# Patient Record
Sex: Female | Born: 1985 | Race: White | Hispanic: No | Marital: Married | Smoking: Current every day smoker
Health system: Southern US, Community
[De-identification: ages and names within clinical notes are randomized; demographics above are authoritative.]

## PROBLEM LIST (undated history)

## (undated) DIAGNOSIS — Z789 Other specified health status: Secondary | ICD-10-CM

## (undated) DIAGNOSIS — J4540 Moderate persistent asthma, uncomplicated: Secondary | ICD-10-CM

## (undated) DIAGNOSIS — F419 Anxiety disorder, unspecified: Secondary | ICD-10-CM

## (undated) DIAGNOSIS — D751 Secondary polycythemia: Secondary | ICD-10-CM

## (undated) DIAGNOSIS — J45909 Unspecified asthma, uncomplicated: Secondary | ICD-10-CM

## (undated) DIAGNOSIS — T7840XA Allergy, unspecified, initial encounter: Secondary | ICD-10-CM

## (undated) DIAGNOSIS — Z3009 Encounter for other general counseling and advice on contraception: Secondary | ICD-10-CM

## (undated) HISTORY — DX: Anxiety disorder, unspecified: F41.9

## (undated) HISTORY — DX: Unspecified asthma, uncomplicated: J45.909

## (undated) HISTORY — DX: Allergy, unspecified, initial encounter: T78.40XA

## (undated) HISTORY — PX: NO PAST SURGERIES: SHX2092

## (undated) HISTORY — DX: Secondary polycythemia: D75.1

## (undated) HISTORY — DX: Encounter for other general counseling and advice on contraception: Z30.09

## (undated) HISTORY — DX: Other specified health status: Z78.9

---
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
## 2013-05-22 ENCOUNTER — Encounter: Payer: Self-pay | Admitting: Women's Health

## 2013-07-10 ENCOUNTER — Ambulatory Visit (INDEPENDENT_AMBULATORY_CARE_PROVIDER_SITE_OTHER): Payer: BC Managed Care – PPO | Admitting: Family Medicine

## 2013-07-10 VITALS — BP 110/64 | HR 79 | Temp 97.9°F | Resp 18 | Ht 67.5 in | Wt 171.0 lb

## 2013-07-10 DIAGNOSIS — R52 Pain, unspecified: Secondary | ICD-10-CM

## 2013-07-10 DIAGNOSIS — L0231 Cutaneous abscess of buttock: Secondary | ICD-10-CM

## 2013-07-10 MED ORDER — DOXYCYCLINE HYCLATE 100 MG PO TABS: 100.0000 mg | ORAL_TABLET | Freq: Two times a day (BID) | ORAL | Status: DC

## 2013-07-10 NOTE — Progress Notes (Signed)
Procedure Note: Verbal consent obtained.  Local anesthesia with 2 cc 2% lidocaine  Small incision with 11 blade.  Small amount purulence expressed.  Culture collected Wound irrigated with remaining anesthetic.  No packing used.  Cleansed and dressed.  Discussed wound care.  Encouraged frequent warm compresses and dressing changes.

## 2013-07-10 NOTE — Patient Instructions (Addendum)
Abscess An abscess is an infected area that contains a collection of pus and debris.It can occur in almost any part of the body. An abscess is also known as a furuncle or boil. CAUSES  An abscess occurs when tissue gets infected. This can occur from blockage of oil or sweat glands, infection of hair follicles, or a minor injury to the skin. As the body tries to fight the infection, pus collects in the area and creates pressure under the skin. This pressure causes pain. People with weakened immune systems have difficulty fighting infections and get certain abscesses more often.  SYMPTOMS Usually an abscess develops on the skin and becomes a painful mass that is red, warm, and tender. If the abscess forms under the skin, you may feel a moveable soft area under the skin. Some abscesses break open (rupture) on their own, but most will continue to get worse without care. The infection can spread deeper into the body and eventually into the bloodstream, causing you to feel ill.  DIAGNOSIS  Your caregiver will take your medical history and perform a physical exam. A sample of fluid may also be taken from the abscess to determine what is causing your infection. TREATMENT  Your caregiver may prescribe antibiotic medicines to fight the infection. However, taking antibiotics alone usually does not cure an abscess. Your caregiver may need to make a small cut (incision) in the abscess to drain the pus. In some cases, gauze is packed into the abscess to reduce pain and to continue draining the area. HOME CARE INSTRUCTIONS   Only take over-the-counter or prescription medicines for pain, discomfort, or fever as directed by your caregiver.  If you were prescribed antibiotics, take them as directed. Finish them even if you start to feel better.  If gauze is used, follow your caregiver's directions for changing the gauze.  To avoid spreading the infection:  Keep your draining abscess covered with a  bandage.  Wash your hands well.  Do not share personal care items, towels, or whirlpools with others.  Avoid skin contact with others.  Keep your skin and clothes clean around the abscess.  Keep all follow-up appointments as directed by your caregiver. SEEK MEDICAL CARE IF:   You have increased pain, swelling, redness, fluid drainage, or bleeding.  You have muscle aches, chills, or a general ill feeling.  You have a fever. MAKE SURE YOU:   Understand these instructions.  Will watch your condition.  Will get help right away if you are not doing well or get worse. Document Released: 09/16/2005 Document Revised: 06/07/2012 Document Reviewed: 02/19/2012 Exit[employer] Patient Information 2014 [employer].  

## 2013-07-10 NOTE — Progress Notes (Signed)
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 She's had a history of staph infections in the past. She presents with about 5 days of progressive left gluteal pain and swelling consistent with staph infection. She also has a secondary satellite lesion 4 inches away. That was not as tender.  Her job as a Primary school teacher job.  Objective: 1 cm abscess left mid gluteal area. This is tender and appears to be ready to be lanced. He was awake 10 point area in the center of the erythematous swelling.  Assessment: Recurrent staph infections  Plan: Doxycycline 100 mg twice a day times 7 days, one refill progress signed, Sheila Oats.D.

## 2013-07-13 LAB — WOUND CULTURE: Gram Stain: NONE SEEN

## 2013-07-13 MED ORDER — CEPHALEXIN 500 MG PO CAPS: 500.0000 mg | ORAL_CAPSULE | Freq: Three times a day (TID) | ORAL | Status: AC

## 2013-07-13 NOTE — Addendum Note (Signed)
Addended by: Morrell Riddle on: 07/13/2013 08:28 PM   Modules accepted: Orders

## 2013-07-20 ENCOUNTER — Telehealth: Payer: Self-pay

## 2013-07-20 NOTE — Telephone Encounter (Signed)
Pt states that she is currently taking an antibiotic for a boil that she has, pt states that the antiobiotic is making her feel "odd" and "spacey". She would like to know if she should come in for this or should she just try a different medication. Best# [phone]

## 2013-07-20 NOTE — Telephone Encounter (Signed)
Please advise 

## 2013-07-21 NOTE — Telephone Encounter (Signed)
How does her wound look? It looks like she was changed from doxycycline to Keflex on 7/25 and she reported it was much better. Did she start the Keflex on 7/25? If so she has had 7 days of this and should be ok.  If it has completely resolved she can stop the antibiotic.

## 2013-07-22 NOTE — Telephone Encounter (Signed)
Spoke with patient and wound looks great. She did take 7-8 days worth of keflex so instructed her to d/c

## 2013-07-22 NOTE — Telephone Encounter (Signed)
Left message to return call 

## 2013-07-31 ENCOUNTER — Telehealth: Payer: Self-pay

## 2013-07-31 ENCOUNTER — Other Ambulatory Visit: Payer: Self-pay

## 2013-07-31 ENCOUNTER — Encounter (HOSPITAL_COMMUNITY): Payer: Self-pay | Admitting: Emergency Medicine

## 2013-07-31 ENCOUNTER — Emergency Department (HOSPITAL_COMMUNITY)
Admission: EM | Admit: 2013-07-31 | Discharge: 2013-08-01 | Disposition: A | Discharge status: Home / Self Care | Payer: BC Managed Care – PPO | Attending: Emergency Medicine | Admitting: Emergency Medicine

## 2013-07-31 ENCOUNTER — Ambulatory Visit: Payer: BC Managed Care – PPO

## 2013-07-31 ENCOUNTER — Emergency Department (HOSPITAL_COMMUNITY): Payer: BC Managed Care – PPO

## 2013-07-31 DIAGNOSIS — Z79899 Other long term (current) drug therapy: Secondary | ICD-10-CM | POA: Insufficient documentation

## 2013-07-31 DIAGNOSIS — R42 Dizziness and giddiness: Secondary | ICD-10-CM | POA: Insufficient documentation

## 2013-07-31 DIAGNOSIS — F411 Generalized anxiety disorder: Secondary | ICD-10-CM | POA: Insufficient documentation

## 2013-07-31 DIAGNOSIS — F419 Anxiety disorder, unspecified: Secondary | ICD-10-CM

## 2013-07-31 DIAGNOSIS — Z3202 Encounter for pregnancy test, result negative: Secondary | ICD-10-CM | POA: Insufficient documentation

## 2013-07-31 DIAGNOSIS — R112 Nausea with vomiting, unspecified: Secondary | ICD-10-CM | POA: Insufficient documentation

## 2013-07-31 DIAGNOSIS — I10 Essential (primary) hypertension: Secondary | ICD-10-CM | POA: Insufficient documentation

## 2013-07-31 DIAGNOSIS — F17200 Nicotine dependence, unspecified, uncomplicated: Secondary | ICD-10-CM | POA: Insufficient documentation

## 2013-07-31 LAB — CBC WITH DIFFERENTIAL/PLATELET
Basophils Absolute: 0 10*3/uL (ref 0.0–0.1)
Basophils Relative: 0 % (ref 0–1)
Eosinophils Absolute: 0.2 10*3/uL (ref 0.0–0.7)
Eosinophils Relative: 2 % (ref 0–5)
HCT: 48.3 % — ABNORMAL HIGH (ref 36.0–46.0)
Hemoglobin: 17.3 g/dL — ABNORMAL HIGH (ref 12.0–15.0)
Lymphocytes Relative: 28 % (ref 12–46)
Lymphs Abs: 3 10*3/uL (ref 0.7–4.0)
MCH: 33.5 pg (ref 26.0–34.0)
MCHC: 35.8 g/dL (ref 30.0–36.0)
MCV: 93.4 fL (ref 78.0–100.0)
Monocytes Absolute: 0.7 10*3/uL (ref 0.1–1.0)
Monocytes Relative: 7 % (ref 3–12)
Neutro Abs: 6.7 10*3/uL (ref 1.7–7.7)
Neutrophils Relative %: 63 % (ref 43–77)
Platelets: 255 10*3/uL (ref 150–400)
RBC: 5.17 MIL/uL — ABNORMAL HIGH (ref 3.87–5.11)
RDW: 13.7 % (ref 11.5–15.5)
WBC: 10.6 10*3/uL — ABNORMAL HIGH (ref 4.0–10.5)

## 2013-07-31 LAB — COMPREHENSIVE METABOLIC PANEL WITH GFR
ALT: 17 U/L (ref 0–35)
AST: 14 U/L (ref 0–37)
Albumin: 4 g/dL (ref 3.5–5.2)
Alkaline Phosphatase: 48 U/L (ref 39–117)
BUN: 12 mg/dL (ref 6–23)
CO2: 25 meq/L (ref 19–32)
Calcium: 9.6 mg/dL (ref 8.4–10.5)
Chloride: 101 meq/L (ref 96–112)
Creatinine, Ser: 0.68 mg/dL (ref 0.50–1.10)
GFR calc Af Amer: >90 mL/min
GFR calc non Af Amer: >90 mL/min
Glucose, Bld: 101 mg/dL — ABNORMAL HIGH (ref 70–99)
Potassium: 3.7 meq/L (ref 3.5–5.1)
Sodium: 138 meq/L (ref 135–145)
Total Bilirubin: 0.6 mg/dL (ref 0.3–1.2)
Total Protein: 7.3 g/dL (ref 6.0–8.3)

## 2013-07-31 LAB — POCT I-STAT TROPONIN I: Troponin i, poc: 0 ng/mL (ref 0.00–0.08)

## 2013-07-31 NOTE — ED Notes (Addendum)
Pt states she started taking antibiotics a couple weeks ago for a staph infection due to a boil on her buttocks and has since had moments of headaches and sharp pains in her head, back pain, nausea, and chest pain. These symptoms have now become constant.

## 2013-07-31 NOTE — ED Notes (Signed)
PT. REPORTS CHEST PAIN / HEAVINESS , DIZZINESS , NAUSEA AND VOMITTING BLOOD ONSET SEVERAL DAYS AGO , PT. ALSO REPORTS YEAST INFECTION AND ABSCESS AT LEFT BUTTOCKS .

## 2013-07-31 NOTE — ED Notes (Signed)
Patient transported to X-ray 

## 2013-07-31 NOTE — Telephone Encounter (Signed)
Patient states that she is having dizzy spells and panic attacks. Patient has never had this happened before and does not know if it is a result of her medication. [phone]

## 2013-07-31 NOTE — Telephone Encounter (Signed)
Patient should come in for this. Called her. Left message for her to call me back.

## 2013-07-31 NOTE — Telephone Encounter (Signed)
Spoke to her. She should come in or go to ER.

## 2013-08-01 LAB — URINALYSIS, ROUTINE W REFLEX MICROSCOPIC
Glucose, UA: NEGATIVE mg/dL
Protein, ur: NEGATIVE mg/dL
Specific Gravity, Urine: 1.024 (ref 1.005–1.030)
pH: 6 (ref 5.0–8.0)

## 2013-08-01 LAB — URINE MICROSCOPIC-ADD ON

## 2013-08-01 MED ORDER — LORAZEPAM 1 MG PO TABS: 1.0000 mg | ORAL_TABLET | Freq: Once | ORAL | Status: DC

## 2013-08-01 MED ORDER — DIPHENHYDRAMINE HCL 50 MG/ML IJ SOLN
12.5000 mg | Freq: Once | INTRAMUSCULAR | Status: AC
  Administered 2013-08-01: 12.5 mg via INTRAVENOUS
  Filled 2013-08-01: qty 1

## 2013-08-01 MED ORDER — SODIUM CHLORIDE 0.9 % IV BOLUS (SEPSIS)
1000.0000 mL | Freq: Once | INTRAVENOUS | Status: AC
  Administered 2013-08-01: 1000 mL via INTRAVENOUS

## 2013-08-01 MED ORDER — PROCHLORPERAZINE EDISYLATE 5 MG/ML IJ SOLN
10.0000 mg | Freq: Four times a day (QID) | INTRAMUSCULAR | Status: DC | PRN
  Filled 2013-08-01: qty 2

## 2013-08-01 MED ORDER — DIAZEPAM 5 MG/ML IJ SOLN
2.5000 mg | Freq: Once | INTRAMUSCULAR | Status: AC
  Administered 2013-08-01: 2.5 mg via INTRAVENOUS
  Filled 2013-08-01: qty 2

## 2013-08-01 MED ORDER — LORAZEPAM 1 MG PO TABS: 1.0000 mg | ORAL_TABLET | Freq: Three times a day (TID) | ORAL | Status: DC | PRN

## 2013-08-01 MED ORDER — KETOROLAC TROMETHAMINE 30 MG/ML IJ SOLN
15.0000 mg | Freq: Once | INTRAMUSCULAR | Status: AC
  Administered 2013-08-01: 15 mg via INTRAVENOUS
  Filled 2013-08-01: qty 1

## 2013-08-01 NOTE — ED Provider Notes (Signed)
CSN: [id number]     Arrival date & time 07/31/13  2133 History     First MD Initiated Contact with Patient 07/31/13 2336     Chief Complaint  Patient presents with  . Chest Pain   HPI Lindsey Jensen is a 27 y.o. female who presents with multiple complaints. She complains of intermittent, moderate, chest pain heaviness, associated with dizziness, nausea vomiting, no diarrhea, she says these are associated with a rapid heartbeat, no numbness or tingling.  She feels stressed at the moment with a new job she recently moved here from [place].  She has not taken anything for it, there are no other alleviating or exacerbating factors.   Past Medical History  Diagnosis Date  . Hypertension    History reviewed. No pertinent past surgical history. No family history on file. History  Substance Use Topics  . Smoking status: Current Every Day Smoker -- 1.00 packs/day  . Smokeless tobacco: Not on file  . Alcohol Use: Yes   OB History   Grav Para Term Preterm Abortions TAB SAB Ect Mult Living                 Review of Systems At least 10pt or greater review of systems completed and are negative except where specified in the HPI.  Allergies  Peach flavor and Peanut-containing drug products  Home Medications   Current Outpatient Rx  Name  Route  Sig  Dispense  Refill  . ibuprofen (ADVIL,MOTRIN) 200 MG tablet   Oral   Take 800 mg by mouth daily as needed for pain.         Marland Kitchen norethindrone-ethinyl estradiol-iron (ESTROSTEP FE,TILIA FE,TRI-LEGEST FE) 1-20/1-30/1-35 MG-MCG tablet   Oral   Take 1 tablet by mouth daily.         Marland Kitchen LORazepam (ATIVAN) 1 MG tablet   Oral   Take 1 tablet (1 mg total) by mouth 3 (three) times daily as needed for anxiety.   15 tablet   0    BP 87/43  Pulse 43  Temp(Src) 98.2 F (36.8 C) (Oral)  Resp 26  SpO2 98%  LMP 07/30/2013 Physical Exam  Nursing notes reviewed.  Electronic medical record reviewed. VITAL SIGNS:   Filed Vitals:   08/01/13  0130 08/01/13 0145 08/01/13 0200 08/01/13 0213  BP: 99/64 97/43 87/43    Pulse: 78 44 43   Temp:    98.2 F (36.8 C)  TempSrc:    Oral  Resp:      SpO2: 99% 99% 98%    CONSTITUTIONAL: Awake, oriented, appears non-toxic HENT: Atraumatic, normocephalic, oral mucosa pink and moist, airway patent. Nares patent without drainage. External ears normal. EYES: Conjunctiva clear, EOMI, PERRLA NECK: Trachea midline, non-tender, supple CARDIOVASCULAR: Normal heart rate, Normal rhythm, No murmurs, rubs, gallops PULMONARY/CHEST: Clear to auscultation, no rhonchi, wheezes, or rales. Symmetrical breath sounds. Non-tender. ABDOMINAL: Non-distended, soft, non-tender - no rebound or guarding.  BS normal. NEUROLOGIC: Non-focal, moving all four extremities, no gross sensory or motor deficits. EXTREMITIES: No clubbing, cyanosis, or edema SKIN: Warm, Dry, No erythema, No rash  ED Course   Procedures (including critical care time)  Labs Reviewed  URINALYSIS, ROUTINE W REFLEX MICROSCOPIC - Abnormal; Notable for the following:    APPearance CLOUDY (*)    Hgb urine dipstick SMALL (*)    Leukocytes, UA TRACE (*)    All other components within normal limits  CBC WITH DIFFERENTIAL - Abnormal; Notable for the following:    WBC 10.6 (*)  RBC 5.17 (*)    Hemoglobin 17.3 (*)    HCT 48.3 (*)    All other components within normal limits  COMPREHENSIVE METABOLIC PANEL - Abnormal; Notable for the following:    Glucose, Bld 101 (*)    All other components within normal limits  URINE MICROSCOPIC-ADD ON - Abnormal; Notable for the following:    Squamous Epithelial / LPF FEW (*)    Crystals CA OXALATE CRYSTALS (*)    All other components within normal limits  POCT PREGNANCY, URINE  POCT I-STAT TROPONIN I   Dg Chest 2 View  07/31/2013   *RADIOLOGY REPORT*  Clinical Data: Chest pain.  CHEST - 2 VIEW  Comparison: No priors.  Findings: Lung volumes are normal.  No consolidative airspace disease.  No pleural  effusions.  No pneumothorax.  No pulmonary nodule or mass noted.  Pulmonary vasculature and the cardiomediastinal silhouette are within normal limits.  IMPRESSION: 1. No radiographic evidence of acute cardiopulmonary disease.   Original Report Authenticated By: Trudie Reed, M.D.   1. Anxiety     MDM  Pt w/ anxiety, treated with ativan.  Doubt ACS, PERC neg, she is non-toxic and afebrile. The patient appears reasonably screened and/or stabilized for discharge and I doubt any other medical condition or other [facility] requiring further screening, evaluation, or treatment in the ED exists or is present at this time prior to discharge.   Jones Skene, MD 08/01/13 [phone]

## 2013-08-23 ENCOUNTER — Ambulatory Visit: Payer: BC Managed Care – PPO | Attending: Internal Medicine | Admitting: Internal Medicine

## 2013-08-23 ENCOUNTER — Encounter: Payer: Self-pay | Admitting: Internal Medicine

## 2013-08-23 VITALS — BP 127/83 | HR 86 | Temp 98.1°F | Resp 16 | Ht 68.9 in | Wt 173.0 lb

## 2013-08-23 DIAGNOSIS — F411 Generalized anxiety disorder: Secondary | ICD-10-CM | POA: Insufficient documentation

## 2013-08-23 MED ORDER — BACITRACIN 500 UNIT/GM EX OINT: 1.0000 "application " | TOPICAL_OINTMENT | Freq: Two times a day (BID) | CUTANEOUS | Status: DC

## 2013-08-23 MED ORDER — CHLORHEXIDINE GLUCONATE CLOTH 2 % EX PADS: 6.0000 | MEDICATED_PAD | Freq: Every day | CUTANEOUS | Status: DC

## 2013-08-23 MED ORDER — MUPIROCIN 2 % EX OINT: TOPICAL_OINTMENT | Freq: Three times a day (TID) | CUTANEOUS | Status: DC

## 2013-08-23 NOTE — Progress Notes (Unsigned)
Patient ID: Lindsey Jensen, female   DOB: November 01, 1986, 27 y.o.   MRN: [id number]  Patient Demographics  Lindsey Jensen, is a 27 y.o. female  CSN: [id number]  MRN: [id number]  DOB - January 17, 1986  Outpatient Primary MD for the patient is Jeanann Lewandowsky, MD   With History of -  Past Medical History  Diagnosis Date  . Hypertension       History reviewed. No pertinent past surgical history.  in for   Chief Complaint  Patient presents with  . Establish Care     HPI  Lindsey Jensen  is a 27 y.o. female, with no health issues except taking oral contraceptive pills, she went to the ER recently for anxiety episode which he attributes to taking the oral contraceptive pills, she subsequently was told by a family member to try over-the-counter vitamin B12 supplements for anxiety which she has tried with good effect, she currently has no anxiety. She continues to take the oral contraceptive pills unchanged. She also complains of recurrent staph infections with 2 recent abscess being drained over the last few months. Currently no active problem.    Review of Systems  currently negative review of systems  In addition to the HPI above,   No Fever-chills, No Headache, No changes with Vision or hearing, No problems swallowing food or Liquids, No Chest pain, Cough or Shortness of Breath, No Abdominal pain, No Nausea or Vommitting, Bowel movements are regular, No Blood in stool or Urine, No dysuria, No new skin rashes or bruises, No new joints pains-aches,  No new weakness, tingling, numbness in any extremity, No recent weight gain or loss, No polyuria, polydypsia or polyphagia, No significant Mental Stressors.  A full 10 point Review of Systems was done, except as stated above, all other Review of Systems were negative.   Social History History  Substance Use Topics  . Smoking status: Current Every Day Smoker -- 1.00 packs/day  . Smokeless tobacco: Not on file  . Alcohol Use: Yes       Family History Family History  Problem Relation Age of Onset  . Heart disease Father       Prior to Admission medications   Medication Sig Start Date End Date Taking? Authorizing Provider  ibuprofen (ADVIL,MOTRIN) 200 MG tablet Take 800 mg by mouth daily as needed for pain.   Yes Historical Provider, MD  norethindrone-ethinyl estradiol-iron (ESTROSTEP FE,TILIA FE,TRI-LEGEST FE) 1-20/1-30/1-35 MG-MCG tablet Take 1 tablet by mouth daily.   Yes Historical Provider, MD  bacitracin 500 UNIT/GM ointment Apply 1 application topically 2 (two) times daily. Both nares 08/23/13   Leroy Sea, MD  Chlorhexidine Gluconate Cloth 2 % PADS Apply 6 each topically daily at 6 (six) AM. 08/23/13   Leroy Sea, MD  LORazepam (ATIVAN) 1 MG tablet Take 1 tablet (1 mg total) by mouth 3 (three) times daily as needed for anxiety. 08/01/13   Jones Skene, MD    Allergies  Allergen Reactions  . Peach Flavor Anaphylaxis  . Peanut-Containing Drug Products Anaphylaxis    almonds    Physical Exam  Vitals  Blood pressure 127/83, pulse 86, temperature 98.1 F (36.7 C), temperature source Oral, resp. rate 16, height 5' 8.9" (1.75 m), weight 173 lb (78.472 kg), last menstrual period 08/22/2013, SpO2 100.00%.   1. General Young white female sitting on clinic examination table in no apparent distress,     2. Dramatic affect and insight, Not Suicidal or Homicidal, Awake Alert, Oriented X 3.  3. No  F.N deficits, ALL C.Nerves Intact, Strength 5/5 all 4 extremities, Sensation intact all 4 extremities, Plantars down going.  4. Ears and Eyes appear Normal, Conjunctivae clear, PERRLA. Moist Oral Mucosa.  5. Supple Neck, No JVD, No cervical lymphadenopathy appriciated, No Carotid Bruits.  6. Symmetrical Chest wall movement, Good air movement bilaterally, CTAB.  7. RRR, No Gallops, Rubs or Murmurs, No Parasternal Heave.  8. Positive Bowel Sounds, Abdomen Soft, Non tender, No organomegaly appriciated,No  rebound -guarding or rigidity.  9.  No Cyanosis, Normal Skin Turgor, No Skin Rash or Bruise.  10. Good muscle tone,  joints appear normal , no effusions, Normal ROM.  11. No Palpable Lymph Nodes in Neck or Axillae     Data Review  CBC No results found for this basename: WBC, HGB, HCT, PLT, MCV, MCH, MCHC, RDW, NEUTRABS, LYMPHSABS, MONOABS, EOSABS, BASOSABS, BANDABS, BANDSABD,  in the last 168 hours ------------------------------------------------------------------------------------------------------------------  Chemistries  No results found for this basename: NA, K, CL, CO2, GLUCOSE, BUN, CREATININE, GFRCGP, CALCIUM, MG, AST, ALT, ALKPHOS, BILITOT,  in the last 168 hours ------------------------------------------------------------------------------------------------------------------ estimated creatinine clearance is 113 ml/min (by C-G formula based on Cr of 0.68). ------------------------------------------------------------------------------------------------------------------ No results found for this basename: TSH, T4TOTAL, FREET3, T3FREE, THYROIDAB,  in the last 72 hours   Coagulation profile No results found for this basename: INR, PROTIME,  in the last 168 hours ------------------------------------------------------------------------------------------------------------------- No results found for this basename: DDIMER,  in the last 72 hours -------------------------------------------------------------------------------------------------------------------  Cardiac Enzymes No results found for this basename: CK, CKMB, TROPONINI, MYOGLOBIN,  in the last 168 hours ------------------------------------------------------------------------------------------------------------------ No components found with this basename: POCBNP,    ---------------------------------------------------------------------------------------------------------------  Urinalysis    Component Value  Date/Time   COLORURINE YELLOW 08/01/2013 0006   APPEARANCEUR CLOUDY* 08/01/2013 0006   LABSPEC 1.024 08/01/2013 0006   PHURINE 6.0 08/01/2013 0006   GLUCOSEU NEGATIVE 08/01/2013 0006   HGBUR SMALL* 08/01/2013 0006   BILIRUBINUR NEGATIVE 08/01/2013 0006   KETONESUR NEGATIVE 08/01/2013 0006   PROTEINUR NEGATIVE 08/01/2013 0006   UROBILINOGEN 0.2 08/01/2013 0006   NITRITE NEGATIVE 08/01/2013 0006   LEUKOCYTESUR TRACE* 08/01/2013 0006       Assessment and plan  1. General anxiety which is much better. She's not suicidal homicidal, her symptoms are much better since she is taking over-the-counter B12 supplement likely possible, she has been given one time outpatient referred to psychiatry, she's not anxious at this time, she has been given instructions to call 911 or to seek immediate medical attention if she gets another anxiety attack and future. Check TSH.    2. Recurrent staph infections 3 in the last few months, have placed her on bacitracin ointment for her naris along with chlorhexidine wipes for 5 days. No active infection. Will check baseline A1c.    3. Smoking counseled to quit smoking      Pap smear - referral made    Immunizations Flu shot  - patient refused        Leroy Sea M.D on 08/23/2013 at 5:36 PM

## 2013-08-23 NOTE — Progress Notes (Unsigned)
Pt is here to establish care Pt reports that she has been having anxiety and depression.

## 2013-08-24 LAB — TSH: TSH: 1.38 u[IU]/mL (ref 0.350–4.500)

## 2013-08-24 LAB — HEMOGLOBIN A1C: Hgb A1c MFr Bld: 5.7 % — ABNORMAL HIGH (ref <5.7)

## 2013-08-28 NOTE — Progress Notes (Signed)
Quick Note:  Patient has prediabetes please call her in in one to 2 weeks for a repeat visit ______

## 2013-08-29 ENCOUNTER — Telehealth: Payer: Self-pay | Admitting: Emergency Medicine

## 2013-08-29 NOTE — Telephone Encounter (Signed)
Message copied by Darlis Loan on Tue Aug 29, 2013 10:42 AM ------      Message from: [facility]      Created: Mon Aug 28, 2013  4:45 PM       Patient has prediabetes please call her in in one to 2 weeks for a repeat visit ------

## 2013-08-29 NOTE — Telephone Encounter (Signed)
Left message with pt to schedule 2 week appt for repeat blood sugar per Dr. Thedore Mins

## 2013-08-29 NOTE — Telephone Encounter (Signed)
Spoke with pt regarding lab results. Office visit scheduled on 09/12/13 @ 5pm for office visit per Dr. Thedore Mins

## 2013-09-12 ENCOUNTER — Ambulatory Visit: Payer: BC Managed Care – PPO | Attending: Internal Medicine

## 2013-09-12 DIAGNOSIS — R7303 Prediabetes: Secondary | ICD-10-CM

## 2013-09-12 DIAGNOSIS — R7309 Other abnormal glucose: Secondary | ICD-10-CM

## 2013-09-28 ENCOUNTER — Emergency Department (HOSPITAL_COMMUNITY)
Admission: EM | Admit: 2013-09-28 | Discharge: 2013-09-29 | Disposition: A | Discharge status: Home / Self Care | Payer: BC Managed Care – PPO | Attending: Emergency Medicine | Admitting: Emergency Medicine

## 2013-09-28 DIAGNOSIS — F17200 Nicotine dependence, unspecified, uncomplicated: Secondary | ICD-10-CM | POA: Insufficient documentation

## 2013-09-28 DIAGNOSIS — Z79899 Other long term (current) drug therapy: Secondary | ICD-10-CM | POA: Insufficient documentation

## 2013-09-28 DIAGNOSIS — R1013 Epigastric pain: Secondary | ICD-10-CM | POA: Insufficient documentation

## 2013-09-28 DIAGNOSIS — I10 Essential (primary) hypertension: Secondary | ICD-10-CM | POA: Insufficient documentation

## 2013-09-28 DIAGNOSIS — R079 Chest pain, unspecified: Secondary | ICD-10-CM | POA: Insufficient documentation

## 2013-09-28 LAB — POCT I-STAT TROPONIN I: Troponin i, poc: 0 ng/mL (ref 0.00–0.08)

## 2013-09-28 LAB — CBC
HCT: 40.2 % (ref 36.0–46.0)
MCHC: 36.1 g/dL — ABNORMAL HIGH (ref 30.0–36.0)
MCV: 94.1 fL (ref 78.0–100.0)
RDW: 13.1 % (ref 11.5–15.5)

## 2013-09-28 LAB — POCT I-STAT, CHEM 8
Calcium, Ion: 1.25 mmol/L — ABNORMAL HIGH (ref 1.12–1.23)
Chloride: 100 mEq/L (ref 96–112)
Glucose, Bld: 102 mg/dL — ABNORMAL HIGH (ref 70–99)
HCT: 43 % (ref 36.0–46.0)
Hemoglobin: 14.6 g/dL (ref 12.0–15.0)

## 2013-09-28 NOTE — ED Notes (Signed)
C/o epigastric pain x 1 week ago - no relief with tum's. Now, the pain has radiated up chest - lt. Hand twitching, something pushing on chest.

## 2013-09-28 NOTE — ED Notes (Signed)
Pt states that she has been having chest pain with bilateral arm spasms for one week. Pt states that she was having indigestion and took several different over the counter medications and nothing worked.

## 2013-09-29 LAB — D-DIMER, QUANTITATIVE: D-Dimer, Quant: <0.27 ug/mL-FEU (ref 0.00–0.48)

## 2013-09-29 MED ORDER — FENTANYL CITRATE 0.05 MG/ML IJ SOLN: 50.0000 ug | INTRAMUSCULAR | Status: DC | PRN

## 2013-09-29 MED ORDER — GI COCKTAIL ~~LOC~~
30.0000 mL | Freq: Once | ORAL | Status: AC
  Administered 2013-09-29: 30 mL via ORAL
  Filled 2013-09-29: qty 30

## 2013-09-29 MED ORDER — RANITIDINE HCL 150 MG PO CAPS: 150.0000 mg | ORAL_CAPSULE | Freq: Every day | ORAL | Status: DC

## 2013-09-29 MED ORDER — ONDANSETRON HCL 4 MG/2ML IJ SOLN: 4.0000 mg | Freq: Once | INTRAMUSCULAR | Status: DC

## 2013-09-29 MED ORDER — PANTOPRAZOLE SODIUM 40 MG PO TBEC
40.0000 mg | DELAYED_RELEASE_TABLET | Freq: Once | ORAL | Status: AC
  Administered 2013-09-29: 40 mg via ORAL
  Filled 2013-09-29: qty 1

## 2013-09-29 MED ORDER — FAMOTIDINE 20 MG PO TABS
20.0000 mg | ORAL_TABLET | Freq: Once | ORAL | Status: AC
  Administered 2013-09-29: 20 mg via ORAL
  Filled 2013-09-29: qty 1

## 2013-09-29 NOTE — ED Notes (Signed)
Pt refused fentanyl and zofran which were ordered by Dr. Dierdre Highman. Pt also refused IV start.

## 2013-09-29 NOTE — ED Provider Notes (Signed)
CSN: [id number]     Arrival date & time 09/28/13  2245 History   First MD Initiated Contact with Patient 09/29/13 0123     Chief Complaint  Patient presents with  . Chest Pain   (Consider location/radiation/quality/duration/timing/severity/associated sxs/prior Treatment) HPI History provided by the patient. Epigastric pain ongoing for about a week. Onset in the middle of night, describes symptoms as feels like I swallowed something that got stuck, pressure-like in quality. Patient was recently started on birth control pills and is a smoker. She denies any leg pain or leg swelling. No history of DVT or PE. Her pain radiates somewhat to her substernal region. She denies any shortness of breath. She complains of a metallic taste in her mouth but denies any reflux or heartburn otherwise. Symptoms moderate in severity, not relieved by TUMS.  No history of same otherwise. Past Medical History  Diagnosis Date  . Hypertension    No past surgical history on file. Family History  Problem Relation Age of Onset  . Heart disease Father    History  Substance Use Topics  . Smoking status: Current Every Day Smoker -- 1.00 packs/day  . Smokeless tobacco: Not on file  . Alcohol Use: Yes   OB History   Grav Para Term Preterm Abortions TAB SAB Ect Mult Living                 Review of Systems  Constitutional: Negative for fever and chills.  Eyes: Negative for pain.  Respiratory: Negative for shortness of breath.   Cardiovascular: Negative for leg swelling.  Gastrointestinal: Positive for abdominal pain. Negative for nausea and vomiting.  Genitourinary: Negative for dysuria.  Musculoskeletal: Negative for back pain, neck pain and neck stiffness.  Skin: Negative for rash.  Neurological: Negative for headaches.  All other systems reviewed and are negative.    Allergies  Peach flavor and Peanut-containing drug products  Home Medications   Current Outpatient Rx  Name  Route  Sig  Dispense   Refill  . ibuprofen (ADVIL,MOTRIN) 200 MG tablet   Oral   Take 800 mg by mouth daily as needed for pain.         Marland Kitchen norethindrone-ethinyl estradiol-iron (ESTROSTEP FE,TILIA FE,TRI-LEGEST FE) 1-20/1-30/1-35 MG-MCG tablet   Oral   Take 1 tablet by mouth daily.          BP 136/92  Pulse 94  Temp(Src) 98.4 F (36.9 C) (Oral)  Resp 14  Ht 5\' 8"  (1.727 m)  Wt 170 lb (77.111 kg)  BMI 25.85 kg/m2  SpO2 97%  LMP 09/14/2013 Physical Exam  Constitutional: She is oriented to person, place, and time. She appears well-developed and well-nourished.  HENT:  Head: Normocephalic and atraumatic.  Eyes: EOM are normal. Pupils are equal, round, and reactive to light.  Neck: Neck supple.  Cardiovascular: Normal rate, regular rhythm and intact distal pulses.   Pulmonary/Chest: Effort normal and breath sounds normal. No respiratory distress. She exhibits no tenderness.  Abdominal: Soft. She exhibits no mass. There is no rebound and no guarding.  Epigastric tenderness. No right upper quadrant tenderness and negative Murphy's sign. No CVA tenderness.  Musculoskeletal: Normal range of motion. She exhibits no edema.  No calf tenderness  Neurological: She is alert and oriented to person, place, and time.  Skin: Skin is warm and dry.  Psychiatric:  Moderate anxiety    ED Course  Procedures (including critical care time) Labs Review Labs Reviewed  CBC - Abnormal; Notable for the following:  WBC 11.1 (*)    MCHC 36.1 (*)    All other components within normal limits  POCT I-STAT, CHEM 8 - Abnormal; Notable for the following:    Glucose, Bld 102 (*)    Calcium, Ion 1.25 (*)    All other components within normal limits  D-DIMER, QUANTITATIVE  POCT I-STAT TROPONIN I    Date: 09/29/2013  Rate: 98  Rhythm: normal sinus rhythm  QRS Axis: normal  Intervals: normal  ST/T Wave abnormalities: nonspecific ST changes  Conduction Disutrbances:none  Narrative Interpretation:   Old EKG Reviewed: No  significant changes from previous   GI cocktail, Pepcid or Protonix provided Patient declines any narcotic pain medications as she plans to go to work this morning  Results as above shared with patient.  Her abdominal exam remains benign and there is no indication for emergent imaging at this time. Plan outpatient followup with GI referral provided. Patient agrees to take Zantac daily. Abdominal pain and chest pain precautions verbalized as understood. Patient does have a primary care physician and agrees to followup in the office as well  MDM  Diagnosis: Epigastric pain, chest pain  Previous records reviewed with history of anxiety EKG, labs reviewed as above Medications provided Vital signs and nursing notes reviewed and considered    Sunnie Nielsen, MD 09/29/13 0410

## 2013-10-13 ENCOUNTER — Encounter: Payer: BC Managed Care – PPO | Admitting: Family Medicine

## 2013-10-26 ENCOUNTER — Other Ambulatory Visit: Payer: Self-pay

## 2013-11-08 ENCOUNTER — Other Ambulatory Visit: Payer: Self-pay | Admitting: Gastroenterology

## 2013-11-08 DIAGNOSIS — R1013 Epigastric pain: Secondary | ICD-10-CM

## 2013-11-14 ENCOUNTER — Ambulatory Visit (INDEPENDENT_AMBULATORY_CARE_PROVIDER_SITE_OTHER): Payer: BC Managed Care – PPO | Admitting: Women's Health

## 2013-11-14 ENCOUNTER — Other Ambulatory Visit (HOSPITAL_COMMUNITY)
Admission: RE | Admit: 2013-11-14 | Discharge: 2013-11-14 | Disposition: A | Discharge status: Home / Self Care | Payer: BC Managed Care – PPO | Source: Ambulatory Visit | Attending: Obstetrics & Gynecology | Admitting: Obstetrics & Gynecology

## 2013-11-14 ENCOUNTER — Encounter: Payer: Self-pay | Admitting: Women's Health

## 2013-11-14 VITALS — BP 130/72 | Ht 68.0 in | Wt 170.8 lb

## 2013-11-14 DIAGNOSIS — Z01419 Encounter for gynecological examination (general) (routine) without abnormal findings: Secondary | ICD-10-CM

## 2013-11-14 DIAGNOSIS — Z309 Encounter for contraceptive management, unspecified: Secondary | ICD-10-CM

## 2013-11-14 MED ORDER — NORETHIN ACE-ETH ESTRAD-FE 1.5-30 MG-MCG PO TABS: 1.0000 | ORAL_TABLET | Freq: Every day | ORAL | Status: DC

## 2013-11-14 NOTE — Progress Notes (Signed)
Patient ID: Lindsey Jensen, female   DOB: 07-18-1986, 27 y.o.   MRN: [id number] Subjective:     Lindsey Jensen is a 27 y.o. G0 [place] female here for a routine well-woman exam.  Patient's last menstrual period was 11/14/2013.  Current complaints: anxiety, intermittent crampy, sometimes sharp Lt ches/breast pain that radiates into Lt shoulder, and HA since beginning [facility]s [facility]. Denies sob, tachycardia, palpitations, diaphoresis, n/v. States she had previously been on microgestin Fe and had done great on that, but pharmacy told her they didn't have it, and Junel was closest thing to it. She does not want to switch to different method of contraception. Went to ED 09/29/13 w/ same complaints, had normal cardio work-up, pt states she was given antidepressant for anxiety, but she did not get it filled b/c she doesn't feel like that's what's causing her sx. She then went to GI in [place], given Dexilant for GERD, which she has been on x 1wk w/o resolution in sx. Does drink large amounts of caffeine. Strong family h/o anxiety per pt report.  May start trying to conceive after wedding, date hasn't been set yet.   She recently got engaged, is in mutually monogamous relationship x 8yr. Denies STI screening today.   Smoking Status: a little <1ppd x 38yr, knows it is bad for her, tried to quit once, but gained 60lb, so she doesn't want to quit at this time. Discussed increased r/f DVT/PE w/ COC and smoking.   Does not desire labs, just recently had annual labs at work, all wnl except LDL or HDL that she states was 'a little off', and they told her it could be d/t being on COCs  Personal health questionnaire reviewed: yes.   Gynecologic History Patient's last menstrual period was 11/14/2013. Contraception: OCP (estrogen/progesterone) Last Pap: unsure. Results were: normal Last mammogram: never. Results were: n/a  Obstetric History OB History  No data available     The following  portions of the patient's history were reviewed and updated as appropriate: allergies, current medications, past family history, past medical history, past social history, past surgical history and problem list.  Review of Systems  Review of Systems  Constitutional: Negative for fever, chills, weight loss, malaise/fatigue and diaphoresis.  HENT: Negative for hearing loss, ear pain, nosebleeds, congestion, sore throat, neck       pain, tinnitus and ear discharge.   Eyes: Negative for blurred vision, double vision, photophobia, pain, discharge and       redness.  Respiratory: Negative for cough, hemoptysis, sputum production, shortness of breath,       wheezing and stridor.   Cardiovascular: Negative for palpitations, orthopnea, claudication, leg       swelling and PND. Pos for intermittent crampy, sometimes sharp pain behind Lt breast/chest, that occ radiates to Lt shoulder since ~June Gastrointestinal: negative for abdominal pain. Negative for heartburn, nausea, vomiting,       diarrhea, constipation, blood in stool and melena.  Genitourinary: Negative for dysuria, urgency, frequency, hematuria, flank pain,       incontinence, and dyspareunia.  Musculoskeletal: Negative for myalgias, back pain, joint pain and falls.  Skin: Negative for itching and rash.  Neurological: Negative for dizziness, tingling, tremors, sensory change, speech change,     focal weakness, seizures, loss of consciousness, weakness and headaches.  Endo/Heme/Allergies: Negative for environmental allergies and polydipsia. Does not       bruise/bleed easily.  Psychiatric/Behavioral: Negative for depression, suicidal ideas, hallucinations, memory  loss and substance abuse. The patient is not nervous/anxious and does not have       insomnia.        Objective:     Physical Exam  BP 130/72  Ht 5\' 8"  (1.727 m)  Wt 170 lb 12.8 oz (77.474 kg)  BMI 25.98 kg/m2  LMP 11/14/2013 Constitutional: She is oriented to  person, place, and time. She appears well-developed    and well-nourished.  HEENT:     Head: Normocephalic and atraumatic.           Right Ear: External ear normal.     Left Ear: External ear normal.     Nose: Nose normal.     Mouth/Throat: Oropharynx is clear and moist.     Eyes: Conjunctivae and EOM are normal. Pupils are equal, round, and reactive to light.    Right eye exhibits no discharge. Left eye exhibits no discharge. No scleral icterus.  Neck: Normal range of motion. Neck supple. No tracheal deviation present. No          thyromegaly present.  Cardiovascular: Normal rate, regular rhythm, normal heart sounds and intact distal          pulses.  Exam reveals no gallop and no friction rub.  No murmur heard. Respiratory: Effort normal and breath sounds normal. No respiratory distress. She has       no wheezes. She has no rales. She exhibits no tenderness.  Breasts: no dominate palpable mass, retraction or nipple discharge  GI: Soft. Bowel sounds are normal. She exhibits no distension and no mass. There is no    tenderness. There is no rebound and no guarding.     Hemoccult: n/a Genitourinary:     Vulva is normal without lesions    Vagina is pink moist without discharge    Cervix normal in appearance and thin prep pap is done    Uterus is normal size shape and contour    Adnexa is negative with normal sized ovaries   Musculoskeletal: Normal range of motion. She exhibits no edema and no tenderness.  Neurological: She is alert and oriented to person, place, and time. She has normal    reflexes. She displays normal reflexes. No cranial nerve deficit. She exhibits normal    muscle tone. Coordination normal.  Skin: Skin is warm and dry. No rash noted. No erythema. No pallor.  Psychiatric: She has a normal mood and affect. Her behavior is normal. Judgment and    thought content normal.       Assessment:     Healthy well-woman exam Anxiety, Lt sided chest/breast pain, and HA since  June Contraception management Smoker GERD on dexilant     Plan:  COC changed to Rx microgestin Fe 1 pack w/ 11RF To decrease caffeine Encouraged smoking cessation Continue dexilant F/U to see how switch in COCs is working, checkup on sx Mammogram @ 25yo or sooner if problems Colonoscopy @ 25yo or sooner if problems Discussed starting pnv, smoking cessation prior to trying to conceive  Marge Duncans 11/14/2013 5:03 PM

## 2013-11-14 NOTE — Patient Instructions (Signed)
Oral Contraception Use Oral contraceptive pills (OCPs) are medicines taken to prevent pregnancy. OCPs work by preventing the ovaries from releasing eggs. The hormones in OCPs also cause the cervical mucus to thicken, preventing the sperm from entering the uterus. The hormones also cause the uterine lining to become thin, not allowing a fertilized egg to attach to the inside of the uterus. OCPs are highly effective when taken exactly as prescribed. However, OCPs do not prevent sexually transmitted diseases (STDs). Safe sex practices, such as using condoms along with an OCP, can help prevent STDs. Before taking OCPs, you may have a physical exam and Pap test. Your health care provider may also order blood tests if necessary. Your health care provider will make sure you are a good candidate for oral contraception. Discuss with your health care provider the possible side effects of the OCP you may be prescribed. When starting an OCP, it can take 2 to 3 months for the body to adjust to the changes in hormone levels in your body.  HOW TO TAKE ORAL CONTRACEPTIVE PILLS Your health care provider may advise you on how to start taking the first cycle of OCPs. Otherwise, you can:  Start on day 1 of your menstrual period. You will not need any backup contraceptive protection with this start time.  Start on the first Sunday after your menstrual period or the day you get your prescription. In these cases, you will need to use backup contraceptive protection for the first week.  Start the pill at any time of your cycle. If you take the pill within 5 days of the start of your period, you are protected against pregnancy right away. In this case, you will not need a backup form of birth control. If you start at any other time of your menstrual cycle, you will need to use another form of birth control for 7 days. If your OCP is the type called a minipill, it will protect you from pregnancy after taking it for 2 days (48  hours). After you have started taking OCPs:  If you forget to take 1 pill, take it as soon as you remember. Take the next pill at the regular time.  If you miss 2 or more pills, call your health care provider because different pills have different instructions for missed doses. Use backup birth control until your next menstrual period starts.  If you use a 28-day pack that contains inactive pills and you miss 1 of the last 7 pills (pills with no hormones), it will not matter. Throw away the rest of the nonhormone pills and start a new pill pack.  No matter which day you start the OCP, you will always start a new pack on that same day of the week. Have an extra pack of OCPs and a backup contraceptive method available in case you miss some pills or lose your OCP pack.  HOME CARE INSTRUCTIONS  Do not smoke.  Always use a condom to protect against STDs. OCPs do not protect against STDs.  Use a calendar to mark your menstrual period days.  Read the information and directions that came with your OCP. Talk to your health care provider if you have questions.  SEEK MEDICAL CARE IF:  You develop nausea and vomiting.  You have abnormal vaginal discharge or bleeding.  You develop a rash.  You miss your menstrual period.  You are losing your hair.  You need treatment for mood swings or depression.  You get dizzy  when taking the OCP.  You develop acne from taking the OCP.  You become pregnant.  SEEK IMMEDIATE MEDICAL CARE IF:  You develop chest pain.  You develop shortness of breath.  You have an uncontrolled or severe headache.  You develop numbness or slurred speech.  You develop visual problems.  You develop pain, redness, and swelling in the legs.  Document Released: 11/26/2011 Document Revised: 08/09/2013 Document Reviewed: 05/28/2013 [facility] Patient Information 2014 [place], [place].  Decreases premenstrual symptoms.  Treats menstrual period cramps.  Regulates the  menstrual cycle.  Decreases a heavy menstrual flow.  Treats acne.  Treats abnormal uterine bleeding.  Treats chronic pelvic pain.  Treats polycystic ovarian syndrome.  Treats endometriosis.  Can be used as emergency contraception. DISADVANTAGES OCs can be less effective if:  You forget to take the pill at the same time every day.  You have a stomach or intestinal disease that lessens the absorption of the pill.  You take OCs with other medicines that make OCs less effective.  You take expired OCs.  You forget to restart the pill on day 7, when using the packs of 21 pills. Document Released: 02/27/2003 Document Revised: 02/29/2012 Document Reviewed: 05/28/2013 [facility] Patient Information 2014 [place], [place].

## 2013-11-28 ENCOUNTER — Ambulatory Visit (HOSPITAL_COMMUNITY): Payer: BC Managed Care – PPO

## 2014-01-15 ENCOUNTER — Ambulatory Visit: Payer: BC Managed Care – PPO | Admitting: Women's Health

## 2014-01-26 ENCOUNTER — Ambulatory Visit (INDEPENDENT_AMBULATORY_CARE_PROVIDER_SITE_OTHER): Payer: BC Managed Care – PPO | Admitting: Adult Health

## 2014-01-26 ENCOUNTER — Encounter: Payer: Self-pay | Admitting: Adult Health

## 2014-01-26 VITALS — BP 138/80 | Ht 68.5 in | Wt 169.0 lb

## 2014-01-26 DIAGNOSIS — Z0000 Encounter for general adult medical examination without abnormal findings: Secondary | ICD-10-CM | POA: Insufficient documentation

## 2014-01-26 DIAGNOSIS — Z3009 Encounter for other general counseling and advice on contraception: Secondary | ICD-10-CM

## 2014-01-26 DIAGNOSIS — Z3049 Encounter for surveillance of other contraceptives: Secondary | ICD-10-CM

## 2014-01-26 HISTORY — DX: Encounter for other general counseling and advice on contraception: Z30.09

## 2014-01-26 NOTE — Progress Notes (Signed)
Subjective:     Patient ID: Lindsey ClosGina Jensen, female   DOB: 11-23-1986, 28 y.o.   MRN: [id number]  HPI Lindsey CoasterGina is a 28 year old white female in to discuss birth control options, she stopped OCs due to chest pain and pain in left arm and mood changes.She feels much better since stopping pills and does not want to try different one at present.Was given Junel given after Lindsey Jensen ordered Universal Health[facility]gestin.Pt getting married soon.   Review of Systems See HPI Reviewed past medical,surgical, social and family history. Reviewed medications and allergies.     Objective:   Physical Exam BP 138/80  Ht 5' 8.5" (1.74 m)  Wt 169 lb (76.658 kg)  BMI 25.32 kg/m2  LMP 01/21/2014   talk only, discussed condom use, POPs,pt to try condoms for now.  Assessment:     Contraceptive counselling    Plan:     Try different condoms Think about POPs Follow up prn Review handout on barrier methods Call prn

## 2014-01-26 NOTE — Patient Instructions (Signed)
Contraceptive Barrier Methods A barrier method is a type of birth control (contraception) that is used to prevent pregnancy. These methods include:   Female condom.   Female condom.   Diaphragm.   Cervical cap.   Sponge.   Spermicide.  Your health care provider can help you decide what form of contraception is best for you. Always keep in mind the risks of sexually transmitted infections (STIs).  FEMALE CONDOM A female condom is a thin sheath (latex or rubber) that is worn over the penis during sexual intercourse. The condom prevents pregnancy by catching and stopping the sperm from reaching the uterus. Condoms may come with a spermicide on them, and they can only be worn once. Condoms should not be used with petroleum jelly, lotions, or oils. These things decrease their effectiveness. Condoms can be used with water-based lubricants. Condoms help protect against STIs. Latex and polyurethane condoms provide the best available protection against many STIs, including HIV.  FEMALE CONDOM The female condom is a soft, loose-fitting sheath that is put into the vagina before sexual intercourse. It prevents pregnancy by catching the sperm in the condom and blocking the passage of sperm to the uterus. It is intended for one-time use only. A female partner should not use a condom at the same time. The female and female condoms may stick together and break. A female condom can be inserted as long as 8 hours before intercourse. Condoms help protect against STIs.  DIAPHRAGM A diaphragm is a soft, latex, dome-shaped barrier that is placed in the vagina with spermicidal jelly before sexual intercourse. It covers the cervix, kills sperm, and blocks the passage of semen into the cervix. The diaphragm can be inserted up to 2 hours before sex. If it is inserted more than 2 hours before intercourse, then spermicide must be applied again. The diaphragm should be left in the vagina for 6 8 hours after intercourse. It must  be fitted by a health care provider. This method does not protect against STIs.  CERVICAL CAP A cervical cap is a round, soft, latex or plastic cup that is put in the vagina and fits over the cervix. It may be inserted as long as 6 hours before sexual activity. It must be left in place for at least 6 hours after intercourse and can be left in place for as long as 48 hours. It provides continuous protection as long as it is in place, regardless of the number of intercourse acts. The cervical cap cannot be used during your period. It must be fitted by a health care provider. Cervical caps do not protect against STIs. SPONGE A sponge is a soft, circular piece of polyurethane foam that has spermicide in it. The sponge has a loop for removal. It is inserted into the vagina after wetting it and is placed over the cervix before sexual intercourse. The foam is designed to trap and absorb sperm before it enters the cervix. The spermicide kills or immobilizes sperm. The sponge offers an immediate and continuous presence of spermicide throughout a 24-hour period regardless of the number of intercourse acts. The sponge should be left in place for at least 6 hours after sex. It should not be left in for more than 24 hours, and it cannot be reused. The sponge does not protect against STIs. SPERMICIDES Spermicides are chemicals that kill or block sperm from entering the cervix and uterus. They come in the form of creams, jellies, suppositories, foam, film, or tablets. The film, tablets,  and suppositories should be inserted 10 to 30 minutes before sexual intercourse so they can dissolve. They are inserted into the vagina with an applicator before having sexual intercourse. This must be repeated every time you have sexual intercourse. The use of spermicides does not protect against STIs. Document Released: 10/04/2007 Document Revised: 08/09/2013 Document Reviewed: 05/21/2013 [facility] Patient Information 2014 [place]. Try different condoms Think about progestin pills Call prn

## 2014-03-08 ENCOUNTER — Ambulatory Visit (HOSPITAL_COMMUNITY)
Admission: RE | Admit: 2014-03-08 | Discharge: 2014-03-08 | Disposition: A | Discharge status: Home / Self Care | Payer: BC Managed Care – PPO | Source: Ambulatory Visit | Attending: Family Medicine | Admitting: Family Medicine

## 2014-03-08 ENCOUNTER — Other Ambulatory Visit (HOSPITAL_COMMUNITY): Payer: Self-pay | Admitting: Family Medicine

## 2014-03-08 DIAGNOSIS — R937 Abnormal findings on diagnostic imaging of other parts of musculoskeletal system: Secondary | ICD-10-CM | POA: Insufficient documentation

## 2014-03-08 DIAGNOSIS — M25519 Pain in unspecified shoulder: Secondary | ICD-10-CM

## 2015-07-17 ENCOUNTER — Ambulatory Visit (INDEPENDENT_AMBULATORY_CARE_PROVIDER_SITE_OTHER): Payer: BLUE CROSS/BLUE SHIELD | Admitting: Women's Health

## 2015-07-17 ENCOUNTER — Encounter: Payer: Self-pay | Admitting: Women's Health

## 2015-07-17 VITALS — BP 114/58 | HR 72 | Ht 67.25 in | Wt 171.0 lb

## 2015-07-17 DIAGNOSIS — Z01419 Encounter for gynecological examination (general) (routine) without abnormal findings: Secondary | ICD-10-CM

## 2015-07-17 NOTE — Progress Notes (Signed)
Patient ID: Lindsey Jensen, female   DOB: 11-Jun-1986, 29 y.o.   MRN: [id number] Subjective:   Lindsey Jensen is a 28 y.o. Caucasian female here for a routine well-woman exam.  Patient's last menstrual period was 07/11/2015.    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  Past Medical History Past Medical History  Diagnosis Date  . Contraceptive education 01/26/2014    Past Surgical History Past Surgical History  Procedure Laterality Date  . No past surgeries      Gynecologic History No obstetric history on file.  Patient's last menstrual period was 07/11/2015. Contraception: none, although reports not trying to get pregnant- but also doesn't want to prevent pregnancy Last Pap: 2014. Results were: normal Last mammogram: never. Results were: n/a Last TCS: never  Obstetric History OB History  No data available    Current Medications No current outpatient prescriptions on file prior to visit.   No current facility-administered medications on file prior to visit.    Review of Systems Patient denies any headaches, blurred vision, shortness of breath, chest pain, abdominal pain, problems with bowel movements, urination, or intercourse.  Objective:  BP 114/58 mmHg  Pulse 72  Ht 5' 7.25" (1.708 m)  Wt 171 lb (77.565 kg)  BMI 26.59 kg/m2  LMP 07/11/2015 Physical Exam  General:  Well developed, well nourished, no acute distress. She is alert and oriented  x3. Skin:  Warm and dry Neck:  Midline trachea, no thyromegaly or nodules Cardiovascular: Regular rate and rhythm, no murmur heard Lungs:  Effort normal, all lung fields clear to auscultation bilaterally Breasts:  No dominant palpable mass, retraction, or nipple discharge Abdomen:  Soft, non tender, no hepatosplenomegaly or masses Pelvic:  External genitalia is normal in appearance.  The vagina is normal in appearance. The cervix is bulbous, no CMT.  Thin prep pap is not done  Uterus is felt to be normal size, shape, and contour.  No adnexal masses or tenderness noted. Extremities:  No swelling or varicosities noted Psych:  She has a normal mood and affect  Assessment:   Healthy well-woman exam  Plan:  CBC, CMP, TSH today Begin taking pnv daily in case of pregnancy F/U 88yr for pap & physical, or sooner if needed Mammogram  or sooner if problems Colonoscopy  or sooner if problems  Marge Duncans CNM, [facility] 07/17/2015 4:35 PM

## 2015-07-17 NOTE — Patient Instructions (Signed)
Start prenatal vitamin daily

## 2015-07-18 LAB — COMPREHENSIVE METABOLIC PANEL
ALBUMIN: 4.2 g/dL (ref 3.5–5.5)
ALK PHOS: 59 IU/L (ref 39–117)
ALT: 33 IU/L — AB (ref 0–32)
AST: 18 IU/L (ref 0–40)
Albumin/Globulin Ratio: 1.8 (ref 1.1–2.5)
BUN / CREAT RATIO: 17 (ref 8–20)
BUN: 14 mg/dL (ref 6–20)
Bilirubin Total: 0.3 mg/dL (ref 0.0–1.2)
CO2: 25 mmol/L (ref 18–29)
CREATININE: 0.82 mg/dL (ref 0.57–1.00)
Calcium: 9.3 mg/dL (ref 8.7–10.2)
Chloride: 99 mmol/L (ref 97–108)
GFR, EST AFRICAN AMERICAN: 114 mL/min/{1.73_m2} (ref >59)
GFR, EST NON AFRICAN AMERICAN: 99 mL/min/{1.73_m2} (ref >59)
Globulin, Total: 2.4 g/dL (ref 1.5–4.5)
Glucose: 91 mg/dL (ref 65–99)
Potassium: 4.8 mmol/L (ref 3.5–5.2)
Sodium: 138 mmol/L (ref 134–144)
TOTAL PROTEIN: 6.6 g/dL (ref 6.0–8.5)

## 2015-07-18 LAB — TSH: TSH: 1.48 u[IU]/mL (ref 0.450–4.500)

## 2015-07-18 LAB — CBC
HEMOGLOBIN: 14.2 g/dL (ref 11.1–15.9)
Hematocrit: 41.1 % (ref 34.0–46.6)
MCH: 31.7 pg (ref 26.6–33.0)
MCHC: 34.5 g/dL (ref 31.5–35.7)
MCV: 92 fL (ref 79–97)
PLATELETS: 244 10*3/uL (ref 150–379)
RBC: 4.48 x10E6/uL (ref 3.77–5.28)
RDW: 14.7 % (ref 12.3–15.4)
WBC: 5.5 10*3/uL (ref 3.4–10.8)

## 2015-07-23 ENCOUNTER — Other Ambulatory Visit: Payer: Self-pay | Admitting: Women's Health

## 2015-07-23 DIAGNOSIS — R945 Abnormal results of liver function studies: Principal | ICD-10-CM

## 2015-07-23 DIAGNOSIS — R7989 Other specified abnormal findings of blood chemistry: Secondary | ICD-10-CM

## 2016-07-20 ENCOUNTER — Other Ambulatory Visit: Payer: BLUE CROSS/BLUE SHIELD | Admitting: Women's Health

## 2016-08-31 ENCOUNTER — Encounter: Payer: Self-pay | Admitting: Women's Health

## 2016-08-31 ENCOUNTER — Ambulatory Visit (INDEPENDENT_AMBULATORY_CARE_PROVIDER_SITE_OTHER): Payer: BLUE CROSS/BLUE SHIELD | Admitting: Women's Health

## 2016-08-31 ENCOUNTER — Other Ambulatory Visit (HOSPITAL_COMMUNITY)
Admission: RE | Admit: 2016-08-31 | Discharge: 2016-08-31 | Disposition: A | Discharge status: Home / Self Care | Payer: BLUE CROSS/BLUE SHIELD | Source: Ambulatory Visit | Attending: Obstetrics & Gynecology | Admitting: Obstetrics & Gynecology

## 2016-08-31 VITALS — BP 138/78 | HR 84 | Ht 67.0 in | Wt 168.0 lb

## 2016-08-31 DIAGNOSIS — L0292 Furuncle, unspecified: Secondary | ICD-10-CM

## 2016-08-31 DIAGNOSIS — Z113 Encounter for screening for infections with a predominantly sexual mode of transmission: Secondary | ICD-10-CM | POA: Diagnosis present

## 2016-08-31 DIAGNOSIS — Z01419 Encounter for gynecological examination (general) (routine) without abnormal findings: Secondary | ICD-10-CM | POA: Insufficient documentation

## 2016-08-31 DIAGNOSIS — L0293 Carbuncle, unspecified: Secondary | ICD-10-CM | POA: Insufficient documentation

## 2016-08-31 MED ORDER — SULFAMETHOXAZOLE-TRIMETHOPRIM 800-160 MG PO TABS: 1.0000 | ORAL_TABLET | Freq: Two times a day (BID) | ORAL | 0 refills | Status: AC

## 2016-08-31 NOTE — Patient Instructions (Signed)

## 2016-08-31 NOTE — Progress Notes (Signed)
Subjective:   Lindsey Jensen is a 30 y.o. G0 Caucasian female here for a routine well-woman exam.  Patient's last menstrual period was 08/22/2016.    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  Past Medical History Past Medical History:  Diagnosis Date  . Contraceptive education 01/26/2014    Past Surgical History Past Surgical History:  Procedure Laterality Date  . NO PAST SURGERIES      Gynecologic History No obstetric history on file.  Patient's last menstrual period was 08/22/2016. Contraception: usually uses rhythm method avoiding fertile times Last Pap: 2014. Results were: normal Last mammogram: never. Results were: n/a Last TCS: never  Obstetric History OB History  No data available    Current Medications Current Outpatient Prescriptions on File Prior to Visit  Medication Sig Dispense Refill  . clindamycin (CLEOCIN T) 1 % lotion Apply topically 2 (two) times daily.    . Probiotic Product (PROBIOTIC DAILY PO) Take by mouth.     No current facility-administered medications on file prior to visit.     Review of Systems Patient denies any headaches, blurred vision, shortness of breath, chest pain, abdominal pain, problems with bowel movements, urination, or intercourse.  Objective:  BP 138/78 (BP Location: Right Arm, Patient Position: Sitting, Cuff Size: Normal)   Pulse  84   Ht 5\' 7"  (1.702 m)   Wt 168 lb (76.2 kg)   LMP 08/22/2016   BMI 26.31 kg/m  Physical Exam  General:  Well developed, well nourished, no acute distress. She is alert and oriented x3. Skin:  Warm and dry Neck:  Midline trachea, no thyromegaly or nodules Cardiovascular: Regular rate and rhythm, no murmur heard Lungs:  Effort normal, all lung fields clear to auscultation bilaterally Breasts:  No dominant palpable mass, retraction, or nipple discharge Abdomen:  Soft, non tender, no hepatosplenomegaly or masses Pelvic:  External genitalia is normal in appearance.  The vagina is normal in appearance. The cervix is bulbous, no CMT.  Thin prep pap is done w/ reflex HR HPV cotesting. Uterus is felt to be normal size, shape, and contour.  No adnexal masses or tenderness noted. Lt inner thigh:~2cm erythematous boil w/ white head in center w/ app 4cm of induration surrounding, tender to touch, non-fluctuant- declines I&D attempt Extremities:  No swelling or varicosities noted Psych:  She has a normal mood and affect  Assessment:   Healthy well-woman exam  Lt inner thigh boil Smoker  Plan:  Rx septra ds bid x 10d F/U 2d for f/u on boil, or sooner if needed Warm compresses, call if worsening prior to appt Advised smoking cessation Mammogram @28yo  or sooner if problems Colonoscopy @30yo  or sooner if problems  Marge DuncansBooker, Einer Meals Randall CNM, [facility]NP-BC 08/31/2016 4:44 PM

## 2016-09-02 ENCOUNTER — Ambulatory Visit (INDEPENDENT_AMBULATORY_CARE_PROVIDER_SITE_OTHER): Payer: BLUE CROSS/BLUE SHIELD | Admitting: Women's Health

## 2016-09-02 ENCOUNTER — Encounter: Payer: Self-pay | Admitting: Women's Health

## 2016-09-02 DIAGNOSIS — L0292 Furuncle, unspecified: Secondary | ICD-10-CM | POA: Diagnosis not present

## 2016-09-02 LAB — CYTOLOGY - PAP

## 2016-09-02 NOTE — Progress Notes (Signed)
   [facility] Visit  Patient name: Lindsey Jensen MRN [id number]  Date of birth: 1986/04/29  CC & HPI:  Lindsey Jensen is a 30 y.o. Caucasian female presenting today for f/u on boil on Lt inner thigh. Was rx'd septra ds bid x 10d on 08/31/16. States boil popped on it's own yesterday and is feeling much better.  Patient's last menstrual period was 08/22/2016.  Pertinent History Reviewed:  Medical & Surgical Hx:   Past medical, surgical, family, and social history reviewed in electronic medical record Medications: Reviewed & Updated - see associated section Allergies: Reviewed in electronic medical record  Objective Findings:  Vitals: LMP 08/22/2016  There is no height or weight on file to calculate BMI.  Physical Examination: General appearance - alert, well appearing, and in no distress Skin - boil on Lt inner thigh smaller, not as erythematous, still indurated ~ 4cm Co-exam w/ JVF- center has healed over, opened back up w/ 18g needle to aid in drainage  No results found for this or any previous visit (from the past 24 hour(s)).   Assessment & Plan:  A:   Boil Lt inner thight  P:  Continue septra ds bid x 10d  Warm compresses  Per JVF, massage area to aid in drainage  Return if worsening or not improving  Otherwise f/u 9020yr for physical   Marge DuncansBooker, Chloe Bluett Randall CNM, [facility]NP-BC 09/02/2016 9:43 AM

## 2016-09-07 ENCOUNTER — Telehealth: Payer: Self-pay | Admitting: Women's Health

## 2016-09-07 DIAGNOSIS — Z113 Encounter for screening for infections with a predominantly sexual mode of transmission: Secondary | ICD-10-CM

## 2016-09-07 DIAGNOSIS — A749 Chlamydial infection, unspecified: Secondary | ICD-10-CM | POA: Insufficient documentation

## 2016-09-07 DIAGNOSIS — R748 Abnormal levels of other serum enzymes: Secondary | ICD-10-CM

## 2016-09-07 MED ORDER — AZITHROMYCIN 500 MG PO TABS: 1000.0000 mg | ORAL_TABLET | Freq: Once | ORAL | 0 refills | Status: AC

## 2016-09-07 NOTE — Telephone Encounter (Signed)
Called and notified pt of neg pap w/ +CT, rx azithromycin for pt and husband Lindsey FinlandMichael Jensen DOB [birth year], nkda to pt's pharmacy. No sex x at least 7d from time both completed meds, switched to front to schedule poc in 3-4wks. ALT was also slightly elevated, will recheck when she comes back for poc.  Cheral MarkerKimberly R. Devone Bonilla, CNM, [facility] 09/07/2016 1:56 PM

## 2016-09-07 NOTE — Telephone Encounter (Signed)
Pt called back and has decided she does want all STI screening. Will come in am for labs. HIV, RPR, HepB and will repeat CMP (was actually last year that it was slightly elevated). Discussed limitations/possible false+ of serum HSV, pt does not wish to do HSV. No mention of trich on pap.  Cheral MarkerKimberly R. Lareta Bruneau, CNM, [facility] 09/07/2016 3:46 PM

## 2016-09-09 LAB — HEPATITIS B SURFACE ANTIGEN: Hepatitis B Surface Ag: NEGATIVE

## 2016-09-09 LAB — COMPREHENSIVE METABOLIC PANEL
A/G RATIO: 1.8 (ref 1.2–2.2)
ALT: 39 IU/L — ABNORMAL HIGH (ref 0–32)
AST: 32 IU/L (ref 0–40)
Albumin: 4.6 g/dL (ref 3.5–5.5)
Alkaline Phosphatase: 67 IU/L (ref 39–117)
BUN/Creatinine Ratio: 14 (ref 9–23)
BUN: 11 mg/dL (ref 6–20)
Bilirubin Total: 0.9 mg/dL (ref 0.0–1.2)
CO2: 25 mmol/L (ref 18–29)
Calcium: 9.6 mg/dL (ref 8.7–10.2)
Chloride: 95 mmol/L — ABNORMAL LOW (ref 96–106)
Creatinine, Ser: 0.78 mg/dL (ref 0.57–1.00)
GFR calc Af Amer: 120 mL/min/{1.73_m2} (ref >59)
GFR, EST NON AFRICAN AMERICAN: 105 mL/min/{1.73_m2} (ref >59)
GLOBULIN, TOTAL: 2.6 g/dL (ref 1.5–4.5)
GLUCOSE: 98 mg/dL (ref 65–99)
Potassium: 4.4 mmol/L (ref 3.5–5.2)
SODIUM: 136 mmol/L (ref 134–144)
TOTAL PROTEIN: 7.2 g/dL (ref 6.0–8.5)

## 2016-09-09 LAB — RPR: RPR Ser Ql: NONREACTIVE

## 2016-09-09 LAB — HIV ANTIBODY (ROUTINE TESTING W REFLEX): HIV SCREEN 4TH GENERATION: NONREACTIVE

## 2016-09-10 ENCOUNTER — Telehealth: Payer: Self-pay | Admitting: *Deleted

## 2016-09-10 NOTE — Telephone Encounter (Signed)
Pt informed RPR, HIV, CMP results pending per [facility].

## 2016-09-14 ENCOUNTER — Telehealth: Payer: Self-pay | Admitting: Women's Health

## 2016-09-14 ENCOUNTER — Other Ambulatory Visit: Payer: Self-pay | Admitting: Women's Health

## 2016-09-14 DIAGNOSIS — R7989 Other specified abnormal findings of blood chemistry: Secondary | ICD-10-CM

## 2016-09-14 DIAGNOSIS — R945 Abnormal results of liver function studies: Principal | ICD-10-CM

## 2016-09-14 NOTE — Telephone Encounter (Signed)
Pt informed per Joellyn HaffKim Booker, CNM RPR, HIV, HepB all negative. Pt ALT slightly elevated, decrease Tylenol and Alcohol, recheck in 3 mos. Pt states has a follow up appt in October will make appt for labs at that time.

## 2016-09-14 NOTE — Telephone Encounter (Signed)
Duplicate message. 

## 2016-09-28 ENCOUNTER — Ambulatory Visit (INDEPENDENT_AMBULATORY_CARE_PROVIDER_SITE_OTHER): Payer: BLUE CROSS/BLUE SHIELD | Admitting: Women's Health

## 2016-09-28 DIAGNOSIS — Z113 Encounter for screening for infections with a predominantly sexual mode of transmission: Secondary | ICD-10-CM

## 2016-09-28 NOTE — Progress Notes (Signed)
PT had postive chlamydia culture. Urine collected for proof of culture.Urine sent to lab.P Brandonn Capelli CMA.

## 2016-09-30 LAB — GC/CHLAMYDIA PROBE AMP
Chlamydia trachomatis, NAA: NEGATIVE
NEISSERIA GONORRHOEAE BY PCR: NEGATIVE

## 2017-02-11 ENCOUNTER — Ambulatory Visit (INDEPENDENT_AMBULATORY_CARE_PROVIDER_SITE_OTHER): Payer: BLUE CROSS/BLUE SHIELD | Admitting: Women's Health

## 2017-02-11 ENCOUNTER — Encounter: Payer: Self-pay | Admitting: Women's Health

## 2017-02-11 VITALS — BP 120/72 | HR 84 | Ht 68.0 in | Wt 171.0 lb

## 2017-02-11 DIAGNOSIS — R102 Pelvic and perineal pain: Secondary | ICD-10-CM | POA: Diagnosis not present

## 2017-02-11 DIAGNOSIS — N390 Urinary tract infection, site not specified: Secondary | ICD-10-CM | POA: Diagnosis not present

## 2017-02-11 DIAGNOSIS — R7989 Other specified abnormal findings of blood chemistry: Secondary | ICD-10-CM | POA: Diagnosis not present

## 2017-02-11 DIAGNOSIS — M545 Low back pain, unspecified: Secondary | ICD-10-CM

## 2017-02-11 DIAGNOSIS — N9089 Other specified noninflammatory disorders of vulva and perineum: Secondary | ICD-10-CM

## 2017-02-11 DIAGNOSIS — R945 Abnormal results of liver function studies: Secondary | ICD-10-CM

## 2017-02-11 LAB — POCT WET PREP (WET MOUNT)
CLUE CELLS WET PREP WHIFF POC: NEGATIVE
TRICHOMONAS WET PREP HPF POC: ABSENT

## 2017-02-11 LAB — POCT URINALYSIS DIPSTICK
GLUCOSE UA: NEGATIVE
Ketones, UA: NEGATIVE
Nitrite, UA: NEGATIVE
Protein, UA: NEGATIVE
RBC UA: NEGATIVE

## 2017-02-11 MED ORDER — NITROFURANTOIN MONOHYD MACRO 100 MG PO CAPS: 100.0000 mg | ORAL_CAPSULE | Freq: Two times a day (BID) | ORAL | 0 refills | Status: DC

## 2017-02-11 NOTE — Progress Notes (Signed)
   [facility] Visit  Patient name: Lindsey Jensen MRN [id number]  Date of birth: 1986-10-20  CC & HPI:  Lindsey Jensen is a 31 y.o.  Caucasian female presenting today for report of low abdominal/pelvic and low back pain x 2wks, feels like she was getting a uti like she had years ago, so started drinking cranberry juice and taking azo- went away until she had sex the other day and came back. Some dysuria, doesn't feel she completely empties bladder. Some vulvar irritation, no abnormal d/c, or odor.  Patient's last menstrual period was 01/18/2017. The current method of family planning is rhythm method. Last pap sept 2017, neg  Pertinent History Reviewed:  Medical & Surgical Hx:   Past medical, surgical, family, and social history reviewed in electronic medical record Medications: Reviewed & Updated - see associated section Allergies: Reviewed in electronic medical record  Objective Findings:  Vitals: BP 120/72 (BP Location: Right Arm, Patient Position: Sitting, Cuff Size: Normal)   Pulse 84   Ht 5\' 8"  (1.727 m)   Wt 171 lb (77.6 kg)   LMP 01/18/2017   BMI 26.00 kg/m  Body mass index is 26 kg/m.  Physical Examination: General appearance - alert, well appearing, and in no distress Pelvic - vulva slightly erythematous, mod amt pink nonodorous d/c  Results for orders placed or performed in visit on 02/11/17 (from the past 24 hour(s))  POCT urinalysis dipstick   Collection Time: 02/11/17  3:11 PM  Result Value Ref Range   Color, UA yellow    Clarity, UA cloudy    Glucose, UA neg    Bilirubin, UA     Ketones, UA neg    Spec Grav, UA     Blood, UA neg    pH, UA     Protein, UA neg    Urobilinogen, UA     Nitrite, UA neg    Leukocytes, UA small (1+) (A) Negative     Assessment & Plan:  A:   Presumed UTI based on sx, 1+leuks urine  Vulvar irritation  ALT slightly elevated 08/2016  P:  rx macrobid bid x 7d for uti, send urine cx  Send urine for gc/ct  Offered  mycolog for vulvar irritation, declined  Recheck cmp (ALT slightly elevated 08/2016)  Return for sept in physical.  Marge DuncansBooker, Kimberly Randall CNM, WHNP-BC 02/11/2017 3:24 PM

## 2017-02-13 LAB — URINE CULTURE

## 2017-02-13 LAB — GC/CHLAMYDIA PROBE AMP
CHLAMYDIA, DNA PROBE: NEGATIVE
Neisseria gonorrhoeae by PCR: NEGATIVE

## 2017-05-13 ENCOUNTER — Ambulatory Visit (INDEPENDENT_AMBULATORY_CARE_PROVIDER_SITE_OTHER): Payer: BLUE CROSS/BLUE SHIELD | Admitting: Adult Health

## 2017-05-13 ENCOUNTER — Encounter: Payer: Self-pay | Admitting: Adult Health

## 2017-05-13 VITALS — BP 110/70 | HR 72 | Ht 68.0 in | Wt 172.0 lb

## 2017-05-13 DIAGNOSIS — Z30011 Encounter for initial prescription of contraceptive pills: Secondary | ICD-10-CM

## 2017-05-13 DIAGNOSIS — N390 Urinary tract infection, site not specified: Secondary | ICD-10-CM | POA: Diagnosis not present

## 2017-05-13 DIAGNOSIS — R350 Frequency of micturition: Secondary | ICD-10-CM | POA: Diagnosis not present

## 2017-05-13 DIAGNOSIS — L723 Sebaceous cyst: Secondary | ICD-10-CM | POA: Diagnosis not present

## 2017-05-13 MED ORDER — NORETHIN-ETH ESTRAD-FE BIPHAS 1 MG-10 MCG / 10 MCG PO TABS: 1.0000 | ORAL_TABLET | Freq: Every day | ORAL | 4 refills | Status: DC

## 2017-05-13 MED ORDER — NITROFURANTOIN MONOHYD MACRO 100 MG PO CAPS: 100.0000 mg | ORAL_CAPSULE | Freq: Two times a day (BID) | ORAL | 0 refills | Status: DC

## 2017-05-13 NOTE — Progress Notes (Signed)
Subjective:     Patient ID: Lindsey Jensen, female   DOB: 11/29/1986, 31 y.o.   MRN: [id number]  HPI Lindsey Jensen is a 31 year old white female, separated in for urinary frequency,some low back and stomach pain,  and lump under left arm and wants OCs.Has new partner.She took AZO.   Review of Systems Urinary frequency with some low back and stomach pain Lump under left arm Wants to get on OCs Reviewed past medical,surgical, social and family history. Reviewed medications and allergies.     Objective:   Physical Exam BP 110/70 (BP Location: Left Arm, Patient Position: Sitting, Cuff Size: Small)   Pulse 72   Ht 5\' 8"  (1.727 m)   Wt 172 lb (78 kg)   LMP 04/26/2017   BMI 26.15 kg/m urine orange form AZO.Skin warm and dry. Lungs: clear to ausculation bilaterally. Cardiovascular: regular rate and rhythm.Sebaceous cyst left unde arm when squeezed has white cheesy material expressed, bladder mildly tender, NO CVAT. Discussed trying low dose OC and she wants to.    PHQ 9 score 3.  Assessment:     1. Urinary frequency   2. Urinary tract infection without hematuria, site unspecified   3. Sebaceous cyst of left axilla   4. Encounter for initial prescription of contraceptive pills        Plan:     Meds ordered this encounter  Medications  . Norethindrone-Ethinyl Estradiol-Fe Biphas (LO LOESTRIN FE) 1 MG-10 MCG / 10 MCG tablet    Sig: Take 1 tablet by mouth daily. Take 1 daily by mouth    Dispense:  3 Package    Refill:  4    BIN [id number], PCN [id number], GRP [id number],ID [id number]    Order Specific Question:   Supervising Provider    Answer:   Duane LopeEURE, LUTHER H [[id number]]  . nitrofurantoin, macrocrystal-monohydrate, (MACROBID) 100 MG capsule    Sig: Take 1 capsule (100 mg total) by mouth 2 (two) times daily.    Dispense:  14 capsule    Refill:  0    Order Specific Question:   Supervising Provider    Answer:   Lazaro ArmsEURE, LUTHER H [[id number]]     Start OCs with next period UA C&S sent GC/CHL sent on  urine Follow up prn

## 2017-05-14 LAB — MICROSCOPIC EXAMINATION
Casts: NONE SEEN /lpf
Epithelial Cells (non renal): >10 /hpf — AB (ref 0–10)

## 2017-05-14 LAB — URINALYSIS, ROUTINE W REFLEX MICROSCOPIC
Bilirubin, UA: NEGATIVE
GLUCOSE, UA: NEGATIVE
Ketones, UA: NEGATIVE
NITRITE UA: POSITIVE — AB
Protein, UA: NEGATIVE
RBC, UA: NEGATIVE
SPEC GRAV UA: 1.012 (ref 1.005–1.030)
Urobilinogen, Ur: 1 mg/dL (ref 0.2–1.0)
pH, UA: 6 (ref 5.0–7.5)

## 2017-05-15 LAB — URINE CULTURE

## 2017-05-16 LAB — GC/CHLAMYDIA PROBE AMP
CHLAMYDIA, DNA PROBE: NEGATIVE
Neisseria gonorrhoeae by PCR: NEGATIVE

## 2017-05-28 ENCOUNTER — Ambulatory Visit (INDEPENDENT_AMBULATORY_CARE_PROVIDER_SITE_OTHER): Payer: BLUE CROSS/BLUE SHIELD | Admitting: Obstetrics & Gynecology

## 2017-05-28 ENCOUNTER — Telehealth: Payer: Self-pay | Admitting: Women's Health

## 2017-05-28 DIAGNOSIS — R1031 Right lower quadrant pain: Secondary | ICD-10-CM

## 2017-05-28 DIAGNOSIS — M791 Myalgia: Secondary | ICD-10-CM | POA: Diagnosis not present

## 2017-05-28 DIAGNOSIS — Z87440 Personal history of urinary (tract) infections: Secondary | ICD-10-CM | POA: Diagnosis not present

## 2017-05-28 DIAGNOSIS — M7918 Myalgia, other site: Secondary | ICD-10-CM

## 2017-05-28 LAB — POCT URINALYSIS DIPSTICK
Glucose, UA: NEGATIVE
Ketones, UA: NEGATIVE
NITRITE UA: NEGATIVE
PROTEIN UA: NEGATIVE

## 2017-05-28 MED ORDER — URIBEL 118 MG PO CAPS: 1.0000 | ORAL_CAPSULE | ORAL | 1 refills | Status: DC

## 2017-05-28 NOTE — Telephone Encounter (Signed)
Patient called stating she was treated for a UTI but states the medication provided that day did not do anything and has gotten worse. Please advise.

## 2017-05-28 NOTE — Progress Notes (Signed)
Chief Complaint  Patient presents with  . Urinary Tract Infection    There were no vitals taken for this visit.  31 y.o. G0P0000 No LMP recorded. The current method of family planning is OCP (estrogen/progesterone).  Outpatient Encounter Prescriptions as of 05/28/2017  Medication Sig  . Probiotic Product (PROBIOTIC DAILY PO) Take by mouth.  . clindamycin (CLEOCIN T) 1 % lotion Apply topically 2 (two) times daily.  . Meth-Hyo-M Bl-Na Phos-Ph Sal (URIBEL) 118 MG CAPS Take 1 capsule (118 mg total) by mouth every 4 (four) hours.  . Norethindrone-Ethinyl Estradiol-Fe Biphas (LO LOESTRIN FE) 1 MG-10 MCG / 10 MCG tablet Take 1 tablet by mouth daily. Take 1 daily by mouth (Patient not taking: Reported on 05/28/2017)  . [DISCONTINUED] nitrofurantoin, macrocrystal-monohydrate, (MACROBID) 100 MG capsule Take 1 capsule (100 mg total) by mouth 2 (two) times daily.   No facility-administered encounter medications on file as of 05/28/2017.     Subjective Pt presents reporting concern over a possible UTI She was seen 05/13/2017 and treated for a bladder infection with macrobid However her culture was negative In fact I evaluated several encounters over the last 4 years where she has been treated for a bladder infection and none of her cultures have been positive, including 02/11/2017 Upon further questioning the patient is having lateral pelvic pain radiating to back She is not really having frequency or urgency or dysuria  her pain is greater on the right She just thought it would be a UTI I questioned her regarding a food or menstrual trigger for the discomfort she has and is mistaken for a UTI but nothing seems to be related, she will be more aware of the relation It is certainly concerning for an interstitial cystitis picture  Objective General WDWN female NAD Abdomen soft tender RLQ +Cornet's sign in lower right rectus abdominus muscle, point tender Vulva:  normal appearing vulva with no  masses, tenderness or lesions Vagina:  normal mucosa, no discharge Cervix:  no cervical motion tenderness and no lesions Uterus:  normal size, contour, position, consistency, mobility, non-tender Adnexa: ovaries:present,  normal adnexa in size, nontender and no masses Back no CVAT, some point tenderness in both PSIS, R>L   Pertinent ROS Per HPI No burning with urination, frequency or urgency No nausea, vomiting or diarrhea Nor fever chills or other constitutional symptoms   Labs or studies Urinalysis is negative  Trigger Point Injection   Pre-operative diagnosis: myofascial pain, right rectus abdominus  Post-operative diagnosis: same  After risks and benefits were explained including bleeding, infection, worsening of the pain, damage to the area being injected, weakness, allergic reaction to medications, vascular injection, and nerve damage, signed consent was obtained.  All questions were answered.    The area of the trigger point was identified and the skin prepped three times with alcohol and the alcohol allowed to dry.  Next, a 25 gauge 0.5 inch needle was placed in the area of the trigger point.  Once reproduction of the pain was elicited and negative aspiration confirmed, the trigger point was injected and the needle removed.    The patient did tolerate the procedure well and there were not complications.    Medication used: 10 cc 0.5% marcaine Trigger points injected: 1    Trigger point(s) location(s):  right   Impression Diagnoses this Encounter::   ICD-10-CM   1. Myofacial muscle pain M79.1   2. History of UTI Z87.440 POCT urinalysis dipstick  3. RLQ abdominal  pain R10.31     Established relevant diagnosis(es):   Plan/Recommendations: Meds ordered this encounter  Medications  . Meth-Hyo-M Bl-Na Phos-Ph Sal (URIBEL) 118 MG CAPS    Sig: Take 1 capsule (118 mg total) by mouth every 4 (four) hours.    Dispense:  120 capsule    Refill:  1    Labs or Scans  Ordered: Orders Placed This Encounter  Procedures  . POCT urinalysis dipstick    Management:: The area of point tenderness consistent with neurogenic pain/myofascial pain was isolated and injected it was close to the midline just above the pubis but was not involving insertion of the rectus abdominis on the pubis It is unclear to me if her urinary tract symptoms are related to this although it was clear she's been treated for urinary tract infections bladder infections when her cultures have been negative I was able to find 5 cultures over the past 4 years all of which have been negative but she really doesn't give a history consistent with interstitial cystitis although I'm still suspicious that she may be having some bladder wall inflammation like interstitial cystitis but she just has the symptoms very episodically I have instructed her the next time she gets symptoms to take a bladder number nominal provide her with that by prescription and see if that helps her symptoms She'll give us a call and let us know when that happens and also her response to the uribel or other bladder number whichever she uses  Follow up Return in about 2 weeks (around 06/11/2017) for Follow up, with Dr Despina HiddenEure.          All questions were answered.  Past Medical History:  Diagnosis Date  . Contraceptive education 01/26/2014    Past Surgical History:  Procedure Laterality Date  . NO PAST SURGERIES      OB History    Gravida Para Term Preterm AB Living   0 0 0 0 0 0   SAB TAB Ectopic Multiple Live Births   0 0 0 0 0      Allergies  Allergen Reactions  . Peach Flavor Anaphylaxis    Social History   Social History  . Marital status: Single    Spouse name: N/A  . Number of children: N/A  . Years of education: N/A   Social History Main Topics  . Smoking status: Current Every Day Smoker    Packs/day: 0.75    Types: Cigarettes    Last attempt to quit: 05/06/2015  . Smokeless tobacco: Never  Used  . Alcohol use 8.4 oz/week    14 Glasses of wine per week     Comment: wine  . Drug use: No  . Sexual activity: Yes    Birth control/ protection: None   Other Topics Concern  . Not on file   Social History Narrative  . No narrative on file    Family History  Problem Relation Age of Onset  . Heart disease Father        heart attack  . Diabetes Mother   . Depression Mother   . Anxiety disorder Mother   . Heart disease Mother   . Heart disease Maternal Grandmother        CHF

## 2017-05-28 NOTE — Telephone Encounter (Signed)
Pt called stating that she came into the office this week and was treated by jennifer because [name] was off for a UTI, Pt states that the medication provided that day did not do anything and has gotten worse. I patient know kim is not in the office today pt would like to speak with a nurse

## 2017-06-11 ENCOUNTER — Ambulatory Visit: Payer: BLUE CROSS/BLUE SHIELD | Admitting: Obstetrics & Gynecology

## 2017-09-08 DIAGNOSIS — J209 Acute bronchitis, unspecified: Secondary | ICD-10-CM | POA: Diagnosis not present

## 2017-09-08 DIAGNOSIS — J329 Chronic sinusitis, unspecified: Secondary | ICD-10-CM | POA: Diagnosis not present

## 2017-09-08 DIAGNOSIS — J069 Acute upper respiratory infection, unspecified: Secondary | ICD-10-CM | POA: Diagnosis not present

## 2017-10-07 ENCOUNTER — Ambulatory Visit (INDEPENDENT_AMBULATORY_CARE_PROVIDER_SITE_OTHER): Payer: BLUE CROSS/BLUE SHIELD | Admitting: Women's Health

## 2017-10-07 ENCOUNTER — Encounter: Payer: Self-pay | Admitting: Women's Health

## 2017-10-07 VITALS — BP 110/60 | HR 71 | Ht 68.0 in | Wt 174.0 lb

## 2017-10-07 DIAGNOSIS — L0293 Carbuncle, unspecified: Secondary | ICD-10-CM

## 2017-10-07 DIAGNOSIS — F17200 Nicotine dependence, unspecified, uncomplicated: Secondary | ICD-10-CM

## 2017-10-07 DIAGNOSIS — Z720 Tobacco use: Secondary | ICD-10-CM | POA: Insufficient documentation

## 2017-10-07 DIAGNOSIS — L0233 Carbuncle of buttock: Secondary | ICD-10-CM

## 2017-10-07 DIAGNOSIS — Z3491 Encounter for supervision of normal pregnancy, unspecified, first trimester: Secondary | ICD-10-CM

## 2017-10-07 DIAGNOSIS — L0292 Furuncle, unspecified: Secondary | ICD-10-CM

## 2017-10-07 DIAGNOSIS — Z3A01 Less than 8 weeks gestation of pregnancy: Secondary | ICD-10-CM

## 2017-10-07 DIAGNOSIS — Z3201 Encounter for pregnancy test, result positive: Secondary | ICD-10-CM

## 2017-10-07 DIAGNOSIS — Z01419 Encounter for gynecological examination (general) (routine) without abnormal findings: Secondary | ICD-10-CM

## 2017-10-07 DIAGNOSIS — Z01411 Encounter for gynecological examination (general) (routine) with abnormal findings: Secondary | ICD-10-CM

## 2017-10-07 DIAGNOSIS — Z3490 Encounter for supervision of normal pregnancy, unspecified, unspecified trimester: Secondary | ICD-10-CM | POA: Insufficient documentation

## 2017-10-07 LAB — POCT URINE PREGNANCY: Preg Test, Ur: POSITIVE — AB

## 2017-10-07 MED ORDER — PRENATE PIXIE 10-0.6-0.4-200 MG PO CAPS: 1.0000 | ORAL_CAPSULE | Freq: Every day | ORAL | 11 refills | Status: DC

## 2017-10-07 MED ORDER — CEPHALEXIN 500 MG PO CAPS: 500.0000 mg | ORAL_CAPSULE | Freq: Four times a day (QID) | ORAL | 0 refills | Status: DC

## 2017-10-07 NOTE — Patient Instructions (Signed)

## 2017-10-07 NOTE — Progress Notes (Signed)
[facility]  Patient name: Lindsey Jensen MRN [id number]  Date of birth: 1986/01/31 CC & HPI:  Lindsey Jensen is a 31 y.o. G35P0000 Caucasian female being seen today for a routine well-woman exam.  Current complaints: +HPT on Monday! Is taking prenatal gummies, doesn't like them, needs small pnv. Some n/v last week,none now. Some breast tenderness. Stopped smoking/drinking wine. Was smoking 1ppd, but for past month prior to +PT had cut down to 1/2ppd. Asking about patches if she needs them to stay quit.  Boil on Rt buttocks x few days, has been applying heating pad, neosporin. Gets them frequently and they usually go away with warm compresses/neosporin.   PCP: Robbie Lis      does not desire labs Patient's last menstrual period was 09/02/2017 (exact date). The current method of family planning is none Last pap 08/31/16. Results were: normal Last mammogram: never. Results were: n/a Last colonoscopy: never. Results were: n/a  Review of Systems:   Denies any headaches, blurred vision, fatigue, shortness of breath, chest pain, abdominal pain, abnormal vaginal discharge/itching/odor/irritation, problems with periods, bowel movements, urination, or intercourse unless otherwise stated above.  Pertinent History Reviewed:  Reviewed past medical,surgical, social and family history.  Reviewed problem list, medications and allergies. Objective Findings:   Vitals:   10/07/17 1053  BP: 110/60  Pulse: 71  Weight: 174 lb (78.9 kg)  Height: 5\' 8"  (1.727 m)  Body mass index is 26.46 kg/m.  Physical Examination: General appearance - well appearing, and in no distress Mental status - alert, oriented to person, place, and time Psych:  She has a normal mood and affect Skin - warm and dry, normal color, no suspicious lesions noted Chest - effort normal, all lung fields clear to auscultation bilaterally Heart - normal rate and regular rhythm Neck:  midline trachea, no thyromegaly or  nodules Breasts - breasts appear normal, no suspicious masses, no skin or nipple changes or  axillary nodes Abdomen - soft, nontender, nondistended, no masses or organomegaly Pelvic - VULVA: normal appearing vulva with no masses, tenderness or lesions  VAGINA: normal appearing vagina with normal color and discharge, no lesions  CERVIX: normal appearing cervix without discharge or lesions, no CMT  Thin prep pap is not done  UTERUS: uterus is felt to be normal size, shape, consistency and nontender   ADNEXA: No adnexal masses or tenderness noted. Buttocks: large erythematous/indurated area Rt lower buttocks, no obvious boil, but does feel slightly firmer in center. Extremities:  No swelling or varicosities noted  Results for orders placed or performed in visit on 10/07/17 (from the past 24 hour(s))  POCT urine pregnancy   Collection Time: 10/07/17 10:57 AM  Result Value Ref Range   Preg Test, Ur Positive (A) Negative    Assessment & Plan:  1) Healthy Well-Woman Exam 2) Pregnant> ~5wks by certain LMP, rx prenate pixie w/ 11RF, will get in for dating u/s and new ob, gave printed info  3) Boil Rt buttocks> rx keflex QID x 7d, continue warm compresses. Pt has hard time getting off work so can not make f/u appt to recheck. She is to let us know if not improving or worsening 4) Previous smoker> just quit w/ +PT, inquiring about patch in case she needs it, would do 7mg  patch if needed  Mammogram @31yo  or sooner if problems Colonoscopy @31yo  or sooner if problems  Orders Placed This Encounter  Procedures  . US OB Comp Less 14 Wks  . POCT urine  pregnancy    Return in about 2 weeks (around 10/21/2017) for dating u/s, intake w/ tish & new ob on same day if possible.  Marge DuncansBooker, Catheleen Langhorne Randall CNM, [facility]NP[facility]BC 10/07/2017 11:36 AM

## 2017-10-13 ENCOUNTER — Telehealth: Payer: Self-pay | Admitting: Women's Health

## 2017-10-13 MED ORDER — FLUCONAZOLE 150 MG PO TABS: ORAL_TABLET | ORAL | 0 refills | Status: DC

## 2017-10-13 NOTE — Telephone Encounter (Signed)
Will rx diflucan  

## 2017-10-13 NOTE — Telephone Encounter (Signed)
Pt called stating that she was recently prescribed an antibiotic for an abscess. She states that now, after taking the antibiotic, she has s/s of a yeast infection. She states that she is having some vaginal irritation and white d/c. She requests that something be prescribed for this. Advised pt that I would send her request to a provider and she should check with her pharmacy in the next 24 hours. Pt verbalized understanding.

## 2017-10-21 ENCOUNTER — Encounter: Payer: Self-pay | Admitting: Advanced Practice Midwife

## 2017-10-21 ENCOUNTER — Ambulatory Visit (INDEPENDENT_AMBULATORY_CARE_PROVIDER_SITE_OTHER): Payer: BLUE CROSS/BLUE SHIELD

## 2017-10-21 ENCOUNTER — Ambulatory Visit (INDEPENDENT_AMBULATORY_CARE_PROVIDER_SITE_OTHER): Payer: BLUE CROSS/BLUE SHIELD | Admitting: Advanced Practice Midwife

## 2017-10-21 ENCOUNTER — Ambulatory Visit: Payer: BLUE CROSS/BLUE SHIELD | Admitting: *Deleted

## 2017-10-21 VITALS — BP 120/70 | HR 78 | Wt 176.0 lb

## 2017-10-21 DIAGNOSIS — Z3A01 Less than 8 weeks gestation of pregnancy: Secondary | ICD-10-CM

## 2017-10-21 DIAGNOSIS — Z3401 Encounter for supervision of normal first pregnancy, first trimester: Secondary | ICD-10-CM | POA: Diagnosis not present

## 2017-10-21 DIAGNOSIS — Z331 Pregnant state, incidental: Secondary | ICD-10-CM

## 2017-10-21 DIAGNOSIS — Z1389 Encounter for screening for other disorder: Secondary | ICD-10-CM

## 2017-10-21 DIAGNOSIS — Z23 Encounter for immunization: Secondary | ICD-10-CM

## 2017-10-21 DIAGNOSIS — Z3400 Encounter for supervision of normal first pregnancy, unspecified trimester: Secondary | ICD-10-CM | POA: Insufficient documentation

## 2017-10-21 DIAGNOSIS — Z3491 Encounter for supervision of normal pregnancy, unspecified, first trimester: Secondary | ICD-10-CM | POA: Diagnosis not present

## 2017-10-21 DIAGNOSIS — Z3682 Encounter for antenatal screening for nuchal translucency: Secondary | ICD-10-CM

## 2017-10-21 LAB — POCT URINALYSIS DIPSTICK
Blood, UA: NEGATIVE
GLUCOSE UA: NEGATIVE
Ketones, UA: NEGATIVE
Nitrite, UA: NEGATIVE
Protein, UA: NEGATIVE

## 2017-10-21 MED ORDER — CLINDAMYCIN PHOSPHATE 1 % EX GEL: Freq: Two times a day (BID) | CUTANEOUS | 11 refills | Status: DC

## 2017-10-21 NOTE — Progress Notes (Signed)
US 7 wks,single IUP w/ys,positive fht 133 bpm,normal ovaries bilat,crl 9.9 mm,EDD 06/09/2018 BY LMP

## 2017-10-21 NOTE — Progress Notes (Signed)
  Subjective:    Lindsey Jensen is a [name] 7948w0d being seen today for her first obstetrical visit.  Her obstetrical history is significant for first pregnancy.  Pregnancy history fully reviewed.  Patient reports gluteal boil is better. Gets boils all the time, clindamycin topical worked some. Has one behind ear, not ready to drain.   Vitals:   10/21/17 1509  BP: 120/70  Pulse: 78  Weight: 79.8 kg (176 lb)    HISTORY: OB History  Gravida Para Term Preterm AB Living  1 0 0 0 0 0  SAB TAB Ectopic Multiple Live Births  0 0 0 0 0    # Outcome Date GA Lbr Len/2nd Weight Sex Delivery Anes PTL Lv  1 Current              Past Medical History:  Diagnosis Date  . Contraceptive education 01/26/2014   Past Surgical History:  Procedure Laterality Date  . NO PAST SURGERIES     Family History  Problem Relation Age of Onset  . Heart disease Father        heart attack  . Diabetes Mother   . Depression Mother   . Anxiety disorder Mother   . Heart disease Mother   . Heart disease Maternal Grandmother        CHF  . Alzheimer's disease Maternal Grandfather      Exam       Pelvic Exam:    Perineum: Normal Perineum   Vulva: normal   Vagina:  normal mucosa, normal discharge, (no evidence of yeast) no palpable nodules  Boil h ealed   Uterus Normal, Gravid, FH: 7     Cervix: normal   Adnexa: Not palpable   Urinary:  urethral meatus normal    System:     Skin: normal coloration and turgor, no rashes    Neurologic: oriented, normal, normal mood   Extremities: normal strength, tone, and muscle mass   HEENT PERRLA   Mouth/Teeth mucous membranes moist, normal dentition   Neck supple and no masses   Cardiovascular: regular rate and rhythm   Respiratory:  appears well, vitals normal, no respiratory distress, acyanotic   Abdomen: soft, non-tender;         The [facility] with multiple MDs and other Advanced Practice Providers was  explained to patient; also emphasized that residents, students are part of our team.  Assessment:    Pregnancy: [name] Patient Active Problem List   Diagnosis Date Noted  . Supervision of normal first pregnancy 10/21/2017  . Pregnant 10/07/2017  . Smoker 10/07/2017  . Chlamydia 09/07/2016  . Recurrent boils 08/31/2016  . Generalized anxiety disorder 08/23/2013     US 7 wks,single IUP w/ys,positive fht 133 bpm,normal ovaries bilat,crl 9.9 mm,EDD 06/09/2018 BY LMP   Plan:     Initial labs drawn. Continue prenatal vitamins rx clindamycin topical  Problem list reviewed and updated  Reviewed n/v relief measures and warning s/s to report  Reviewed recommended weight gain based on pre-gravid BMI  Encouraged well-balanced diet Genetic Screening discussed Integrated Screen: requested.  Ultrasound discussed; fetal survey: requested.  Return in about 5 weeks (around 11/22/2017) for LROB, US:NT+1st IT.  CRESENZO-DISHMAN,Audrionna Lampton 10/21/2017

## 2017-10-21 NOTE — Patient Instructions (Signed)
 First Trimester of Pregnancy The first trimester of pregnancy is from week 1 until the end of week 12 (months 1 through 3). A week after a sperm fertilizes an egg, the egg will implant on the wall of the uterus. This embryo will begin to develop into a baby. Genes from you and your partner are forming the baby. The female genes determine whether the baby is a boy or a girl. At 6-8 weeks, the eyes and face are formed, and the heartbeat can be seen on ultrasound. At the end of 12 weeks, all the baby's organs are formed.  Now that you are pregnant, you will want to do everything you can to have a healthy baby. Two of the most important things are to get good prenatal care and to follow your health care provider's instructions. Prenatal care is all the medical care you receive before the baby's birth. This care will help prevent, find, and treat any problems during the pregnancy and childbirth. BODY CHANGES Your body goes through many changes during pregnancy. The changes vary from woman to woman.   You may gain or lose a couple of pounds at first.  You may feel sick to your stomach (nauseous) and throw up (vomit). If the vomiting is uncontrollable, call your health care provider.  You may tire easily.  You may develop headaches that can be relieved by medicines approved by your health care provider.  You may urinate more often. Painful urination may mean you have a bladder infection.  You may develop heartburn as a result of your pregnancy.  You may develop constipation because certain hormones are causing the muscles that push waste through your intestines to slow down.  You may develop hemorrhoids or swollen, bulging veins (varicose veins).  Your breasts may begin to grow larger and become tender. Your nipples may stick out more, and the tissue that surrounds them (areola) may become darker.  Your gums may bleed and may be sensitive to brushing and flossing.  Dark spots or blotches  (chloasma, mask of pregnancy) may develop on your face. This will likely fade after the baby is born.  Your menstrual periods will stop.  You may have a loss of appetite.  You may develop cravings for certain kinds of food.  You may have changes in your emotions from day to day, such as being excited to be pregnant or being concerned that something may go wrong with the pregnancy and baby.  You may have more vivid and strange dreams.  You may have changes in your hair. These can include thickening of your hair, rapid growth, and changes in texture. Some women also have hair loss during or after pregnancy, or hair that feels dry or thin. Your hair will most likely return to normal after your baby is born. WHAT TO EXPECT AT YOUR PRENATAL VISITS During a routine prenatal visit:  You will be weighed to make sure you and the baby are growing normally.  Your blood pressure will be taken.  Your abdomen will be measured to track your baby's growth.  The fetal heartbeat will be listened to starting around week 10 or 12 of your pregnancy.  Test results from any previous visits will be discussed. Your health care provider may ask you:  How you are feeling.  If you are feeling the baby move.  If you have had any abnormal symptoms, such as leaking fluid, bleeding, severe headaches, or abdominal cramping.  If you have any questions. Other   tests that may be performed during your first trimester include:  Blood tests to find your blood type and to check for the presence of any previous infections. They will also be used to check for low iron levels (anemia) and Rh antibodies. Later in the pregnancy, blood tests for diabetes will be done along with other tests if problems develop.  Urine tests to check for infections, diabetes, or protein in the urine.  An ultrasound to confirm the proper growth and development of the baby.  An amniocentesis to check for possible genetic problems.  Fetal  screens for spina bifida and Down syndrome.  You may need other tests to make sure you and the baby are doing well. HOME CARE INSTRUCTIONS  Medicines  Follow your health care provider's instructions regarding medicine use. Specific medicines may be either safe or unsafe to take during pregnancy.  Take your prenatal vitamins as directed.  If you develop constipation, try taking a stool softener if your health care provider approves. Diet  Eat regular, well-balanced meals. Choose a variety of foods, such as meat or vegetable-based protein, fish, milk and low-fat dairy products, vegetables, fruits, and whole grain breads and cereals. Your health care provider will help you determine the amount of weight gain that is right for you.  Avoid raw meat and uncooked cheese. These carry germs that can cause birth defects in the baby.  Eating four or five small meals rather than three large meals a day may help relieve nausea and vomiting. If you start to feel nauseous, eating a few soda crackers can be helpful. Drinking liquids between meals instead of during meals also seems to help nausea and vomiting.  If you develop constipation, eat more high-fiber foods, such as fresh vegetables or fruit and whole grains. Drink enough fluids to keep your urine clear or pale yellow. Activity and Exercise  Exercise only as directed by your health care provider. Exercising will help you:  Control your weight.  Stay in shape.  Be prepared for labor and delivery.  Experiencing pain or cramping in the lower abdomen or low back is a good sign that you should stop exercising. Check with your health care provider before continuing normal exercises.  Try to avoid standing for long periods of time. Move your legs often if you must stand in one place for a long time.  Avoid heavy lifting.  Wear low-heeled shoes, and practice good posture.  You may continue to have sex unless your health care provider directs you  otherwise. Relief of Pain or Discomfort  Wear a good support bra for breast tenderness.   Take warm sitz baths to soothe any pain or discomfort caused by hemorrhoids. Use hemorrhoid cream if your health care provider approves.   Rest with your legs elevated if you have leg cramps or low back pain.  If you develop varicose veins in your legs, wear support hose. Elevate your feet for 15 minutes, 3-4 times a day. Limit salt in your diet. Prenatal Care  Schedule your prenatal visits by the twelfth week of pregnancy. They are usually scheduled monthly at first, then more often in the last 2 months before delivery.  Write down your questions. Take them to your prenatal visits.  Keep all your prenatal visits as directed by your health care provider. Safety  Wear your seat belt at all times when driving.  Make a list of emergency phone numbers, including numbers for family, friends, the hospital, and police and fire departments. General   Tips  Ask your health care provider for a referral to a local prenatal education class. Begin classes no later than at the beginning of month 6 of your pregnancy.  Ask for help if you have counseling or nutritional needs during pregnancy. Your health care provider can offer advice or refer you to specialists for help with various needs.  Do not use hot tubs, steam rooms, or saunas.  Do not douche or use tampons or scented sanitary pads.  Do not cross your legs for long periods of time.  Avoid cat litter boxes and soil used by cats. These carry germs that can cause birth defects in the baby and possibly loss of the fetus by miscarriage or stillbirth.  Avoid all smoking, herbs, alcohol, and medicines not prescribed by your health care provider. Chemicals in these affect the formation and growth of the baby.  Schedule a dentist appointment. At home, brush your teeth with a soft toothbrush and be gentle when you floss. SEEK MEDICAL CARE IF:   You have  dizziness.  You have mild pelvic cramps, pelvic pressure, or nagging pain in the abdominal area.  You have persistent nausea, vomiting, or diarrhea.  You have a bad smelling vaginal discharge.  You have pain with urination.  You notice increased swelling in your face, hands, legs, or ankles. SEEK IMMEDIATE MEDICAL CARE IF:   You have a fever.  You are leaking fluid from your vagina.  You have spotting or bleeding from your vagina.  You have severe abdominal cramping or pain.  You have rapid weight gain or loss.  You vomit blood or material that looks like coffee grounds.  You are exposed to German measles and have never had them.  You are exposed to fifth disease or chickenpox.  You develop a severe headache.  You have shortness of breath.  You have any kind of trauma, such as from a fall or a car accident. Document Released: 12/01/2001 Document Revised: 04/23/2014 Document Reviewed: 10/17/2013 [employer] Patient Information 2015 [employer]. This information is not intended to replace advice given to you by your health care provider. Make sure you discuss any questions you have with your health care provider.   Nausea & Vomiting  Have saltine crackers or pretzels by your bed and eat a few bites before you raise your head out of bed in the morning  Eat small frequent meals throughout the day instead of large meals  Drink plenty of fluids throughout the day to stay hydrated, just don't drink a lot of fluids with your meals.  This can make your stomach fill up faster making you feel sick  Do not brush your teeth right after you eat  Products with real ginger are good for nausea, like ginger ale and ginger hard candy Make sure it says made with real ginger!  Sucking on sour candy like lemon heads is also good for nausea  If your prenatal vitamins make you nauseated, take them at night so you will sleep through the nausea  Sea Bands  If you feel like you need  medicine for the nausea & vomiting please let us know  If you are unable to keep any fluids or food down please let us know   Constipation  Drink plenty of fluid, preferably water, throughout the day  Eat foods high in fiber such as fruits, vegetables, and grains  Exercise, such as walking, is a good way to keep your bowels regular  Drink warm fluids, especially warm   prune juice, or decaf coffee  Eat a 1/2 cup of real oatmeal (not instant), 1/2 cup applesauce, and 1/2-1 cup warm prune juice every day  If needed, you may take Colace (docusate sodium) stool softener once or twice a day to help keep the stool soft. If you are pregnant, wait until you are out of your first trimester (12-14 weeks of pregnancy)  If you still are having problems with constipation, you may take Miralax once daily as needed to help keep your bowels regular.  If you are pregnant, wait until you are out of your first trimester (12-14 weeks of pregnancy)  Safe Medications in Pregnancy   Acne: Benzoyl Peroxide Salicylic Acid  Backache/Headache: Tylenol: 2 regular strength every 4 hours OR              2 Extra strength every 6 hours  Colds/Coughs/Allergies: Benadryl (alcohol free) 25 mg every 6 hours as needed Breath right strips Claritin Cepacol throat lozenges Chloraseptic throat spray Cold-Eeze- up to three times per day Cough drops, alcohol free Flonase (by prescription only) Guaifenesin Mucinex Robitussin DM (plain only, alcohol free) Saline nasal spray/drops Sudafed (pseudoephedrine) & Actifed ** use only after [redacted] weeks gestation and if you do not have high blood pressure Tylenol Vicks Vaporub Zinc lozenges Zyrtec   Constipation: Colace Ducolax suppositories Fleet enema Glycerin suppositories Metamucil Milk of magnesia Miralax Senokot Smooth move tea  Diarrhea: Kaopectate Imodium A-D  *NO pepto Bismol  Hemorrhoids: Anusol Anusol HC Preparation  H Tucks  Indigestion: Tums Maalox Mylanta Zantac  Pepcid  Insomnia: Benadryl (alcohol free) 25mg every 6 hours as needed Tylenol PM Unisom, no Gelcaps  Leg Cramps: Tums MagGel  Nausea/Vomiting:  Bonine Dramamine Emetrol Ginger extract Sea bands Meclizine  Nausea medication to take during pregnancy:  Unisom (doxylamine succinate 25 mg tablets) Take one tablet daily at bedtime. If symptoms are not adequately controlled, the dose can be increased to a maximum recommended dose of two tablets daily (1/2 tablet in the morning, 1/2 tablet mid-afternoon and one at bedtime). Vitamin B6 100mg tablets. Take one tablet twice a day (up to 200 mg per day).  Skin Rashes: Aveeno products Benadryl cream or 25mg every 6 hours as needed Calamine Lotion 1% cortisone cream  Yeast infection: Gyne-lotrimin 7 Monistat 7   **If taking multiple medications, please check labels to avoid duplicating the same active ingredients **take medication as directed on the label ** Do not exceed 4000 mg of tylenol in 24 hours **Do not take medications that contain aspirin or ibuprofen      

## 2017-10-22 LAB — URINALYSIS, ROUTINE W REFLEX MICROSCOPIC
Bilirubin, UA: NEGATIVE
GLUCOSE, UA: NEGATIVE
Ketones, UA: NEGATIVE
NITRITE UA: NEGATIVE
Protein, UA: NEGATIVE
RBC, UA: NEGATIVE
SPEC GRAV UA: 1.011 (ref 1.005–1.030)
Urobilinogen, Ur: 0.2 mg/dL (ref 0.2–1.0)
pH, UA: 7 (ref 5.0–7.5)

## 2017-10-22 LAB — RUBELLA SCREEN: Rubella Antibodies, IGG: 3.24 index (ref >0.99)

## 2017-10-22 LAB — MICROSCOPIC EXAMINATION: Casts: NONE SEEN /lpf

## 2017-10-22 LAB — PMP SCREEN PROFILE (10S), URINE
AMPHETAMINE SCREEN URINE: NEGATIVE ng/mL
BARBITURATE SCREEN URINE: NEGATIVE ng/mL
BENZODIAZEPINE SCREEN, URINE: NEGATIVE ng/mL
CANNABINOIDS UR QL SCN: NEGATIVE ng/mL
CREATININE(CRT), U: 34.6 mg/dL (ref 20.0–300.0)
Cocaine (Metab) Scrn, Ur: NEGATIVE ng/mL
Methadone Screen, Urine: NEGATIVE ng/mL
OXYCODONE+OXYMORPHONE UR QL SCN: NEGATIVE ng/mL
Opiate Scrn, Ur: NEGATIVE ng/mL
PH UR, DRUG SCRN: 6.7 (ref 4.5–8.9)
PHENCYCLIDINE QUANTITATIVE URINE: NEGATIVE ng/mL
Propoxyphene Scrn, Ur: NEGATIVE ng/mL

## 2017-10-22 LAB — CBC
HEMATOCRIT: 48.6 % — AB (ref 34.0–46.6)
HEMOGLOBIN: 17 g/dL — AB (ref 11.1–15.9)
MCH: 32.5 pg (ref 26.6–33.0)
MCHC: 35 g/dL (ref 31.5–35.7)
MCV: 93 fL (ref 79–97)
Platelets: 231 10*3/uL (ref 150–379)
RBC: 5.23 x10E6/uL (ref 3.77–5.28)
RDW: 14.2 % (ref 12.3–15.4)
WBC: 11.2 10*3/uL — ABNORMAL HIGH (ref 3.4–10.8)

## 2017-10-22 LAB — ANTIBODY SCREEN: ANTIBODY SCREEN: NEGATIVE

## 2017-10-22 LAB — RPR: RPR: NONREACTIVE

## 2017-10-22 LAB — ABO/RH: RH TYPE: POSITIVE

## 2017-10-22 LAB — HIV ANTIBODY (ROUTINE TESTING W REFLEX): HIV SCREEN 4TH GENERATION: NONREACTIVE

## 2017-10-22 LAB — GC/CHLAMYDIA PROBE AMP
CHLAMYDIA, DNA PROBE: NEGATIVE
NEISSERIA GONORRHOEAE BY PCR: NEGATIVE

## 2017-10-22 LAB — VARICELLA ZOSTER ANTIBODY, IGG: VARICELLA: 1022 {index} (ref >165)

## 2017-10-22 LAB — HEPATITIS B SURFACE ANTIGEN: HEP B S AG: NEGATIVE

## 2017-10-23 LAB — URINE CULTURE

## 2017-11-25 ENCOUNTER — Ambulatory Visit (INDEPENDENT_AMBULATORY_CARE_PROVIDER_SITE_OTHER): Payer: BLUE CROSS/BLUE SHIELD | Admitting: Women's Health

## 2017-11-25 ENCOUNTER — Encounter: Payer: Self-pay | Admitting: Women's Health

## 2017-11-25 ENCOUNTER — Ambulatory Visit (INDEPENDENT_AMBULATORY_CARE_PROVIDER_SITE_OTHER): Payer: BLUE CROSS/BLUE SHIELD

## 2017-11-25 ENCOUNTER — Other Ambulatory Visit: Payer: Self-pay

## 2017-11-25 VITALS — BP 118/72 | HR 75 | Wt 176.0 lb

## 2017-11-25 DIAGNOSIS — Z3682 Encounter for antenatal screening for nuchal translucency: Secondary | ICD-10-CM

## 2017-11-25 DIAGNOSIS — Z3401 Encounter for supervision of normal first pregnancy, first trimester: Secondary | ICD-10-CM

## 2017-11-25 DIAGNOSIS — L0293 Carbuncle, unspecified: Secondary | ICD-10-CM

## 2017-11-25 DIAGNOSIS — N898 Other specified noninflammatory disorders of vagina: Secondary | ICD-10-CM

## 2017-11-25 DIAGNOSIS — O26891 Other specified pregnancy related conditions, first trimester: Secondary | ICD-10-CM | POA: Diagnosis not present

## 2017-11-25 DIAGNOSIS — Z331 Pregnant state, incidental: Secondary | ICD-10-CM

## 2017-11-25 DIAGNOSIS — Z1389 Encounter for screening for other disorder: Secondary | ICD-10-CM

## 2017-11-25 LAB — POCT WET PREP (WET MOUNT)
Clue Cells Wet Prep Whiff POC: NEGATIVE
Trichomonas Wet Prep HPF POC: ABSENT

## 2017-11-25 LAB — POCT URINALYSIS DIPSTICK
Blood, UA: NEGATIVE
Glucose, UA: NEGATIVE
KETONES UA: NEGATIVE
Leukocytes, UA: NEGATIVE
Nitrite, UA: NEGATIVE
PROTEIN UA: NEGATIVE

## 2017-11-25 NOTE — Progress Notes (Signed)
LOW-RISK PREGNANCY VISIT Patient name: Lindsey Jensen MRN [id number]  Date of birth: May 20, 1986 Chief Complaint:   Routine Prenatal Visit (NT today; c/o brown d/c)  History of Present Illness:   Lindsey Jensen is a 31 y.o. [name] female at 6962w0d with an Estimated Date of Delivery: 06/09/18 being seen today for ongoing management of a low-risk pregnancy.  Today she reports brown discharge for few weeks, started off darker now lighter almost yellow- wears pantyliner daily, has never been red.  . Vag. Bleeding: None.   . denies leaking of fluid. Review of Systems:   Pertinent items are noted in HPI Denies abnormal vaginal discharge w/ itching/odor/irritation, headaches, visual changes, shortness of breath, chest pain, abdominal pain, severe nausea/vomiting, or problems with urination or bowel movements unless otherwise stated above. Pertinent History Reviewed:  Reviewed past medical,surgical, social, obstetrical and family history.  Reviewed problem list, medications and allergies. Physical Assessment:   Vitals:   11/25/17 1623  BP: 118/72  Pulse: 75  Weight: 176 lb (79.8 kg)  Body mass index is 26.76 kg/m.        Physical Examination:   General appearance: Well appearing, and in no distress  Mental status: Alert, oriented to person, place, and time  Skin: Warm & dry  Cardiovascular: Normal heart rate noted  Respiratory: Normal respiratory effort, no distress  Abdomen: Soft, gravid, nontender  Pelvic: cx visually closed, small amt white/light yellow nonodorous d/c         Extremities:    Fetal Status: Fetal Heart Rate (bpm): 164 u/s         NT u/s: US 12 wks,measurements c/w dates,NB present,NT 1.5 mm,crl 60.61 mm,normal ovaries bilat,fhr 164 pm,anterior pl gr 0   Results for orders placed or performed in visit on 11/25/17 (from the past 24 hour(s))  POCT urinalysis dipstick   Collection Time: 11/25/17  4:25 PM  Result Value Ref Range   Color, UA     Clarity, UA     Glucose, UA neg    Bilirubin, UA     Ketones, UA neg    Spec Grav, UA  1.010 - 1.025   Blood, UA neg    pH, UA  5.0 - 8.0   Protein, UA neg    Urobilinogen, UA  0.2 or 1.0 E.U./dL   Nitrite, UA neg    Leukocytes, UA Negative Negative  POCT Wet Prep [facility] [place])   Collection Time: 11/25/17  4:44 PM  Result Value Ref Range   Source Wet Prep POC vaginal    WBC, Wet Prep HPF POC few    Bacteria Wet Prep HPF POC None (A) Few   BACTERIA WET PREP MORPHOLOGY POC     Clue Cells Wet Prep HPF POC None None   Clue Cells Wet Prep Whiff POC Negative Whiff    Yeast Wet Prep HPF POC None    KOH Wet Prep POC     Trichomonas Wet Prep HPF POC Absent Absent    Assessment & Plan:  1) Low-risk pregnancy [name] at 9662w0d with an Estimated Date of Delivery: 06/09/18   2) Discharge, wet prep neg, no evidence of blood   Labs/procedures today: 1st IT/NT  Plan:  Continue routine obstetrical care   Reviewed: Preterm labor symptoms and general obstetric precautions including but not limited to vaginal bleeding, contractions, leaking of fluid and fetal movement were reviewed in detail with the patient.  All questions were answered  Follow-up: Return in about 4 weeks (around 12/23/2017) for LROB,  2nd IT.  Orders Placed This Encounter  Procedures  . Integrated 1  . POCT urinalysis dipstick  . POCT [facility] [place]   Marge DuncansBooker, Merrick Maggio Randall CNM, [facility]NP-BC 11/25/2017 4:44 PM

## 2017-11-25 NOTE — Progress Notes (Signed)
US 12 wks,measurements c/w dates,NB present,NT 1.5 mm,crl 60.61 mm,normal ovaries bilat,fhr 164 pm,anterior pl gr 0

## 2017-11-25 NOTE — Patient Instructions (Signed)
Ardyth GalGina Chambers, I greatly value your feedback.  If you receive a survey following your visit with us today, we appreciate you taking the time to fill it out.  Thanks, Joellyn HaffKim Booker, CNM, WHNP-BC   Second Trimester of Pregnancy The second trimester is from week 14 through week 27 (months 4 through 6). The second trimester is often a time when you feel your best. Your body has adjusted to being pregnant, and you begin to feel better physically. Usually, morning sickness has lessened or quit completely, you may have more energy, and you may have an increase in appetite. The second trimester is also a time when the fetus is growing rapidly. At the end of the sixth month, the fetus is about 9 inches long and weighs about 1 pounds. You will likely begin to feel the baby move (quickening) between 16 and 20 weeks of pregnancy. Body changes during your second trimester Your body continues to go through many changes during your second trimester. The changes vary from woman to woman.  Your weight will continue to increase. You will notice your lower abdomen bulging out.  You may begin to get stretch marks on your hips, abdomen, and breasts.  You may develop headaches that can be relieved by medicines. The medicines should be approved by your health care provider.  You may urinate more often because the fetus is pressing on your bladder.  You may develop or continue to have heartburn as a result of your pregnancy.  You may develop constipation because certain hormones are causing the muscles that push waste through your intestines to slow down.  You may develop hemorrhoids or swollen, bulging veins (varicose veins).  You may have back pain. This is caused by: ? Weight gain. ? Pregnancy hormones that are relaxing the joints in your pelvis. ? A shift in weight and the muscles that support your balance.  Your breasts will continue to grow and they will continue to become tender.  Your gums may bleed and  may be sensitive to brushing and flossing.  Dark spots or blotches (chloasma, mask of pregnancy) may develop on your face. This will likely fade after the baby is born.  A dark line from your belly button to the pubic area (linea nigra) may appear. This will likely fade after the baby is born.  You may have changes in your hair. These can include thickening of your hair, rapid growth, and changes in texture. Some women also have hair loss during or after pregnancy, or hair that feels dry or thin. Your hair will most likely return to normal after your baby is born.  What to expect at prenatal visits During a routine prenatal visit:  You will be weighed to make sure you and the fetus are growing normally.  Your blood pressure will be taken.  Your abdomen will be measured to track your baby's growth.  The fetal heartbeat will be listened to.  Any test results from the previous visit will be discussed.  Your health care provider may ask you:  How you are feeling.  If you are feeling the baby move.  If you have had any abnormal symptoms, such as leaking fluid, bleeding, severe headaches, or abdominal cramping.  If you are using any tobacco products, including cigarettes, chewing tobacco, and electronic cigarettes.  If you have any questions.  Other tests that may be performed during your second trimester include:  Blood tests that check for: ? Low iron levels (anemia). ? High blood  sugar that affects pregnant women (gestational diabetes) between 55 and 28 weeks. ? Rh antibodies. This is to check for a protein on red blood cells (Rh factor).  Urine tests to check for infections, diabetes, or protein in the urine.  An ultrasound to confirm the proper growth and development of the baby.  An amniocentesis to check for possible genetic problems.  Fetal screens for spina bifida and Down syndrome.  HIV (human immunodeficiency virus) testing. Routine prenatal testing includes  screening for HIV, unless you choose not to have this test.  Follow these instructions at home: Medicines  Follow your health care provider's instructions regarding medicine use. Specific medicines may be either safe or unsafe to take during pregnancy.  Take a prenatal vitamin that contains at least 600 micrograms (mcg) of folic acid.  If you develop constipation, try taking a stool softener if your health care provider approves. Eating and drinking  Eat a balanced diet that includes fresh fruits and vegetables, whole grains, good sources of protein such as meat, eggs, or tofu, and low-fat dairy. Your health care provider will help you determine the amount of weight gain that is right for you.  Avoid raw meat and uncooked cheese. These carry germs that can cause birth defects in the baby.  If you have low calcium intake from food, talk to your health care provider about whether you should take a daily calcium supplement.  Limit foods that are high in fat and processed sugars, such as fried and sweet foods.  To prevent constipation: ? Drink enough fluid to keep your urine clear or pale yellow. ? Eat foods that are high in fiber, such as fresh fruits and vegetables, whole grains, and beans. Activity  Exercise only as directed by your health care provider. Most women can continue their usual exercise routine during pregnancy. Try to exercise for 30 minutes at least 5 days a week. Stop exercising if you experience uterine contractions.  Avoid heavy lifting, wear low heel shoes, and practice good posture.  A sexual relationship may be continued unless your health care provider directs you otherwise. Relieving pain and discomfort  Wear a good support bra to prevent discomfort from breast tenderness.  Take warm sitz baths to soothe any pain or discomfort caused by hemorrhoids. Use hemorrhoid cream if your health care provider approves.  Rest with your legs elevated if you have leg cramps  or low back pain.  If you develop varicose veins, wear support hose. Elevate your feet for 15 minutes, 3-4 times a day. Limit salt in your diet. Prenatal Care  Write down your questions. Take them to your prenatal visits.  Keep all your prenatal visits as told by your health care provider. This is important. Safety  Wear your seat belt at all times when driving.  Make a list of emergency phone numbers, including numbers for family, friends, the hospital, and police and fire departments. General instructions  Ask your health care provider for a referral to a local prenatal education class. Begin classes no later than the beginning of month 6 of your pregnancy.  Ask for help if you have counseling or nutritional needs during pregnancy. Your health care provider can offer advice or refer you to specialists for help with various needs.  Do not use hot tubs, steam rooms, or saunas.  Do not douche or use tampons or scented sanitary pads.  Do not cross your legs for long periods of time.  Avoid cat litter boxes and soil used  by cats. These carry germs that can cause birth defects in the baby and possibly loss of the fetus by miscarriage or stillbirth.  Avoid all smoking, herbs, alcohol, and unprescribed drugs. Chemicals in these products can affect the formation and growth of the baby.  Do not use any products that contain nicotine or tobacco, such as cigarettes and e-cigarettes. If you need help quitting, ask your health care provider.  Visit your dentist if you have not gone yet during your pregnancy. Use a soft toothbrush to brush your teeth and be gentle when you floss. Contact a health care provider if:  You have dizziness.  You have mild pelvic cramps, pelvic pressure, or nagging pain in the abdominal area.  You have persistent nausea, vomiting, or diarrhea.  You have a bad smelling vaginal discharge.  You have pain when you urinate. Get help right away if:  You have a  fever.  You are leaking fluid from your vagina.  You have spotting or bleeding from your vagina.  You have severe abdominal cramping or pain.  You have rapid weight gain or weight loss.  You have shortness of breath with chest pain.  You notice sudden or extreme swelling of your face, hands, ankles, feet, or legs.  You have not felt your baby move in over an hour.  You have severe headaches that do not go away when you take medicine.  You have vision changes. Summary  The second trimester is from week 14 through week 27 (months 4 through 6). It is also a time when the fetus is growing rapidly.  Your body goes through many changes during pregnancy. The changes vary from woman to woman.  Avoid all smoking, herbs, alcohol, and unprescribed drugs. These chemicals affect the formation and growth your baby.  Do not use any tobacco products, such as cigarettes, chewing tobacco, and e-cigarettes. If you need help quitting, ask your health care provider.  Contact your health care provider if you have any questions. Keep all prenatal visits as told by your health care provider. This is important. This information is not intended to replace advice given to you by your health care provider. Make sure you discuss any questions you have with your health care provider. Document Released: 12/01/2001 Document Revised: 05/14/2016 Document Reviewed: 02/07/2013 Elsevier Interactive Patient Education  2017 [name].

## 2017-11-30 LAB — INTEGRATED 1
Crown Rump Length: 60.6 mm
Gest. Age on Collection Date: 12.4 weeks
MATERNAL AGE AT EDD: 29.5 a
Nuchal Translucency (NT): 1.5 mm
Number of Fetuses: 1
PAPP-A VALUE: 817.6 ng/mL
Weight: 176 [lb_av]

## 2017-12-06 ENCOUNTER — Telehealth: Payer: Self-pay | Admitting: Women's Health

## 2017-12-06 NOTE — Telephone Encounter (Signed)
Pt called concerned about the wording of the results from her NT ultrasound. She states that it says pregnancy complicated by smoker. She states that she is a former smoker. Advised pt that it was nothing to be concerned with and that we have documented that she is a former smoker.

## 2017-12-21 NOTE — L&D Delivery Note (Signed)
Delivery Note At 2:34 AM a viable female was delivered via  NSVD/vertex(Presentation:OA ;  ).  APGAR:9/9 , ; weight  pending.   Placenta status: intact, .  Cord: 3 vessel  with the following complications: none .  Cord pH: n/a  Anesthesia:  epidural Episiotomy:  none Lacerations:  2nd degree perineal Suture Repair: 3.0 monocryl Est. Blood Loss (mL):  250  Mom to postpartum.  Baby to Couplet care / Skin to Skin.  Lindsey Jensen Lindsey Jensen 06/16/2018, 3:00 AM

## 2017-12-23 ENCOUNTER — Ambulatory Visit (INDEPENDENT_AMBULATORY_CARE_PROVIDER_SITE_OTHER): Payer: BLUE CROSS/BLUE SHIELD | Admitting: Women's Health

## 2017-12-23 ENCOUNTER — Encounter: Payer: Self-pay | Admitting: Women's Health

## 2017-12-23 ENCOUNTER — Other Ambulatory Visit: Payer: Self-pay

## 2017-12-23 VITALS — BP 116/62 | HR 78 | Wt 182.0 lb

## 2017-12-23 DIAGNOSIS — Z331 Pregnant state, incidental: Secondary | ICD-10-CM

## 2017-12-23 DIAGNOSIS — Z1389 Encounter for screening for other disorder: Secondary | ICD-10-CM

## 2017-12-23 DIAGNOSIS — Z363 Encounter for antenatal screening for malformations: Secondary | ICD-10-CM

## 2017-12-23 DIAGNOSIS — Z3402 Encounter for supervision of normal first pregnancy, second trimester: Secondary | ICD-10-CM

## 2017-12-23 DIAGNOSIS — Z3A16 16 weeks gestation of pregnancy: Secondary | ICD-10-CM

## 2017-12-23 DIAGNOSIS — Z1379 Encounter for other screening for genetic and chromosomal anomalies: Secondary | ICD-10-CM

## 2017-12-23 LAB — POCT URINALYSIS DIPSTICK
Blood, UA: NEGATIVE
Glucose, UA: NEGATIVE
Ketones, UA: NEGATIVE
LEUKOCYTES UA: NEGATIVE
NITRITE UA: NEGATIVE
PROTEIN UA: NEGATIVE

## 2017-12-23 NOTE — Patient Instructions (Signed)
Lindsey Jensen, I greatly value your feedback.  If you receive a survey following your visit with us today, we appreciate you taking the time to fill it out.  Thanks, [name], CNM, WHNP-BC   Second Trimester of Pregnancy The second trimester is from week 14 through week 27 (months 4 through 6). The second trimester is often a time when you feel your best. Your body has adjusted to being pregnant, and you begin to feel better physically. Usually, morning sickness has lessened or quit completely, you may have more energy, and you may have an increase in appetite. The second trimester is also a time when the fetus is growing rapidly. At the end of the sixth month, the fetus is about 9 inches long and weighs about 1 pounds. You will likely begin to feel the baby move (quickening) between 16 and 20 weeks of pregnancy. Body changes during your second trimester Your body continues to go through many changes during your second trimester. The changes vary from woman to woman.  Your weight will continue to increase. You will notice your lower abdomen bulging out.  You may begin to get stretch marks on your hips, abdomen, and breasts.  You may develop headaches that can be relieved by medicines. The medicines should be approved by your health care provider.  You may urinate more often because the fetus is pressing on your bladder.  You may develop or continue to have heartburn as a result of your pregnancy.  You may develop constipation because certain hormones are causing the muscles that push waste through your intestines to slow down.  You may develop hemorrhoids or swollen, bulging veins (varicose veins).  You may have back pain. This is caused by: ? Weight gain. ? Pregnancy hormones that are relaxing the joints in your pelvis. ? A shift in weight and the muscles that support your balance.  Your breasts will continue to grow and they will continue to become tender.  Your gums may bleed and  may be sensitive to brushing and flossing.  Dark spots or blotches (chloasma, mask of pregnancy) may develop on your face. This will likely fade after the baby is born.  A dark line from your belly button to the pubic area (linea nigra) may appear. This will likely fade after the baby is born.  You may have changes in your hair. These can include thickening of your hair, rapid growth, and changes in texture. Some women also have hair loss during or after pregnancy, or hair that feels dry or thin. Your hair will most likely return to normal after your baby is born.  What to expect at prenatal visits During a routine prenatal visit:  You will be weighed to make sure you and the fetus are growing normally.  Your blood pressure will be taken.  Your abdomen will be measured to track your baby's growth.  The fetal heartbeat will be listened to.  Any test results from the previous visit will be discussed.  Your health care provider may ask you:  How you are feeling.  If you are feeling the baby move.  If you have had any abnormal symptoms, such as leaking fluid, bleeding, severe headaches, or abdominal cramping.  If you are using any tobacco products, including cigarettes, chewing tobacco, and electronic cigarettes.  If you have any questions.  Other tests that may be performed during your second trimester include:  Blood tests that check for: ? Low iron levels (anemia). ? High blood   sugar that affects pregnant women (gestational diabetes) between 55 and 28 weeks. ? Rh antibodies. This is to check for a protein on red blood cells (Rh factor).  Urine tests to check for infections, diabetes, or protein in the urine.  An ultrasound to confirm the proper growth and development of the baby.  An amniocentesis to check for possible genetic problems.  Fetal screens for spina bifida and Down syndrome.  HIV (human immunodeficiency virus) testing. Routine prenatal testing includes  screening for HIV, unless you choose not to have this test.  Follow these instructions at home: Medicines  Follow your health care provider's instructions regarding medicine use. Specific medicines may be either safe or unsafe to take during pregnancy.  Take a prenatal vitamin that contains at least 600 micrograms (mcg) of folic acid.  If you develop constipation, try taking a stool softener if your health care provider approves. Eating and drinking  Eat a balanced diet that includes fresh fruits and vegetables, whole grains, good sources of protein such as meat, eggs, or tofu, and low-fat dairy. Your health care provider will help you determine the amount of weight gain that is right for you.  Avoid raw meat and uncooked cheese. These carry germs that can cause birth defects in the baby.  If you have low calcium intake from food, talk to your health care provider about whether you should take a daily calcium supplement.  Limit foods that are high in fat and processed sugars, such as fried and sweet foods.  To prevent constipation: ? Drink enough fluid to keep your urine clear or pale yellow. ? Eat foods that are high in fiber, such as fresh fruits and vegetables, whole grains, and beans. Activity  Exercise only as directed by your health care provider. Most women can continue their usual exercise routine during pregnancy. Try to exercise for 30 minutes at least 5 days a week. Stop exercising if you experience uterine contractions.  Avoid heavy lifting, wear low heel shoes, and practice good posture.  A sexual relationship may be continued unless your health care provider directs you otherwise. Relieving pain and discomfort  Wear a good support bra to prevent discomfort from breast tenderness.  Take warm sitz baths to soothe any pain or discomfort caused by hemorrhoids. Use hemorrhoid cream if your health care provider approves.  Rest with your legs elevated if you have leg cramps  or low back pain.  If you develop varicose veins, wear support hose. Elevate your feet for 15 minutes, 3-4 times a day. Limit salt in your diet. Prenatal Care  Write down your questions. Take them to your prenatal visits.  Keep all your prenatal visits as told by your health care provider. This is important. Safety  Wear your seat belt at all times when driving.  Make a list of emergency phone numbers, including numbers for family, friends, the hospital, and police and fire departments. General instructions  Ask your health care provider for a referral to a local prenatal education class. Begin classes no later than the beginning of month 6 of your pregnancy.  Ask for help if you have counseling or nutritional needs during pregnancy. Your health care provider can offer advice or refer you to specialists for help with various needs.  Do not use hot tubs, steam rooms, or saunas.  Do not douche or use tampons or scented sanitary pads.  Do not cross your legs for long periods of time.  Avoid cat litter boxes and soil used  by cats. These carry germs that can cause birth defects in the baby and possibly loss of the fetus by miscarriage or stillbirth.  Avoid all smoking, herbs, alcohol, and unprescribed drugs. Chemicals in these products can affect the formation and growth of the baby.  Do not use any products that contain nicotine or tobacco, such as cigarettes and e-cigarettes. If you need help quitting, ask your health care provider.  Visit your dentist if you have not gone yet during your pregnancy. Use a soft toothbrush to brush your teeth and be gentle when you floss. Contact a health care provider if:  You have dizziness.  You have mild pelvic cramps, pelvic pressure, or nagging pain in the abdominal area.  You have persistent nausea, vomiting, or diarrhea.  You have a bad smelling vaginal discharge.  You have pain when you urinate. Get help right away if:  You have a  fever.  You are leaking fluid from your vagina.  You have spotting or bleeding from your vagina.  You have severe abdominal cramping or pain.  You have rapid weight gain or weight loss.  You have shortness of breath with chest pain.  You notice sudden or extreme swelling of your face, hands, ankles, feet, or legs.  You have not felt your baby move in over an hour.  You have severe headaches that do not go away when you take medicine.  You have vision changes. Summary  The second trimester is from week 14 through week 27 (months 4 through 6). It is also a time when the fetus is growing rapidly.  Your body goes through many changes during pregnancy. The changes vary from woman to woman.  Avoid all smoking, herbs, alcohol, and unprescribed drugs. These chemicals affect the formation and growth your baby.  Do not use any tobacco products, such as cigarettes, chewing tobacco, and e-cigarettes. If you need help quitting, ask your health care provider.  Contact your health care provider if you have any questions. Keep all prenatal visits as told by your health care provider. This is important. This information is not intended to replace advice given to you by your health care provider. Make sure you discuss any questions you have with your health care provider. Document Released: 12/01/2001 Document Revised: 05/14/2016 Document Reviewed: 02/07/2013 Elsevier Interactive Patient Education  2017 [name].

## 2017-12-23 NOTE — Progress Notes (Signed)
   LOW-RISK PREGNANCY VISIT Patient name: Lindsey Jensen MRN [id number]  Date of birth: September 08, 1986 Chief Complaint:   Routine Prenatal Visit (2nd IT)  History of Present Illness:   Lindsey Jensen is a 32 y.o. 391P0000 female at 6858w0d with an Estimated Date of Delivery: 06/09/18 being seen today for ongoing management of a low-risk pregnancy.  Today she reports found an extra piece of skin at entrance to vagina ~2wks ago, no pain/no changes. Contractions: Irritability. Vag. Bleeding: None.  Movement: Absent. denies leaking of fluid. Review of Systems:   Pertinent items are noted in HPI Denies abnormal vaginal discharge w/ itching/odor/irritation, headaches, visual changes, shortness of breath, chest pain, abdominal pain, severe nausea/vomiting, or problems with urination or bowel movements unless otherwise stated above. Pertinent History Reviewed:  Reviewed past medical,surgical, social, obstetrical and family history.  Reviewed problem list, medications and allergies. Physical Assessment:   Vitals:   12/23/17 1548  BP: 116/62  Pulse: 78  Weight: 182 lb (82.6 kg)  Body mass index is 27.67 kg/m.        Physical Examination:   General appearance: Well appearing, and in no distress  Mental status: Alert, oriented to person, place, and time  Skin: Warm & dry  Cardiovascular: Normal heart rate noted  Respiratory: Normal respiratory effort, no distress  Abdomen: Soft, gravid, nontender  Pelvic: 'extra piece of skin' is pedunculated hymenal remnant         Extremities: Edema: Trace  Fetal Status: Fetal Heart Rate (bpm): 155   Movement: Absent    Results for orders placed or performed in visit on 12/23/17 (from the past 24 hour(s))  POCT urinalysis dipstick   Collection Time: 12/23/17  3:50 PM  Result Value Ref Range   Color, UA     Clarity, UA     Glucose, UA neg    Bilirubin, UA     Ketones, UA neg    Spec Grav, UA  1.010 - 1.025   Blood, UA neg    pH, UA  5.0 - 8.0   Protein, UA neg     Urobilinogen, UA  0.2 or 1.0 E.U./dL   Nitrite, UA neg    Leukocytes, UA Negative Negative   Appearance     Odor      Assessment & Plan:  1) Low-risk pregnancy [name] at 4458w0d with an Estimated Date of Delivery: 06/09/18    Labs/procedures today: 2nd IT  Plan:  Continue routine obstetrical care   Reviewed: Preterm labor symptoms and general obstetric precautions including but not limited to vaginal bleeding, contractions, leaking of fluid and fetal movement were reviewed in detail with the patient.  All questions were answered  Follow-up: Return in about 2 weeks (around 01/06/2018) for LROB, WU:[id number]:Anatomy.  Orders Placed This Encounter  Procedures  . US OB Comp + 14 Wk  . INTEGRATED 2  . POCT urinalysis dipstick   Marge DuncansBooker, Avamarie Crossley Randall CNM, [facility]NP-BC 12/23/2017 4:14 PM

## 2017-12-27 LAB — INTEGRATED 2
ADSF: 0.66
AFP MARKER: 22.3 ng/mL
AFP MoM: 0.78
CROWN RUMP LENGTH: 60.6 mm
DIA MOM: 1.51
DIA VALUE: 230.2 pg/mL
ESTRIOL UNCONJUGATED: 0.55 ng/mL
GESTATIONAL AGE: 16.4 wk
Gest. Age on Collection Date: 12.4 weeks
HCG MOM: 1.32
Maternal Age at EDD: 29.5 yr
NUCHAL TRANSLUCENCY (NT): 1.5 mm
NUCHAL TRANSLUCENCY MOM: 0.97
Number of Fetuses: 1
PAPP-A MoM: 1.02
PAPP-A Value: 817.6 ng/mL
TEST RESULTS: NEGATIVE
Weight: 176 [lb_av]
Weight: 176 [lb_av]
hCG Value: 37.2 IU/mL

## 2018-01-11 ENCOUNTER — Ambulatory Visit (INDEPENDENT_AMBULATORY_CARE_PROVIDER_SITE_OTHER): Payer: BLUE CROSS/BLUE SHIELD

## 2018-01-11 ENCOUNTER — Encounter: Payer: Self-pay | Admitting: Women's Health

## 2018-01-11 ENCOUNTER — Other Ambulatory Visit: Payer: Self-pay

## 2018-01-11 ENCOUNTER — Ambulatory Visit (INDEPENDENT_AMBULATORY_CARE_PROVIDER_SITE_OTHER): Payer: BLUE CROSS/BLUE SHIELD | Admitting: Women's Health

## 2018-01-11 VITALS — BP 110/66 | HR 80 | Wt 181.0 lb

## 2018-01-11 DIAGNOSIS — O99332 Smoking (tobacco) complicating pregnancy, second trimester: Secondary | ICD-10-CM

## 2018-01-11 DIAGNOSIS — Z3A18 18 weeks gestation of pregnancy: Secondary | ICD-10-CM | POA: Diagnosis not present

## 2018-01-11 DIAGNOSIS — Z1389 Encounter for screening for other disorder: Secondary | ICD-10-CM

## 2018-01-11 DIAGNOSIS — Z331 Pregnant state, incidental: Secondary | ICD-10-CM

## 2018-01-11 DIAGNOSIS — Z3402 Encounter for supervision of normal first pregnancy, second trimester: Secondary | ICD-10-CM

## 2018-01-11 DIAGNOSIS — Z363 Encounter for antenatal screening for malformations: Secondary | ICD-10-CM

## 2018-01-11 LAB — POCT URINALYSIS DIPSTICK
Blood, UA: NEGATIVE
GLUCOSE UA: NEGATIVE
Ketones, UA: NEGATIVE
LEUKOCYTES UA: NEGATIVE
NITRITE UA: NEGATIVE
PROTEIN UA: NEGATIVE

## 2018-01-11 NOTE — Progress Notes (Signed)
US 18+5 wks,cephalic,anterior pl gr 0,normal ovaries bilat,fhr 144 bpm,cx 3.1 cm,svp of fluid 6 cm,efw 282 g,anatomy complete,no obvious abnormalities

## 2018-01-11 NOTE — Progress Notes (Signed)
   LOW-RISK PREGNANCY VISIT Patient name: Lindsey Jensen MRN [id number]  Date of birth: Dec 15, 1986 Chief Complaint:   Routine Prenatal Visit (u/s today)  History of Present Illness:   Lindsey Jensen is a 32 y.o. 321P0000 female at 313w5d with an Estimated Date of Delivery: 06/09/18 being seen today for ongoing management of a low-risk pregnancy.  Today she reports no complaints. Contractions: Not present. Vag. Bleeding: None.  Movement: Present. denies leaking of fluid. Review of Systems:   Pertinent items are noted in HPI Denies abnormal vaginal discharge w/ itching/odor/irritation, headaches, visual changes, shortness of breath, chest pain, abdominal pain, severe nausea/vomiting, or problems with urination or bowel movements unless otherwise stated above. Pertinent History Reviewed:  Reviewed past medical,surgical, social, obstetrical and family history.  Reviewed problem list, medications and allergies. Physical Assessment:   Vitals:   01/11/18 1629  BP: 110/66  Pulse: 80  Weight: 181 lb (82.1 kg)  Body mass index is 27.52 kg/m.        Physical Examination:   General appearance: Well appearing, and in no distress  Mental status: Alert, oriented to person, place, and time  Skin: Warm & dry  Cardiovascular: Normal heart rate noted  Respiratory: Normal respiratory effort, no distress  Abdomen: Soft, gravid, nontender  Pelvic: Cervical exam deferred         Extremities: Edema: None  Fetal Status: Fetal Heart Rate (bpm): 144 u/s   Movement: Present    US 18+5 wks,cephalic,anterior pl gr 0,normal ovaries bilat,fhr 144 bpm,cx 3.1 cm,svp of fluid 6 cm,efw 282 g,anatomy complete,no obvious abnormalities  Results for orders placed or performed in visit on 01/11/18 (from the past 24 hour(s))  POCT urinalysis dipstick   Collection Time: 01/11/18  4:26 PM  Result Value Ref Range   Color, UA     Clarity, UA     Glucose, UA neg    Bilirubin, UA     Ketones, UA neg    Spec Grav, UA  1.010 -  1.025   Blood, UA neg    pH, UA  5.0 - 8.0   Protein, UA neg    Urobilinogen, UA  0.2 or 1.0 E.U./dL   Nitrite, UA neg    Leukocytes, UA Negative Negative   Appearance     Odor      Assessment & Plan:  1) Low-risk pregnancy [name] at 223w5d with an Estimated Date of Delivery: 06/09/18    Meds: No orders of the defined types were placed in this encounter.  Labs/procedures today: anatomy u/s  Plan:  Continue routine obstetrical care   Reviewed: Preterm labor symptoms and general obstetric precautions including but not limited to vaginal bleeding, contractions, leaking of fluid and fetal movement were reviewed in detail with the patient.  All questions were answered  Follow-up: Return in about 4 weeks (around 02/08/2018) for LROB.  Orders Placed This Encounter  Procedures  . POCT urinalysis dipstick   Marge DuncansBooker, Catelyn Friel Randall CNM, [facility] 01/11/2018 4:58 PM

## 2018-01-11 NOTE — Patient Instructions (Signed)
Lindsey Jensen, I greatly value your feedback.  If you receive a survey following your visit with us today, we appreciate you taking the time to fill it out.  Thanks, [name], CNM, WHNP-BC   Second Trimester of Pregnancy The second trimester is from week 14 through week 27 (months 4 through 6). The second trimester is often a time when you feel your best. Your body has adjusted to being pregnant, and you begin to feel better physically. Usually, morning sickness has lessened or quit completely, you may have more energy, and you may have an increase in appetite. The second trimester is also a time when the fetus is growing rapidly. At the end of the sixth month, the fetus is about 9 inches long and weighs about 1 pounds. You will likely begin to feel the baby move (quickening) between 16 and 20 weeks of pregnancy. Body changes during your second trimester Your body continues to go through many changes during your second trimester. The changes vary from woman to woman.  Your weight will continue to increase. You will notice your lower abdomen bulging out.  You may begin to get stretch marks on your hips, abdomen, and breasts.  You may develop headaches that can be relieved by medicines. The medicines should be approved by your health care provider.  You may urinate more often because the fetus is pressing on your bladder.  You may develop or continue to have heartburn as a result of your pregnancy.  You may develop constipation because certain hormones are causing the muscles that push waste through your intestines to slow down.  You may develop hemorrhoids or swollen, bulging veins (varicose veins).  You may have back pain. This is caused by: ? Weight gain. ? Pregnancy hormones that are relaxing the joints in your pelvis. ? A shift in weight and the muscles that support your balance.  Your breasts will continue to grow and they will continue to become tender.  Your gums may bleed and  may be sensitive to brushing and flossing.  Dark spots or blotches (chloasma, mask of pregnancy) may develop on your face. This will likely fade after the baby is born.  A dark line from your belly button to the pubic area (linea nigra) may appear. This will likely fade after the baby is born.  You may have changes in your hair. These can include thickening of your hair, rapid growth, and changes in texture. Some women also have hair loss during or after pregnancy, or hair that feels dry or thin. Your hair will most likely return to normal after your baby is born.  What to expect at prenatal visits During a routine prenatal visit:  You will be weighed to make sure you and the fetus are growing normally.  Your blood pressure will be taken.  Your abdomen will be measured to track your baby's growth.  The fetal heartbeat will be listened to.  Any test results from the previous visit will be discussed.  Your health care provider may ask you:  How you are feeling.  If you are feeling the baby move.  If you have had any abnormal symptoms, such as leaking fluid, bleeding, severe headaches, or abdominal cramping.  If you are using any tobacco products, including cigarettes, chewing tobacco, and electronic cigarettes.  If you have any questions.  Other tests that may be performed during your second trimester include:  Blood tests that check for: ? Low iron levels (anemia). ? High blood  sugar that affects pregnant women (gestational diabetes) between 55 and 28 weeks. ? Rh antibodies. This is to check for a protein on red blood cells (Rh factor).  Urine tests to check for infections, diabetes, or protein in the urine.  An ultrasound to confirm the proper growth and development of the baby.  An amniocentesis to check for possible genetic problems.  Fetal screens for spina bifida and Down syndrome.  HIV (human immunodeficiency virus) testing. Routine prenatal testing includes  screening for HIV, unless you choose not to have this test.  Follow these instructions at home: Medicines  Follow your health care provider's instructions regarding medicine use. Specific medicines may be either safe or unsafe to take during pregnancy.  Take a prenatal vitamin that contains at least 600 micrograms (mcg) of folic acid.  If you develop constipation, try taking a stool softener if your health care provider approves. Eating and drinking  Eat a balanced diet that includes fresh fruits and vegetables, whole grains, good sources of protein such as meat, eggs, or tofu, and low-fat dairy. Your health care provider will help you determine the amount of weight gain that is right for you.  Avoid raw meat and uncooked cheese. These carry germs that can cause birth defects in the baby.  If you have low calcium intake from food, talk to your health care provider about whether you should take a daily calcium supplement.  Limit foods that are high in fat and processed sugars, such as fried and sweet foods.  To prevent constipation: ? Drink enough fluid to keep your urine clear or pale yellow. ? Eat foods that are high in fiber, such as fresh fruits and vegetables, whole grains, and beans. Activity  Exercise only as directed by your health care provider. Most women can continue their usual exercise routine during pregnancy. Try to exercise for 30 minutes at least 5 days a week. Stop exercising if you experience uterine contractions.  Avoid heavy lifting, wear low heel shoes, and practice good posture.  A sexual relationship may be continued unless your health care provider directs you otherwise. Relieving pain and discomfort  Wear a good support bra to prevent discomfort from breast tenderness.  Take warm sitz baths to soothe any pain or discomfort caused by hemorrhoids. Use hemorrhoid cream if your health care provider approves.  Rest with your legs elevated if you have leg cramps  or low back pain.  If you develop varicose veins, wear support hose. Elevate your feet for 15 minutes, 3-4 times a day. Limit salt in your diet. Prenatal Care  Write down your questions. Take them to your prenatal visits.  Keep all your prenatal visits as told by your health care provider. This is important. Safety  Wear your seat belt at all times when driving.  Make a list of emergency phone numbers, including numbers for family, friends, the hospital, and police and fire departments. General instructions  Ask your health care provider for a referral to a local prenatal education class. Begin classes no later than the beginning of month 6 of your pregnancy.  Ask for help if you have counseling or nutritional needs during pregnancy. Your health care provider can offer advice or refer you to specialists for help with various needs.  Do not use hot tubs, steam rooms, or saunas.  Do not douche or use tampons or scented sanitary pads.  Do not cross your legs for long periods of time.  Avoid cat litter boxes and soil used  by cats. These carry germs that can cause birth defects in the baby and possibly loss of the fetus by miscarriage or stillbirth.  Avoid all smoking, herbs, alcohol, and unprescribed drugs. Chemicals in these products can affect the formation and growth of the baby.  Do not use any products that contain nicotine or tobacco, such as cigarettes and e-cigarettes. If you need help quitting, ask your health care provider.  Visit your dentist if you have not gone yet during your pregnancy. Use a soft toothbrush to brush your teeth and be gentle when you floss. Contact a health care provider if:  You have dizziness.  You have mild pelvic cramps, pelvic pressure, or nagging pain in the abdominal area.  You have persistent nausea, vomiting, or diarrhea.  You have a bad smelling vaginal discharge.  You have pain when you urinate. Get help right away if:  You have a  fever.  You are leaking fluid from your vagina.  You have spotting or bleeding from your vagina.  You have severe abdominal cramping or pain.  You have rapid weight gain or weight loss.  You have shortness of breath with chest pain.  You notice sudden or extreme swelling of your face, hands, ankles, feet, or legs.  You have not felt your baby move in over an hour.  You have severe headaches that do not go away when you take medicine.  You have vision changes. Summary  The second trimester is from week 14 through week 27 (months 4 through 6). It is also a time when the fetus is growing rapidly.  Your body goes through many changes during pregnancy. The changes vary from woman to woman.  Avoid all smoking, herbs, alcohol, and unprescribed drugs. These chemicals affect the formation and growth your baby.  Do not use any tobacco products, such as cigarettes, chewing tobacco, and e-cigarettes. If you need help quitting, ask your health care provider.  Contact your health care provider if you have any questions. Keep all prenatal visits as told by your health care provider. This is important. This information is not intended to replace advice given to you by your health care provider. Make sure you discuss any questions you have with your health care provider. Document Released: 12/01/2001 Document Revised: 05/14/2016 Document Reviewed: 02/07/2013 Elsevier Interactive Patient Education  2017 [name].

## 2018-01-26 ENCOUNTER — Telehealth: Payer: Self-pay | Admitting: *Deleted

## 2018-01-26 MED ORDER — OSELTAMIVIR PHOSPHATE 75 MG PO CAPS: 75.0000 mg | ORAL_CAPSULE | Freq: Two times a day (BID) | ORAL | 0 refills | Status: DC

## 2018-01-26 NOTE — Addendum Note (Signed)
Addended by: Cheral MarkerBOOKER, Azlaan Isidore R on: 01/26/2018 11:48 AM   Modules accepted: Orders

## 2018-01-26 NOTE — Telephone Encounter (Signed)
Per RN:   Patient states she has a bad cough, yellow/green mucous, temp 101 during the night which started yesterday. Boyfriend had same symptoms which started Friday and was given zpack. Encouraged to push fluids, take Tylenol to keep temp down and try Mucinex to loosen mucous. Patient need to be seen or does she just need antibiotic? Please advise  Rx Tamiflu BID x 5d, rest, hydration, let us know if worsening/not improving.  Cheral MarkerKimberly R. Camyla Camposano, CNM, [facility]NP[facility] 01/26/2018 11:48 AM

## 2018-01-26 NOTE — Telephone Encounter (Signed)
Patient states she has a bad cough, yellow/green mucous, temp 101 during the night which started yesterday. Boyfriend had same symptoms which started Friday and was given zpack. Encouraged to push fluids, take Tylenol to keep temp down and try Mucinex to loosen mucous. Patient need to be seen or does she just need antibiotic? Please advise.

## 2018-01-28 ENCOUNTER — Telehealth: Payer: Self-pay | Admitting: *Deleted

## 2018-01-28 MED ORDER — AZITHROMYCIN 250 MG PO TABS: ORAL_TABLET | ORAL | 0 refills | Status: DC

## 2018-01-28 NOTE — Telephone Encounter (Signed)
Patient states she is very congested and Mucinex has not helped. She states she is now getting better after getting worse and is just coughing to the point that her back is hurting. Going on day 6 of symptoms. She has a history of bronchitis and doesn't want it to get to that point. Discussed with Dr Emelda FearFerguson and will send in z-pak,

## 2018-02-17 ENCOUNTER — Encounter: Payer: Self-pay | Admitting: Women's Health

## 2018-02-17 ENCOUNTER — Other Ambulatory Visit: Payer: Self-pay

## 2018-02-17 ENCOUNTER — Ambulatory Visit (INDEPENDENT_AMBULATORY_CARE_PROVIDER_SITE_OTHER): Payer: BLUE CROSS/BLUE SHIELD | Admitting: Women's Health

## 2018-02-17 VITALS — BP 104/58 | HR 66 | Wt 188.0 lb

## 2018-02-17 DIAGNOSIS — Z1389 Encounter for screening for other disorder: Secondary | ICD-10-CM

## 2018-02-17 DIAGNOSIS — Z331 Pregnant state, incidental: Secondary | ICD-10-CM

## 2018-02-17 DIAGNOSIS — Z3402 Encounter for supervision of normal first pregnancy, second trimester: Secondary | ICD-10-CM

## 2018-02-17 DIAGNOSIS — Z3A24 24 weeks gestation of pregnancy: Secondary | ICD-10-CM

## 2018-02-17 LAB — POCT URINALYSIS DIPSTICK
Glucose, UA: NEGATIVE
Ketones, UA: NEGATIVE
NITRITE UA: NEGATIVE
PROTEIN UA: NEGATIVE
RBC UA: NEGATIVE

## 2018-02-17 NOTE — Patient Instructions (Signed)
Lindsey Jensen, I greatly value your feedback.  If you receive a survey following your visit with us today, we appreciate you taking the time to fill it out.  Thanks, Joellyn HaffKim Deidre Carino, CNM, WHNP-BC   You will have your sugar test next visit.  Please do not eat or drink anything after midnight the night before you come, not even water.  You will be here for at least two hours.     Call the office [phone]) or [facility] to [facility] if:  You begin to have strong, frequent contractions  Your water breaks.  Sometimes it is a big gush of fluid, sometimes it is just a trickle that keeps getting your panties wet or running down your legs  You have vaginal bleeding.  It is normal to have a small amount of spotting if your cervix was checked.   You don't feel your baby moving like normal.  If you don't, get you something to eat and drink and lay down and focus on feeling your baby move.   If your baby is still not moving like normal, you should call the office or go to [facility].  Second Trimester of Pregnancy The second trimester is from week 13 through week 28, months 4 through 6. The second trimester is often a time when you feel your best. Your body has also adjusted to being pregnant, and you begin to feel better physically. Usually, morning sickness has lessened or quit completely, you may have more energy, and you may have an increase in appetite. The second trimester is also a time when the fetus is growing rapidly. At the end of the sixth month, the fetus is about 9 inches long and weighs about 1 pounds. You will likely begin to feel the baby move (quickening) between 18 and 20 weeks of the pregnancy. BODY CHANGES Your body goes through many changes during pregnancy. The changes vary from woman to woman.   Your weight will continue to increase. You will notice your lower abdomen bulging out.  You may begin to get stretch marks on your hips, abdomen, and breasts.  You may develop headaches  that can be relieved by medicines approved by your health care provider.  You may urinate more often because the fetus is pressing on your bladder.  You may develop or continue to have heartburn as a result of your pregnancy.  You may develop constipation because certain hormones are causing the muscles that push waste through your intestines to slow down.  You may develop hemorrhoids or swollen, bulging veins (varicose veins).  You may have back pain because of the weight gain and pregnancy hormones relaxing your joints between the bones in your pelvis and as a result of a shift in weight and the muscles that support your balance.  Your breasts will continue to grow and be tender.  Your gums may bleed and may be sensitive to brushing and flossing.  Dark spots or blotches (chloasma, mask of pregnancy) may develop on your face. This will likely fade after the baby is born.  A dark line from your belly button to the pubic area (linea nigra) may appear. This will likely fade after the baby is born.  You may have changes in your hair. These can include thickening of your hair, rapid growth, and changes in texture. Some women also have hair loss during or after pregnancy, or hair that feels dry or thin. Your hair will most likely return to normal after your baby  is born. WHAT TO EXPECT AT YOUR PRENATAL VISITS During a routine prenatal visit:  You will be weighed to make sure you and the fetus are growing normally.  Your blood pressure will be taken.  Your abdomen will be measured to track your baby's growth.  The fetal heartbeat will be listened to.  Any test results from the previous visit will be discussed. Your health care provider may ask you:  How you are feeling.  If you are feeling the baby move.  If you have had any abnormal symptoms, such as leaking fluid, bleeding, severe headaches, or abdominal cramping.  If you have any questions. Other tests that may be performed  during your second trimester include:  Blood tests that check for:  Low iron levels (anemia).  Gestational diabetes (between 24 and 28 weeks).  Rh antibodies.  Urine tests to check for infections, diabetes, or protein in the urine.  An ultrasound to confirm the proper growth and development of the baby.  An amniocentesis to check for possible genetic problems.  Fetal screens for spina bifida and Down syndrome. HOME CARE INSTRUCTIONS   Avoid all smoking, herbs, alcohol, and unprescribed drugs. These chemicals affect the formation and growth of the baby.  Follow your health care provider's instructions regarding medicine use. There are medicines that are either safe or unsafe to take during pregnancy.  Exercise only as directed by your health care provider. Experiencing uterine cramps is a good sign to stop exercising.  Continue to eat regular, healthy meals.  Wear a good support bra for breast tenderness.  Do not use hot tubs, steam rooms, or saunas.  Wear your seat belt at all times when driving.  Avoid raw meat, uncooked cheese, cat litter boxes, and soil used by cats. These carry germs that can cause birth defects in the baby.  Take your prenatal vitamins.  Try taking a stool softener (if your health care provider approves) if you develop constipation. Eat more high-fiber foods, such as fresh vegetables or fruit and whole grains. Drink plenty of fluids to keep your urine clear or pale yellow.  Take warm sitz baths to soothe any pain or discomfort caused by hemorrhoids. Use hemorrhoid cream if your health care provider approves.  If you develop varicose veins, wear support hose. Elevate your feet for 15 minutes, 3-4 times a day. Limit salt in your diet.  Avoid heavy lifting, wear low heel shoes, and practice good posture.  Rest with your legs elevated if you have leg cramps or low back pain.  Visit your dentist if you have not gone yet during your pregnancy. Use a soft  toothbrush to brush your teeth and be gentle when you floss.  A sexual relationship may be continued unless your health care provider directs you otherwise.  Continue to go to all your prenatal visits as directed by your health care provider. SEEK MEDICAL CARE IF:   You have dizziness.  You have mild pelvic cramps, pelvic pressure, or nagging pain in the abdominal area.  You have persistent nausea, vomiting, or diarrhea.  You have a bad smelling vaginal discharge.  You have pain with urination. SEEK IMMEDIATE MEDICAL CARE IF:   You have a fever.  You are leaking fluid from your vagina.  You have spotting or bleeding from your vagina.  You have severe abdominal cramping or pain.  You have rapid weight gain or loss.  You have shortness of breath with chest pain.  You notice sudden or extreme swelling  of your face, hands, ankles, feet, or legs.  You have not felt your baby move in over an hour.  You have severe headaches that do not go away with medicine.  You have vision changes. Document Released: 12/01/2001 Document Revised: 12/12/2013 Document Reviewed: 02/07/2013 [facility] Patient Information 2015 [place], [place]. This information is not intended to replace advice given to you by your health care provider. Make sure you discuss any questions you have with your health care provider.

## 2018-02-17 NOTE — Progress Notes (Signed)
   LOW-RISK PREGNANCY VISIT Patient name: Lindsey ClosGina Jensen MRN [id number]  Date of birth: January 24, 1986 Chief Complaint:   Routine Prenatal Visit  History of Present Illness:   Lindsey ClosGina Jensen is a 32 y.o. 121P0000 female at 1857w0d with an Estimated Date of Delivery: 06/09/18 being seen today for ongoing management of a low-risk pregnancy.  Today she reports no complaints. Contractions: Not present. Vag. Bleeding: None.  Movement: Present. denies leaking of fluid. Review of Systems:   Pertinent items are noted in HPI Denies abnormal vaginal discharge w/ itching/odor/irritation, headaches, visual changes, shortness of breath, chest pain, abdominal pain, severe nausea/vomiting, or problems with urination or bowel movements unless otherwise stated above. Pertinent History Reviewed:  Reviewed past medical,surgical, social, obstetrical and family history.  Reviewed problem list, medications and allergies. Physical Assessment:   Vitals:   02/17/18 1548  BP: (!) 104/58  Pulse: 66  Weight: 188 lb (85.3 kg)  Body mass index is 28.59 kg/m.        Physical Examination:   General appearance: Well appearing, and in no distress  Mental status: Alert, oriented to person, place, and time  Skin: Warm & dry  Cardiovascular: Normal heart rate noted  Respiratory: Normal respiratory effort, no distress  Abdomen: Soft, gravid, nontender  Pelvic: Cervical exam deferred         Extremities: Edema: Trace  Fetal Status: Fetal Heart Rate (bpm): 130 Fundal Height: 24 cm Movement: Present    Results for orders placed or performed in visit on 02/17/18 (from the past 24 hour(s))  POCT urinalysis dipstick   Collection Time: 02/17/18  3:50 PM  Result Value Ref Range   Color, UA     Clarity, UA     Glucose, UA neg    Bilirubin, UA     Ketones, UA neg    Spec Grav, UA  1.010 - 1.025   Blood, UA neg    pH, UA  5.0 - 8.0   Protein, UA neg    Urobilinogen, UA  0.2 or 1.0 E.U./dL   Nitrite, UA neg    Leukocytes, UA  Moderate (2+) (A) Negative   Appearance     Odor      Assessment & Plan:  1) Low-risk pregnancy [name] at 5257w0d with an Estimated Date of Delivery: 06/09/18    Meds: No orders of the defined types were placed in this encounter.  Labs/procedures today: none  Plan:  Continue routine obstetrical care   Reviewed: Preterm labor symptoms and general obstetric precautions including but not limited to vaginal bleeding, contractions, leaking of fluid and fetal movement were reviewed in detail with the patient.  All questions were answered  Follow-up: Return in about 4 weeks (around 03/17/2018) for LROB, PN2.  Orders Placed This Encounter  Procedures  . POCT urinalysis dipstick   Cheral MarkerKimberly R Georgiann Neider CNM, [facility]NP-BC 02/17/2018 5:03 PM

## 2018-03-17 ENCOUNTER — Other Ambulatory Visit: Payer: BLUE CROSS/BLUE SHIELD

## 2018-03-25 ENCOUNTER — Other Ambulatory Visit: Payer: Self-pay

## 2018-03-25 ENCOUNTER — Encounter: Payer: Self-pay | Admitting: Obstetrics & Gynecology

## 2018-03-25 ENCOUNTER — Other Ambulatory Visit: Payer: BLUE CROSS/BLUE SHIELD

## 2018-03-25 ENCOUNTER — Ambulatory Visit (INDEPENDENT_AMBULATORY_CARE_PROVIDER_SITE_OTHER): Payer: BLUE CROSS/BLUE SHIELD | Admitting: Obstetrics & Gynecology

## 2018-03-25 VITALS — BP 112/66 | HR 64 | Wt 196.0 lb

## 2018-03-25 DIAGNOSIS — Z3A29 29 weeks gestation of pregnancy: Secondary | ICD-10-CM

## 2018-03-25 DIAGNOSIS — Z3403 Encounter for supervision of normal first pregnancy, third trimester: Secondary | ICD-10-CM

## 2018-03-25 DIAGNOSIS — Z131 Encounter for screening for diabetes mellitus: Secondary | ICD-10-CM

## 2018-03-25 DIAGNOSIS — Z331 Pregnant state, incidental: Secondary | ICD-10-CM

## 2018-03-25 DIAGNOSIS — Z1389 Encounter for screening for other disorder: Secondary | ICD-10-CM

## 2018-03-25 LAB — POCT URINALYSIS DIPSTICK
Glucose, UA: NEGATIVE
KETONES UA: NEGATIVE
Leukocytes, UA: NEGATIVE
NITRITE UA: NEGATIVE
PROTEIN UA: NEGATIVE
RBC UA: NEGATIVE

## 2018-03-25 NOTE — Progress Notes (Signed)
[name] 6879w1d Estimated Date of Delivery: 06/09/18  Blood pressure 112/66, pulse 64, weight 196 lb (88.9 kg), last menstrual period 09/02/2017.   BP weight and urine results all reviewed and noted.  Please refer to the obstetrical flow sheet for the fundal height and fetal heart rate documentation:  Patient reports good fetal movement, denies any bleeding and no rupture of membranes symptoms or regular contractions. Patient is without complaints. All questions were answered.  Orders Placed This Encounter  Procedures  . POCT urinalysis dipstick    Plan:  Continued routine obstetrical care, PN2 today  Return in about 3 weeks (around 04/15/2018) for LROB.

## 2018-03-26 LAB — CBC
Hematocrit: 35.9 % (ref 34.0–46.6)
Hemoglobin: 12.3 g/dL (ref 11.1–15.9)
MCH: 31.4 pg (ref 26.6–33.0)
MCHC: 34.3 g/dL (ref 31.5–35.7)
MCV: 92 fL (ref 79–97)
PLATELETS: 185 10*3/uL (ref 150–379)
RBC: 3.92 x10E6/uL (ref 3.77–5.28)
RDW: 12.1 % — AB (ref 12.3–15.4)
WBC: 9.5 10*3/uL (ref 3.4–10.8)

## 2018-03-26 LAB — GLUCOSE TOLERANCE, 2 HOURS W/ 1HR
Glucose, 1 hour: 159 mg/dL (ref 65–179)
Glucose, 2 hour: 106 mg/dL (ref 65–152)
Glucose, Fasting: 81 mg/dL (ref 65–91)

## 2018-03-26 LAB — ANTIBODY SCREEN: Antibody Screen: NEGATIVE

## 2018-03-26 LAB — HIV ANTIBODY (ROUTINE TESTING W REFLEX): HIV Screen 4th Generation wRfx: NONREACTIVE

## 2018-03-26 LAB — RPR: RPR Ser Ql: NONREACTIVE

## 2018-04-14 ENCOUNTER — Encounter: Payer: Self-pay | Admitting: Women's Health

## 2018-04-14 ENCOUNTER — Ambulatory Visit (INDEPENDENT_AMBULATORY_CARE_PROVIDER_SITE_OTHER): Payer: BLUE CROSS/BLUE SHIELD | Admitting: Women's Health

## 2018-04-14 VITALS — BP 90/60 | HR 98 | Wt 199.0 lb

## 2018-04-14 DIAGNOSIS — Z3403 Encounter for supervision of normal first pregnancy, third trimester: Secondary | ICD-10-CM

## 2018-04-14 DIAGNOSIS — Z1389 Encounter for screening for other disorder: Secondary | ICD-10-CM

## 2018-04-14 DIAGNOSIS — Z331 Pregnant state, incidental: Secondary | ICD-10-CM

## 2018-04-14 DIAGNOSIS — Z3A32 32 weeks gestation of pregnancy: Secondary | ICD-10-CM

## 2018-04-14 LAB — POCT URINALYSIS DIPSTICK
GLUCOSE UA: NEGATIVE
Ketones, UA: NEGATIVE
Nitrite, UA: NEGATIVE
Protein, UA: NEGATIVE
RBC UA: NEGATIVE

## 2018-04-14 NOTE — Patient Instructions (Signed)
Lindsey Jensen, I greatly value your feedback.  If you receive a survey following your visit with us today, we appreciate you taking the time to fill it out.  Thanks, Joellyn HaffKim Jaylene Schrom, CNM, WHNP-BC   Call the office [phone]) or go to [facility] if:  You begin to have strong, frequent contractions  Your water breaks.  Sometimes it is a big gush of fluid, sometimes it is just a trickle that keeps getting your panties wet or running down your legs  You have vaginal bleeding.  It is normal to have a small amount of spotting if your cervix was checked.   You don't feel your baby moving like normal.  If you don't, get you something to eat and drink and lay down and focus on feeling your baby move.  You should feel at least 10 movements in 2 hours.  If you don't, you should call the office or go to [facility].    Tdap Vaccine  It is recommended that you get the Tdap vaccine during the third trimester of EACH pregnancy to help protect your baby from getting pertussis (whooping cough)  27-36 weeks is the BEST time to do this so that you can pass the protection on to your baby. During pregnancy is better than after pregnancy, but if you are unable to get it during pregnancy it will be offered at the hospital.   You can get this vaccine at the health department or your family doctor  Everyone who will be around your baby should also be up-to-date on their vaccines. Adults (who are not pregnant) only need 1 dose of Tdap during adulthood.   Third Trimester of Pregnancy The third trimester is from week 29 through week 42, months 7 through 9. The third trimester is a time when the fetus is growing rapidly. At the end of the ninth month, the fetus is about 20 inches in length and weighs 6-10 pounds.  BODY CHANGES Your body goes through many changes during pregnancy. The changes vary from woman to woman.   Your weight will continue to increase. You can expect to gain 25-35 pounds (11-16 kg) by the  end of the pregnancy.  You may begin to get stretch marks on your hips, abdomen, and breasts.  You may urinate more often because the fetus is moving lower into your pelvis and pressing on your bladder.  You may develop or continue to have heartburn as a result of your pregnancy.  You may develop constipation because certain hormones are causing the muscles that push waste through your intestines to slow down.  You may develop hemorrhoids or swollen, bulging veins (varicose veins).  You may have pelvic pain because of the weight gain and pregnancy hormones relaxing your joints between the bones in your pelvis. Backaches may result from overexertion of the muscles supporting your posture.  You may have changes in your hair. These can include thickening of your hair, rapid growth, and changes in texture. Some women also have hair loss during or after pregnancy, or hair that feels dry or thin. Your hair will most likely return to normal after your baby is born.  Your breasts will continue to grow and be tender. A yellow discharge may leak from your breasts called colostrum.  Your belly button may stick out.  You may feel short of breath because of your expanding uterus.  You may notice the fetus "dropping," or moving lower in your abdomen.  You may have a bloody mucus  discharge. This usually occurs a few days to a week before labor begins.  Your cervix becomes thin and soft (effaced) near your due date. WHAT TO EXPECT AT YOUR PRENATAL EXAMS  You will have prenatal exams every 2 weeks until week 36. Then, you will have weekly prenatal exams. During a routine prenatal visit:  You will be weighed to make sure you and the fetus are growing normally.  Your blood pressure is taken.  Your abdomen will be measured to track your baby's growth.  The fetal heartbeat will be listened to.  Any test results from the previous visit will be discussed.  You may have a cervical check near your due  date to see if you have effaced. At around 36 weeks, your caregiver will check your cervix. At the same time, your caregiver will also perform a test on the secretions of the vaginal tissue. This test is to determine if a type of bacteria, Group B streptococcus, is present. Your caregiver will explain this further. Your caregiver may ask you:  What your birth plan is.  How you are feeling.  If you are feeling the baby move.  If you have had any abnormal symptoms, such as leaking fluid, bleeding, severe headaches, or abdominal cramping.  If you have any questions. Other tests or screenings that may be performed during your third trimester include:  Blood tests that check for low iron levels (anemia).  Fetal testing to check the health, activity level, and growth of the fetus. Testing is done if you have certain medical conditions or if there are problems during the pregnancy. FALSE LABOR You may feel small, irregular contractions that eventually go away. These are called [name], or false labor. Contractions may last for hours, days, or even weeks before true labor sets in. If contractions come at regular intervals, intensify, or become painful, it is best to be seen by your caregiver.  SIGNS OF LABOR   Menstrual-like cramps.  Contractions that are 5 minutes apart or less.  Contractions that start on the top of the uterus and spread down to the lower abdomen and back.  A sense of increased pelvic pressure or back pain.  A watery or bloody mucus discharge that comes from the vagina. If you have any of these signs before the 37th week of pregnancy, call your caregiver right away. You need to go to the hospital to get checked immediately. HOME CARE INSTRUCTIONS   Avoid all smoking, herbs, alcohol, and unprescribed drugs. These chemicals affect the formation and growth of the baby.  Follow your caregiver's instructions regarding medicine use. There are medicines that  are either safe or unsafe to take during pregnancy.  Exercise only as directed by your caregiver. Experiencing uterine cramps is a good sign to stop exercising.  Continue to eat regular, healthy meals.  Wear a good support bra for breast tenderness.  Do not use hot tubs, steam rooms, or saunas.  Wear your seat belt at all times when driving.  Avoid raw meat, uncooked cheese, cat litter boxes, and soil used by cats. These carry germs that can cause birth defects in the baby.  Take your prenatal vitamins.  Try taking a stool softener (if your caregiver approves) if you develop constipation. Eat more high-fiber foods, such as fresh vegetables or fruit and whole grains. Drink plenty of fluids to keep your urine clear or pale yellow.  Take warm sitz baths to soothe any pain or discomfort caused by hemorrhoids. Use  hemorrhoid cream if your caregiver approves.  If you develop varicose veins, wear support hose. Elevate your feet for 15 minutes, 3-4 times a day. Limit salt in your diet.  Avoid heavy lifting, wear low heal shoes, and practice good posture.  Rest a lot with your legs elevated if you have leg cramps or low back pain.  Visit your dentist if you have not gone during your pregnancy. Use a soft toothbrush to brush your teeth and be gentle when you floss.  A sexual relationship may be continued unless your caregiver directs you otherwise.  Do not travel far distances unless it is absolutely necessary and only with the approval of your caregiver.  Take prenatal classes to understand, practice, and ask questions about the labor and delivery.  Make a trial run to the hospital.  Pack your hospital bag.  Prepare the baby's nursery.  Continue to go to all your prenatal visits as directed by your caregiver. SEEK MEDICAL CARE IF:  You are unsure if you are in labor or if your water has broken.  You have dizziness.  You have mild pelvic cramps, pelvic pressure, or nagging pain  in your abdominal area.  You have persistent nausea, vomiting, or diarrhea.  You have a bad smelling vaginal discharge.  You have pain with urination. SEEK IMMEDIATE MEDICAL CARE IF:   You have a fever.  You are leaking fluid from your vagina.  You have spotting or bleeding from your vagina.  You have severe abdominal cramping or pain.  You have rapid weight loss or gain.  You have shortness of breath with chest pain.  You notice sudden or extreme swelling of your face, hands, ankles, feet, or legs.  You have not felt your baby move in over an hour.  You have severe headaches that do not go away with medicine.  You have vision changes. Document Released: 12/01/2001 Document Revised: 12/12/2013 Document Reviewed: 02/07/2013 [facility] Patient Information 2015 [place], [place]. This information is not intended to replace advice given to you by your health care provider. Make sure you discuss any questions you have with your health care provider.

## 2018-04-14 NOTE — Progress Notes (Signed)
   LOW-RISK PREGNANCY VISIT Patient name: Lindsey Jensen MRN [id number]  Date of birth: [birth year] Chief Complaint:   Routine Prenatal Visit  History of Present Illness:   Lindsey Jensen is a 32 y.o. 261P0000 female at 1264w0d with an Estimated Date of Delivery: 06/09/18 being seen today for ongoing management of a low-risk pregnancy.  Today she reports no complaints. Contractions: Not present.  .  Movement: Present. denies leaking of fluid. Review of Systems:   Pertinent items are noted in HPI Denies abnormal vaginal discharge w/ itching/odor/irritation, headaches, visual changes, shortness of breath, chest pain, abdominal pain, severe nausea/vomiting, or problems with urination or bowel movements unless otherwise stated above. Pertinent History Reviewed:  Reviewed past medical,surgical, social, obstetrical and family history.  Reviewed problem list, medications and allergies. Physical Assessment:   Vitals:   04/14/18 1618  BP: 90/60  Pulse: 98  Weight: 199 lb (90.3 kg)  Body mass index is 30.26 kg/m.        Physical Examination:   General appearance: Well appearing, and in no distress  Mental status: Alert, oriented to person, place, and time  Skin: Warm & dry  Cardiovascular: Normal heart rate noted  Respiratory: Normal respiratory effort, no distress  Abdomen: Soft, gravid, nontender  Pelvic: Cervical exam deferred         Extremities: Edema: None  Fetal Status: Fetal Heart Rate (bpm): 144 Fundal Height: 32 cm Movement: Present    Results for orders placed or performed in visit on 04/14/18 (from the past 24 hour(s))  POCT urinalysis dipstick   Collection Time: 04/14/18  4:26 PM  Result Value Ref Range   Color, UA     Clarity, UA     Glucose, UA neg    Bilirubin, UA     Ketones, UA neg    Spec Grav, UA  1.010 - 1.025   Blood, UA neg    pH, UA  5.0 - 8.0   Protein, UA neg    Urobilinogen, UA  0.2 or 1.0 E.U./dL   Nitrite, UA neg    Leukocytes, UA Small (1+) (A) Negative   Appearance     Odor      Assessment & Plan:  1) Low-risk pregnancy [name] at 3364w0d with an Estimated Date of Delivery: 06/09/18    Meds: No orders of the defined types were placed in this encounter.  Labs/procedures today: none  Plan:  Continue routine obstetrical care   Reviewed: Preterm labor symptoms and general obstetric precautions including but not limited to vaginal bleeding, contractions, leaking of fluid and fetal movement were reviewed in detail with the patient.  All questions were answered  Follow-up: Return in about 2 weeks (around 04/28/2018) for LROB.  Orders Placed This Encounter  Procedures  . POCT urinalysis dipstick   Cheral MarkerKimberly R Booker CNM, [facility] 04/14/2018 4:56 PM

## 2018-04-20 ENCOUNTER — Telehealth: Payer: Self-pay | Admitting: *Deleted

## 2018-04-21 NOTE — Telephone Encounter (Signed)
Patient states Lindsey Jensen was to sent order to be signed off for breast pump.  Informed patient I had not received anything yet but will keep a lookout for it.  Pt verbalized understanding and will f/u with [place] later.

## 2018-04-25 ENCOUNTER — Ambulatory Visit (INDEPENDENT_AMBULATORY_CARE_PROVIDER_SITE_OTHER): Payer: BLUE CROSS/BLUE SHIELD | Admitting: Women's Health

## 2018-04-25 ENCOUNTER — Encounter: Payer: Self-pay | Admitting: Women's Health

## 2018-04-25 VITALS — BP 100/60 | HR 97 | Wt 199.8 lb

## 2018-04-25 DIAGNOSIS — Z1389 Encounter for screening for other disorder: Secondary | ICD-10-CM

## 2018-04-25 DIAGNOSIS — Z3403 Encounter for supervision of normal first pregnancy, third trimester: Secondary | ICD-10-CM

## 2018-04-25 DIAGNOSIS — Z331 Pregnant state, incidental: Secondary | ICD-10-CM

## 2018-04-25 DIAGNOSIS — Z3A33 33 weeks gestation of pregnancy: Secondary | ICD-10-CM

## 2018-04-25 LAB — POCT URINALYSIS DIPSTICK
Blood, UA: NEGATIVE
Glucose, UA: NEGATIVE
KETONES UA: NEGATIVE
Nitrite, UA: NEGATIVE
PROTEIN UA: NEGATIVE

## 2018-04-25 NOTE — Progress Notes (Signed)
   LOW-RISK PREGNANCY VISIT Patient name: Lindsey Jensen MRN [id number]  Date of birth: 1986-01-15 Chief Complaint:   low risk pregnancy  History of Present Illness:   Lindsey Jensen is a 32 y.o. [name] female at [redacted]w[redacted]d with an Estimated Date of Delivery: 06/09/18 being seen today for ongoing management of a low-risk pregnancy.  Today she reports no complaints. Contractions: Not present.  .  Movement: Present. denies leaking of fluid. Review of Systems:   Pertinent items are noted in HPI Denies abnormal vaginal discharge w/ itching/odor/irritation, headaches, visual changes, shortness of breath, chest pain, abdominal pain, severe nausea/vomiting, or problems with urination or bowel movements unless otherwise stated above. Pertinent History Reviewed:  Reviewed past medical,surgical, social, obstetrical and family history.  Reviewed problem list, medications and allergies. Physical Assessment:   Vitals:   04/25/18 1601  BP: 100/60  Pulse: 97  Weight: 199 lb 12.8 oz (90.6 kg)  Body mass index is 30.38 kg/m.        Physical Examination:   General appearance: Well appearing, and in no distress  Mental status: Alert, oriented to person, place, and time  Skin: Warm & dry  Cardiovascular: Normal heart rate noted  Respiratory: Normal respiratory effort, no distress  Abdomen: Soft, gravid, nontender  Pelvic: Cervical exam deferred         Extremities: Edema: None  Fetal Status: Fetal Heart Rate (bpm): 133 Fundal Height: 33 cm Movement: Present    Results for orders placed or performed in visit on 04/25/18 (from the past 24 hour(s))  POCT urinalysis dipstick   Collection Time: 04/25/18  4:09 PM  Result Value Ref Range   Color, UA     Clarity, UA     Glucose, UA neg    Bilirubin, UA     Ketones, UA neg    Spec Grav, UA  1.010 - 1.025   Blood, UA neg    pH, UA  5.0 - 8.0   Protein, UA neg    Urobilinogen, UA  0.2 or 1.0 E.U./dL   Nitrite, UA neg    Leukocytes, UA Small (1+) (A) Negative     Appearance     Odor      Assessment & Plan:  1) Low-risk pregnancy [name] at [redacted]w[redacted]d with an Estimated Date of Delivery: 06/09/18    Meds: No orders of the defined types were placed in this encounter.  Labs/procedures today: none  Plan:  Continue routine obstetrical care   Reviewed: Preterm labor symptoms and general obstetric precautions including but not limited to vaginal bleeding, contractions, leaking of fluid and fetal movement were reviewed in detail with the patient.  All questions were answered  Follow-up: Return in about 2 weeks (around 05/09/2018) for LROB.  Orders Placed This Encounter  Procedures  . POCT urinalysis dipstick   Cheral Marker CNM, [facility] 04/25/2018 4:44 PM

## 2018-04-25 NOTE — Patient Instructions (Signed)
[name], I greatly value your feedback.  If you receive a survey following your visit with [place] today, we appreciate you taking the time to fill it out.  Thanks, [name], CNM, WHNP-BC   Call the office [phone]) or go to [facility] if:  You begin to have strong, frequent contractions  Your water breaks.  Sometimes it is a big gush of fluid, sometimes it is just a trickle that keeps getting your panties wet or running down your legs  You have vaginal bleeding.  It is normal to have a small amount of spotting if your cervix was checked.   You don't feel your baby moving like normal.  If you don't, get you something to eat and drink and lay down and focus on feeling your baby move.  You should feel at least 10 movements in 2 hours.  If you don't, you should call the office or go to [facility].    Tdap Vaccine  It is recommended that you get the Tdap vaccine during the third trimester of EACH pregnancy to help protect your baby from getting pertussis (whooping cough)  27-36 weeks is the BEST time to do this so that you can pass the protection on to your baby. During pregnancy is better than after pregnancy, but if you are unable to get it during pregnancy it will be offered at the hospital.   You can get this vaccine at the health department or your family doctor  Everyone who will be around your baby should also be up-to-date on their vaccines. Adults (who are not pregnant) only need 1 dose of Tdap during adulthood.   Third Trimester of Pregnancy The third trimester is from week 29 through week 42, months 7 through 9. The third trimester is a time when the fetus is growing rapidly. At the end of the ninth month, the fetus is about 20 inches in length and weighs 6-10 pounds.  BODY CHANGES Your body goes through many changes during pregnancy. The changes vary from woman to woman.   Your weight will continue to increase. You can expect to gain 25-35 pounds (11-16 kg) by the  end of the pregnancy.  You may begin to get stretch marks on your hips, abdomen, and breasts.  You may urinate more often because the fetus is moving lower into your pelvis and pressing on your bladder.  You may develop or continue to have heartburn as a result of your pregnancy.  You may develop constipation because certain hormones are causing the muscles that push waste through your intestines to slow down.  You may develop hemorrhoids or swollen, bulging veins (varicose veins).  You may have pelvic pain because of the weight gain and pregnancy hormones relaxing your joints between the bones in your pelvis. Backaches may result from overexertion of the muscles supporting your posture.  You may have changes in your hair. These can include thickening of your hair, rapid growth, and changes in texture. Some women also have hair loss during or after pregnancy, or hair that feels dry or thin. Your hair will most likely return to normal after your baby is born.  Your breasts will continue to grow and be tender. A yellow discharge may leak from your breasts called colostrum.  Your belly button may stick out.  You may feel short of breath because of your expanding uterus.  You may notice the fetus "dropping," or moving lower in your abdomen.  You may have a bloody  mucus discharge. This usually occurs a few days to a week before labor begins.  Your cervix becomes thin and soft (effaced) near your due date. WHAT TO EXPECT AT YOUR PRENATAL EXAMS  You will have prenatal exams every 2 weeks until week 36. Then, you will have weekly prenatal exams. During a routine prenatal visit:  You will be weighed to make sure you and the fetus are growing normally.  Your blood pressure is taken.  Your abdomen will be measured to track your baby's growth.  The fetal heartbeat will be listened to.  Any test results from the previous visit will be discussed.  You may have a cervical check near your due  date to see if you have effaced. At around 36 weeks, your caregiver will check your cervix. At the same time, your caregiver will also perform a test on the secretions of the vaginal tissue. This test is to determine if a type of bacteria, Group B streptococcus, is present. Your caregiver will explain this further. Your caregiver may ask you:  What your birth plan is.  How you are feeling.  If you are feeling the baby move.  If you have had any abnormal symptoms, such as leaking fluid, bleeding, severe headaches, or abdominal cramping.  If you have any questions. Other tests or screenings that may be performed during your third trimester include:  Blood tests that check for low iron levels (anemia).  Fetal testing to check the health, activity level, and growth of the fetus. Testing is done if you have certain medical conditions or if there are problems during the pregnancy. FALSE LABOR You may feel small, irregular contractions that eventually go away. These are called [name], or false labor. Contractions may last for hours, days, or even weeks before true labor sets in. If contractions come at regular intervals, intensify, or become painful, it is best to be seen by your caregiver.  SIGNS OF LABOR   Menstrual-like cramps.  Contractions that are 5 minutes apart or less.  Contractions that start on the top of the uterus and spread down to the lower abdomen and back.  A sense of increased pelvic pressure or back pain.  A watery or bloody mucus discharge that comes from the vagina. If you have any of these signs before the 37th week of pregnancy, call your caregiver right away. You need to go to the hospital to get checked immediately. HOME CARE INSTRUCTIONS   Avoid all smoking, herbs, alcohol, and unprescribed drugs. These chemicals affect the formation and growth of the baby.  Follow your caregiver's instructions regarding medicine use. There are medicines that  are either safe or unsafe to take during pregnancy.  Exercise only as directed by your caregiver. Experiencing uterine cramps is a good sign to stop exercising.  Continue to eat regular, healthy meals.  Wear a good support bra for breast tenderness.  Do not use hot tubs, steam rooms, or saunas.  Wear your seat belt at all times when driving.  Avoid raw meat, uncooked cheese, cat litter boxes, and soil used by cats. These carry germs that can cause birth defects in the baby.  Take your prenatal vitamins.  Try taking a stool softener (if your caregiver approves) if you develop constipation. Eat more high-fiber foods, such as fresh vegetables or fruit and whole grains. Drink plenty of fluids to keep your urine clear or pale yellow.  Take warm sitz baths to soothe any pain or discomfort caused by hemorrhoids.  Use hemorrhoid cream if your caregiver approves.  If you develop varicose veins, wear support hose. Elevate your feet for 15 minutes, 3-4 times a day. Limit salt in your diet.  Avoid heavy lifting, wear low heal shoes, and practice good posture.  Rest a lot with your legs elevated if you have leg cramps or low back pain.  Visit your dentist if you have not gone during your pregnancy. Use a soft toothbrush to brush your teeth and be gentle when you floss.  A sexual relationship may be continued unless your caregiver directs you otherwise.  Do not travel far distances unless it is absolutely necessary and only with the approval of your caregiver.  Take prenatal classes to understand, practice, and ask questions about the labor and delivery.  Make a trial run to the hospital.  Pack your hospital bag.  Prepare the baby's nursery.  Continue to go to all your prenatal visits as directed by your caregiver. SEEK MEDICAL CARE IF:  You are unsure if you are in labor or if your water has broken.  You have dizziness.  You have mild pelvic cramps, pelvic pressure, or nagging pain  in your abdominal area.  You have persistent nausea, vomiting, or diarrhea.  You have a bad smelling vaginal discharge.  You have pain with urination. SEEK IMMEDIATE MEDICAL CARE IF:   You have a fever.  You are leaking fluid from your vagina.  You have spotting or bleeding from your vagina.  You have severe abdominal cramping or pain.  You have rapid weight loss or gain.  You have shortness of breath with chest pain.  You notice sudden or extreme swelling of your face, hands, ankles, feet, or legs.  You have not felt your baby move in over an hour.  You have severe headaches that do not go away with medicine.  You have vision changes. Document Released: 12/01/2001 Document Revised: 12/12/2013 Document Reviewed: 02/07/2013 [facility] Patient Information 2015 [place], [place]. This information is not intended to replace advice given to you by your health care provider. Make sure you discuss any questions you have with your health care provider.

## 2018-05-03 ENCOUNTER — Telehealth: Payer: Self-pay | Admitting: Women's Health

## 2018-05-03 NOTE — Telephone Encounter (Signed)
Pt called and wanted to talk about some concerns she was having. Pt states she is feeling dizzy and having chest pains/ I advised her that if she was having chest pains she needed to go to the er. She said no she didn't need go to er they were not heart attack chest pains, She wants nurse or kim to call her

## 2018-05-03 NOTE — Telephone Encounter (Signed)
LMOVM returning patient's call.  

## 2018-05-12 ENCOUNTER — Ambulatory Visit (INDEPENDENT_AMBULATORY_CARE_PROVIDER_SITE_OTHER): Payer: BLUE CROSS/BLUE SHIELD | Admitting: Advanced Practice Midwife

## 2018-05-12 ENCOUNTER — Encounter: Payer: Self-pay | Admitting: Advanced Practice Midwife

## 2018-05-12 VITALS — BP 100/60 | HR 98 | Wt 204.0 lb

## 2018-05-12 DIAGNOSIS — Z331 Pregnant state, incidental: Secondary | ICD-10-CM

## 2018-05-12 DIAGNOSIS — Z3403 Encounter for supervision of normal first pregnancy, third trimester: Secondary | ICD-10-CM

## 2018-05-12 DIAGNOSIS — Z3A36 36 weeks gestation of pregnancy: Secondary | ICD-10-CM | POA: Diagnosis not present

## 2018-05-12 DIAGNOSIS — Z1389 Encounter for screening for other disorder: Secondary | ICD-10-CM

## 2018-05-12 DIAGNOSIS — Z23 Encounter for immunization: Secondary | ICD-10-CM | POA: Diagnosis not present

## 2018-05-12 LAB — POCT URINALYSIS DIPSTICK
Glucose, UA: NEGATIVE
Ketones, UA: NEGATIVE
LEUKOCYTES UA: NEGATIVE
NITRITE UA: NEGATIVE
PROTEIN UA: NEGATIVE
RBC UA: NEGATIVE

## 2018-05-12 NOTE — Progress Notes (Signed)
  [name] [redacted]w[redacted]d Estimated Date of Delivery: 06/09/18  Blood pressure 100/60, pulse 98, weight 204 lb (92.5 kg), last menstrual period 09/02/2017.   BP weight and urine results all reviewed and noted.  Please refer to the obstetrical flow sheet for the fundal height and fetal heart rate documentation:  Patient reports good fetal movement, denies any bleeding and no rupture of membranes symptoms or regular contractions. Patient is without complaints. All questions were answered.   Physical Assessment:   Vitals:   05/12/18 1608  BP: 100/60  Pulse: 98  Weight: 204 lb (92.5 kg)  Body mass index is 31.02 kg/m.        Physical Examination:   General appearance: Well appearing, and in no distress  Mental status: Alert, oriented to person, place, and time  Skin: Warm & dry  Cardiovascular: Normal heart rate noted  Respiratory: Normal respiratory effort, no distress  Abdomen: Soft, gravid, nontender  Pelvic:          Extremities: Edema: None  Fetal Status: Fetal Heart Rate (bpm): 130 Fundal Height: 36 cm Movement: Present    Results for orders placed or performed in visit on 05/12/18 (from the past 24 hour(s))  POCT urinalysis dipstick   Collection Time: 05/12/18  4:15 PM  Result Value Ref Range   Color, UA     Clarity, UA     Glucose, UA Negative Negative   Bilirubin, UA     Ketones, UA neg    Spec Grav, UA  1.010 - 1.025   Blood, UA neg    pH, UA  5.0 - 8.0   Protein, UA Negative Negative   Urobilinogen, UA  0.2 or 1.0 E.U./dL   Nitrite, UA neg    Leukocytes, UA Negative Negative   Appearance     Odor       Orders Placed This Encounter  Procedures  . Tdap vaccine greater than or equal to 7yo IM  . POCT urinalysis dipstick    Plan:  Continued routine obstetrical care, tdap today.   Return in about 1 week (around 05/19/2018) for LROB.

## 2018-05-12 NOTE — Patient Instructions (Signed)

## 2018-05-19 ENCOUNTER — Ambulatory Visit (INDEPENDENT_AMBULATORY_CARE_PROVIDER_SITE_OTHER): Payer: BLUE CROSS/BLUE SHIELD | Admitting: Advanced Practice Midwife

## 2018-05-19 ENCOUNTER — Encounter: Payer: Self-pay | Admitting: Advanced Practice Midwife

## 2018-05-19 VITALS — BP 108/68 | HR 65 | Wt 205.5 lb

## 2018-05-19 DIAGNOSIS — Z3A37 37 weeks gestation of pregnancy: Secondary | ICD-10-CM

## 2018-05-19 DIAGNOSIS — Z3403 Encounter for supervision of normal first pregnancy, third trimester: Secondary | ICD-10-CM

## 2018-05-19 DIAGNOSIS — Z331 Pregnant state, incidental: Secondary | ICD-10-CM

## 2018-05-19 DIAGNOSIS — Z1389 Encounter for screening for other disorder: Secondary | ICD-10-CM

## 2018-05-19 LAB — POCT URINALYSIS DIPSTICK
Glucose, UA: NEGATIVE
KETONES UA: NEGATIVE
NITRITE UA: NEGATIVE
PROTEIN UA: NEGATIVE

## 2018-05-19 NOTE — Progress Notes (Signed)
LOW-RISK PREGNANCY VISIT Patient name: Lindsey Jensen MRN [id number]  Date of birth: 05-22-1986 Chief Complaint:   Routine Prenatal Visit (GBS, GC/CHL)  History of Present Illness:   Lindsey Jensen is a 32 y.o. [name] female at [redacted]w[redacted]d with an Estimated Date of Delivery: 06/09/18 being seen today for ongoing management of a low-risk pregnancy.  Today she reports no complaints. Contractions: Irregular. Vag. Bleeding: None.  Movement: Present. denies leaking of fluid. Review of Systems:   Pertinent items are noted in HPI Denies abnormal vaginal discharge w/ itching/odor/irritation, headaches, visual changes, shortness of breath, chest pain, abdominal pain, severe nausea/vomiting, or problems with urination or bowel movements unless otherwise stated above.  Pertinent History Reviewed:  Medical & Surgical Hx:   Past Medical History:  Diagnosis Date  . Contraceptive education 01/26/2014   Past Surgical History:  Procedure Laterality Date  . NO PAST SURGERIES     Family History  Problem Relation Age of Onset  . Heart disease Father        heart attack  . Diabetes Mother   . Depression Mother   . Anxiety disorder Mother   . Heart disease Mother   . Heart disease Maternal Grandmother        CHF  . Alzheimer's disease Maternal Grandfather     Current Outpatient Medications:  .  clindamycin (CLINDAGEL) 1 % gel, Apply topically 2 (two) times daily. (Patient taking differently: Apply topically as needed. ), Disp: 30 g, Rfl: 11 .  Prenat-FeAsp-Meth-FA-DHA w/o A (PRENATE PIXIE) 10-0.6-0.4-200 MG CAPS, Take 1 capsule by mouth daily., Disp: 30 capsule, Rfl: 11 Social History: Reviewed -  reports that she quit smoking about 3 years ago. Her smoking use included cigarettes. She smoked 0.75 packs per day. She has never used smokeless tobacco.  Physical Assessment:   Vitals:   05/19/18 1604  BP: 108/68  Pulse: 65  Weight: 205 lb 8 oz (93.2 kg)  Body mass index is 31.25 kg/m.        Physical  Examination:   General appearance: Well appearing, and in no distress  Mental status: Alert, oriented to person, place, and time  Skin: Warm & dry  Cardiovascular: Normal heart rate noted  Respiratory: Normal respiratory effort, no distress  Abdomen: Soft, gravid, nontender  Pelvic: Cervical exam performed         Extremities: Edema: Trace  Fetal Status: Fetal Heart Rate (bpm): 132 Fundal Height: 37 cm Movement: Present    Results for orders placed or performed in visit on 05/19/18 (from the past 24 hour(s))  POCT urinalysis dipstick   Collection Time: 05/19/18  4:06 PM  Result Value Ref Range   Color, UA     Clarity, UA     Glucose, UA Negative Negative   Bilirubin, UA     Ketones, UA neg    Spec Grav, UA  1.010 - 1.025   Blood, UA trace    pH, UA  5.0 - 8.0   Protein, UA Negative Negative   Urobilinogen, UA  0.2 or 1.0 E.U./dL   Nitrite, UA neg    Leukocytes, UA Large (3+) (A) Negative   Appearance     Odor      Assessment & Plan:  1) Low-risk pregnancy [name] at [redacted]w[redacted]d with an Estimated Date of Delivery: 06/09/18      Labs/procedures/US today: GBS/GC/CHL  Plan:  Continue routine obstetrical care  Cervical ripening info given  Follow-up: Return in about 1 week (around 05/26/2018) for LROB.  Orders Placed This Encounter  Procedures  . GC/Chlamydia Probe Amp  . Strep Gp B NAA  . POCT urinalysis dipstick   Jacklyn Shell CNM 05/19/2018 7:34 PM

## 2018-05-19 NOTE — Patient Instructions (Addendum)
AM I IN LABOR? What is labor? Labor is the work that your body does to birth your baby. Your uterus (the womb) contracts. Your cervix (the mouth of the uterus) opens. You will push your baby out into the world.  What do contractions (labor pains) feel like? When they first start, contractions usually feel like cramps during your period. Sometimes you feel pain in your back. Most often, contractions feel like muscles pulling painfully in your lower belly. At first, the contractions will probably be 15 to 20 minutes apart. They will not feel too painful. As labor goes on, the contractions get stronger, closer together, and more painful.  How do I time the contractions? Time your contractions by counting the number of minutes from the start of one contraction to the start of the next contraction.  What should I do when the contractions start? If it is night and you can sleep, sleep. If it happens during the day, here are some things you can do to take care of yourself at home: ? Walk. If the pains you are having are real labor, walking will make the contractions come faster and harder. If the contractions are not going to continue and be real labor, walking will make the contractions slow down. ? Take a shower or bath. This will help you relax. ? Eat. Labor is a big event. It takes a lot of energy. ? Drink water. Not drinking enough water can cause false labor (contractions that hurt but do not open your cervix). If this is true labor, drinking water will help you have strength to get through your labor. ? Take a nap. Get all the rest you can. ? Get a massage. If your labor is in your back, a strong massage on your lower back may feel very good. Getting a foot massage is always good. ? Don't panic. You can do this. Your body was made for this. You are strong!  When should I go to the hospital or call my health care provider? ? Your contractions have been 5 minutes apart or less for at least 1  hour. ? If several contractions are so painful you cannot walk or talk during one. ? Your bag of waters breaks. (You may have a big gush of water or just water that runs down your legs when you walk.)  Are there other reasons to call my health care provider? Yes, you should call your health care provider or go to the hospital if you start to bleed like you are having a period- blood that soaks your underwear or runs down your legs, if you have sudden severe pain, if your baby has not moved for several hours, or if you are leaking green fluid. The rule is as follows: If you are very concerned about something, call      Cervical Ripening: May try one or both  Red Raspberry Leaf capsules: two 300mg or 400mg tablets with each meal, 2-3 times a day  Potential Side Effects Of Raspberry Leaf:  Most women do not experience any side effects from drinking raspberry leaf tea. However, nausea and loose stools are possible   Evening Primrose Oil capsules: 3 500 mg capsules daily.  Some of the potential side effects:  Upset stomach  Loose stools or diarrhea  Headaches  Nausea:    .  

## 2018-05-21 LAB — STREP GP B NAA: STREP GROUP B AG: NEGATIVE

## 2018-05-21 LAB — GC/CHLAMYDIA PROBE AMP
CHLAMYDIA, DNA PROBE: NEGATIVE
NEISSERIA GONORRHOEAE BY PCR: NEGATIVE

## 2018-05-26 ENCOUNTER — Ambulatory Visit (INDEPENDENT_AMBULATORY_CARE_PROVIDER_SITE_OTHER): Payer: BLUE CROSS/BLUE SHIELD | Admitting: Obstetrics & Gynecology

## 2018-05-26 ENCOUNTER — Encounter: Payer: Self-pay | Admitting: Obstetrics & Gynecology

## 2018-05-26 VITALS — BP 115/73 | HR 60 | Wt 207.0 lb

## 2018-05-26 DIAGNOSIS — Z1389 Encounter for screening for other disorder: Secondary | ICD-10-CM

## 2018-05-26 DIAGNOSIS — Z331 Pregnant state, incidental: Secondary | ICD-10-CM

## 2018-05-26 DIAGNOSIS — Z3403 Encounter for supervision of normal first pregnancy, third trimester: Secondary | ICD-10-CM

## 2018-05-26 DIAGNOSIS — Z3A38 38 weeks gestation of pregnancy: Secondary | ICD-10-CM

## 2018-05-26 LAB — POCT URINALYSIS DIPSTICK
GLUCOSE UA: NEGATIVE
Ketones, UA: NEGATIVE
Leukocytes, UA: NEGATIVE
Nitrite, UA: NEGATIVE
Protein, UA: NEGATIVE
RBC UA: NEGATIVE

## 2018-05-26 NOTE — Progress Notes (Signed)
[name] 676w0d Estimated Date of Delivery: 06/09/18  Blood pressure 115/73, pulse 60, weight 207 lb (93.9 kg), last menstrual period 09/02/2017.   BP weight and urine results all reviewed and noted.  Please refer to the obstetrical flow sheet for the fundal height and fetal heart rate documentation:  Patient reports good fetal movement, denies any bleeding and no rupture of membranes symptoms or regular contractions. Patient is without complaints. All questions were answered.  Orders Placed This Encounter  Procedures  . POCT Urinalysis Dipstick    Plan:  Continued routine obstetrical care,   Return in about 1 week (around 06/02/2018) for LROB.

## 2018-06-02 ENCOUNTER — Encounter: Payer: Self-pay | Admitting: Women's Health

## 2018-06-02 ENCOUNTER — Ambulatory Visit (INDEPENDENT_AMBULATORY_CARE_PROVIDER_SITE_OTHER): Payer: BLUE CROSS/BLUE SHIELD | Admitting: Women's Health

## 2018-06-02 VITALS — BP 123/70 | HR 70 | Wt 210.2 lb

## 2018-06-02 DIAGNOSIS — Z331 Pregnant state, incidental: Secondary | ICD-10-CM

## 2018-06-02 DIAGNOSIS — Z1389 Encounter for screening for other disorder: Secondary | ICD-10-CM

## 2018-06-02 DIAGNOSIS — Z3A39 39 weeks gestation of pregnancy: Secondary | ICD-10-CM

## 2018-06-02 DIAGNOSIS — Z3403 Encounter for supervision of normal first pregnancy, third trimester: Secondary | ICD-10-CM

## 2018-06-02 LAB — POCT URINALYSIS DIPSTICK
GLUCOSE UA: NEGATIVE
KETONES UA: NEGATIVE
Nitrite, UA: NEGATIVE
Protein, UA: NEGATIVE
RBC UA: NEGATIVE

## 2018-06-02 NOTE — Progress Notes (Signed)
   LOW-RISK PREGNANCY VISIT Patient name: Lindsey Jensen MRN [id number]  Date of birth: [birth year] Chief Complaint:   Routine Prenatal Visit  History of Present Illness:   Lindsey Jensen is a 32 y.o. [name] female at 384w0d with an Estimated Date of Delivery: 06/09/18 being seen today for ongoing management of a low-risk pregnancy.  Today she reports no complaints. Contractions: Irregular. Vag. Bleeding: None.  Movement: Present. denies leaking of fluid. Review of Systems:   Pertinent items are noted in HPI Denies abnormal vaginal discharge w/ itching/odor/irritation, headaches, visual changes, shortness of breath, chest pain, abdominal pain, severe nausea/vomiting, or problems with urination or bowel movements unless otherwise stated above. Pertinent History Reviewed:  Reviewed past medical,surgical, social, obstetrical and family history.  Reviewed problem list, medications and allergies. Physical Assessment:   Vitals:   06/02/18 1611  BP: 123/70  Pulse: 70  Weight: 210 lb 3.2 oz (95.3 kg)  Body mass index is 31.96 kg/m.        Physical Examination:   General appearance: Well appearing, and in no distress  Mental status: Alert, oriented to person, place, and time  Skin: Warm & dry  Cardiovascular: Normal heart rate noted  Respiratory: Normal respiratory effort, no distress  Abdomen: Soft, gravid, nontender  Pelvic: Cervical exam performed  Dilation: Closed Effacement (%): Thick Station: -2outer os 1cm/inner os closed, thick, -2, vtx  Extremities: Edema: Trace  Fetal Status: Fetal Heart Rate (bpm): 137 Fundal Height: 37 cm Movement: Present Presentation: Vertex  Results for orders placed or performed in visit on 06/02/18 (from the past 24 hour(s))  POCT urinalysis dipstick   Collection Time: 06/02/18  4:13 PM  Result Value Ref Range   Color, UA     Clarity, UA     Glucose, UA Negative Negative   Bilirubin, UA     Ketones, UA neg    Spec Grav, UA  1.010 - 1.025   Blood, UA neg      pH, UA  5.0 - 8.0   Protein, UA Negative Negative   Urobilinogen, UA  0.2 or 1.0 E.U./dL   Nitrite, UA neg    Leukocytes, UA Small (1+) (A) Negative   Appearance     Odor      Assessment & Plan:  1) Low-risk pregnancy [name] at 824w0d with an Estimated Date of Delivery: 06/09/18    Meds: No orders of the defined types were placed in this encounter.  Labs/procedures today: sve  Plan:  Continue routine obstetrical care   Reviewed: Term labor symptoms and general obstetric precautions including but not limited to vaginal bleeding, contractions, leaking of fluid and fetal movement were reviewed in detail with the patient.  All questions were answered  Follow-up: Return in about 1 week (around 06/09/2018) for LROB.  Orders Placed This Encounter  Procedures  . POCT urinalysis dipstick   Cheral MarkerKimberly R Syble Picco CNM, [facility]NP-BC 06/02/2018 4:35 PM

## 2018-06-02 NOTE — Patient Instructions (Signed)
Colon [name], I greatly value your feedback.  If you receive a survey following your visit with us today, we appreciate you taking the time to fill it out.  Thanks, Joellyn HaffKim Stepfon Rawles, CNM, WHNP-BC   Call the office [phone]) or go to [facility] if:  You begin to have strong, frequent contractions  Your water breaks.  Sometimes it is a big gush of fluid, sometimes it is just a trickle that keeps getting your panties wet or running down your legs  You have vaginal bleeding.  It is normal to have a small amount of spotting if your cervix was checked.   You don't feel your baby moving like normal.  If you don't, get you something to eat and drink and lay down and focus on feeling your baby move.  You should feel at least 10 movements in 2 hours.  If you don't, you should call the office or go to [facility].     [facility] Contractions of the uterus can occur throughout pregnancy, but they are not always a sign that you are in labor. You may have practice contractions called [name]. These false labor contractions are sometimes confused with true labor. What are Lindsey PeltonBraxton Hicks contractions? [name] are tightening movements that occur in the muscles of the uterus before labor. Unlike true labor contractions, these contractions do not result in opening (dilation) and thinning of the cervix. Toward the end of pregnancy (32-34 weeks), [name] can happen more often and may become stronger. These contractions are sometimes difficult to tell apart from true labor because they can be very uncomfortable. You should not feel embarrassed if you go to the hospital with false labor. Sometimes, the only way to tell if you are in true labor is for your health care provider to look for changes in the cervix. The health care provider will do a physical exam and may monitor your contractions. If you are not in true labor, the exam should show  that your cervix is not dilating and your water has not broken. If there are other health problems associated with your pregnancy, it is completely safe for you to be sent home with false labor. You may continue to have [name] until you go into true labor. How to tell the difference between true labor and false labor True labor  Contractions last 30-70 seconds.  Contractions become very regular.  Discomfort is usually felt in the top of the uterus, and it spreads to the lower abdomen and low back.  Contractions do not go away with walking.  Contractions usually become more intense and increase in frequency.  The cervix dilates and gets thinner. False labor  Contractions are usually shorter and not as strong as true labor contractions.  Contractions are usually irregular.  Contractions are often felt in the front of the lower abdomen and in the groin.  Contractions may go away when you walk around or change positions while lying down.  Contractions get weaker and are shorter-lasting as time goes on.  The cervix usually does not dilate or become thin. Follow these instructions at home:  Take over-the-counter and prescription medicines only as told by your health care provider.  Keep up with your usual exercises and follow other instructions from your health care provider.  Eat and drink lightly if you think you are going into labor.  If [name] are making you uncomfortable: ? Change your position from lying  down or resting to walking, or change from walking to resting. ? Sit and rest in a tub of warm water. ? Drink enough fluid to keep your urine pale yellow. Dehydration may cause these contractions. ? Do slow and deep breathing several times an hour.  Keep all follow-up prenatal visits as told by your health care provider. This is important. Contact a health care provider if:  You have a fever.  You have continuous pain in your  abdomen. Get help right away if:  Your contractions become stronger, more regular, and closer together.  You have fluid leaking or gushing from your vagina.  You pass blood-tinged mucus (bloody show).  You have bleeding from your vagina.  You have low back pain that you never had before.  You feel your baby's head pushing down and causing pelvic pressure.  Your baby is not moving inside you as much as it used to. Summary  Contractions that occur before labor are called [name], false labor, or practice contractions.  [name] are usually shorter, weaker, farther apart, and less regular than true labor contractions. True labor contractions usually become progressively stronger and regular and they become more frequent.  Manage discomfort from [name] contractions by changing position, resting in a warm bath, drinking plenty of water, or practicing deep breathing. This information is not intended to replace advice given to you by your health care provider. Make sure you discuss any questions you have with your health care provider. Document Released: 04/22/2017 Document Revised: 04/22/2017 Document Reviewed: 04/22/2017 Elsevier Interactive Patient Education  2018 [name].

## 2018-06-09 ENCOUNTER — Ambulatory Visit (INDEPENDENT_AMBULATORY_CARE_PROVIDER_SITE_OTHER): Payer: BLUE CROSS/BLUE SHIELD | Admitting: Obstetrics and Gynecology

## 2018-06-09 ENCOUNTER — Telehealth (HOSPITAL_COMMUNITY): Payer: Self-pay | Admitting: *Deleted

## 2018-06-09 ENCOUNTER — Encounter: Payer: Self-pay | Admitting: Obstetrics and Gynecology

## 2018-06-09 VITALS — BP 111/64 | HR 72 | Wt 209.6 lb

## 2018-06-09 DIAGNOSIS — Z1389 Encounter for screening for other disorder: Secondary | ICD-10-CM

## 2018-06-09 DIAGNOSIS — Z3A40 40 weeks gestation of pregnancy: Secondary | ICD-10-CM

## 2018-06-09 DIAGNOSIS — Z331 Pregnant state, incidental: Secondary | ICD-10-CM

## 2018-06-09 DIAGNOSIS — Z3403 Encounter for supervision of normal first pregnancy, third trimester: Secondary | ICD-10-CM

## 2018-06-09 DIAGNOSIS — O480 Post-term pregnancy: Secondary | ICD-10-CM

## 2018-06-09 LAB — POCT URINALYSIS DIPSTICK
Glucose, UA: NEGATIVE
KETONES UA: NEGATIVE
NITRITE UA: NEGATIVE
PROTEIN UA: NEGATIVE
RBC UA: NEGATIVE

## 2018-06-09 NOTE — Progress Notes (Addendum)
Patient ID: Lindsey Jensen, female   DOB: [birth year], 32 y.o.   MRN: [id number]     LOW-RISK PREGNANCY VISIT Patient name: Lindsey Jensen MRN [id number]  Date of birth: 1986/03/13 Chief Complaint:   Routine Prenatal Visit  History of Present Illness:   Lindsey Jensen is a 32 y.o. [name] female at [redacted]w[redacted]d with an Estimated Date of Delivery: 06/09/18 being seen today for ongoing management of a low-risk pregnancy.  Today she reports no complaints. She is going to try to breastfeed this child. Cervix was checked last week and was 1cm dilated.   Contractions: Irregular. Vag. Bleeding: None.  Movement: Present. denies leaking of fluid. Review of Systems:   Pertinent items are noted in HPI Denies abnormal vaginal discharge w/ itching/odor/irritation, headaches, visual changes, shortness of breath, chest pain, abdominal pain, severe nausea/vomiting, or problems with urination or bowel movements unless otherwise stated above. Pertinent History Reviewed:  Reviewed past medical,surgical, social, obstetrical and family history.  Reviewed problem list, medications and allergies. Physical Assessment:   Vitals:   06/09/18 0959  BP: 111/64  Pulse: 72  Weight: 209 lb 9.6 oz (95.1 kg)  Body mass index is 31.87 kg/m.        Physical Examination:   General appearance: Well appearing, and in no distress  Mental status: Alert, oriented to person, place, and time  Skin: Warm & dry  Cardiovascular: Normal heart rate noted  Respiratory: Normal respiratory effort, no distress  Abdomen: Soft, gravid, nontender  Pelvic:    Cervix: " way to the back", very soft, can reach the outer edge.  Dilation: Fingertip    0.5 cm  Extremities: Edema: Trace  Fetal Status: Fetal Heart Rate (bpm): 137 Fundal Height: 39 cm Movement: Present    Results for orders placed or performed in visit on 06/09/18 (from the past 24 hour(s))  POCT urinalysis dipstick   Collection Time: 06/09/18 10:00 AM  Result Value Ref Range   Color, UA       Clarity, UA     Glucose, UA Negative Negative   Bilirubin, UA     Ketones, UA neg    Spec Grav, UA  1.010 - 1.025   Blood, UA neg    pH, UA  5.0 - 8.0   Protein, UA Negative Negative   Urobilinogen, UA  0.2 or 1.0 E.U./dL   Nitrite, UA neg    Leukocytes, UA Trace (A) Negative   Appearance     Odor      Assessment & Plan:  1) Low-risk pregnancy [name] at [redacted]w[redacted]d with an Estimated Date of Delivery: 06/09/18   2) cervix 1/2 cm dilated   Meds: No orders of the defined types were placed in this encounter.  Plan:  Continue routine obstetrical care  1. Will schedule induction around 41weeks IOL [place] 6/27 scheduled 2. NST by Monday, 06/13/2018  Reviewed: Term labor symptoms and general obstetric precautions including but not limited to vaginal bleeding, contractions, leaking of fluid and fetal movement were reviewed in detail with the patient.  All questions were answered  Follow-up: nst monday  Orders Placed This Encounter  Procedures  . POCT urinalysis dipstick    By signing my name below, I, Pietro Cassis, attest that this documentation has been prepared under the direction and in the presence of Tilda Burrow, MD Electronically Signed: Pietro Cassis, Medical Scribe. 06/09/18. 10:15 AM.  I personally performed the services described in this documentation, which was SCRIBED in my presence. The recorded information  has been reviewed and considered accurate. It has been edited as necessary during review. Tilda BurrowJohn V Zabdi Mis, MD

## 2018-06-09 NOTE — Telephone Encounter (Signed)
Preadmission screen  

## 2018-06-10 ENCOUNTER — Telehealth (HOSPITAL_COMMUNITY): Payer: Self-pay | Admitting: *Deleted

## 2018-06-10 ENCOUNTER — Encounter (HOSPITAL_COMMUNITY): Payer: Self-pay | Admitting: *Deleted

## 2018-06-10 NOTE — Telephone Encounter (Signed)
Preadmission screen  

## 2018-06-13 ENCOUNTER — Ambulatory Visit (INDEPENDENT_AMBULATORY_CARE_PROVIDER_SITE_OTHER): Payer: BLUE CROSS/BLUE SHIELD | Admitting: Women's Health

## 2018-06-13 ENCOUNTER — Encounter: Payer: Self-pay | Admitting: Women's Health

## 2018-06-13 VITALS — BP 120/71 | HR 80 | Wt 212.0 lb

## 2018-06-13 DIAGNOSIS — Z3403 Encounter for supervision of normal first pregnancy, third trimester: Secondary | ICD-10-CM

## 2018-06-13 DIAGNOSIS — Z1389 Encounter for screening for other disorder: Secondary | ICD-10-CM

## 2018-06-13 DIAGNOSIS — Z3A40 40 weeks gestation of pregnancy: Secondary | ICD-10-CM | POA: Diagnosis not present

## 2018-06-13 DIAGNOSIS — Z331 Pregnant state, incidental: Secondary | ICD-10-CM

## 2018-06-13 DIAGNOSIS — O480 Post-term pregnancy: Secondary | ICD-10-CM | POA: Diagnosis not present

## 2018-06-13 LAB — POCT URINALYSIS DIPSTICK
Blood, UA: NEGATIVE
GLUCOSE UA: NEGATIVE
KETONES UA: NEGATIVE
LEUKOCYTES UA: NEGATIVE
Nitrite, UA: NEGATIVE
Protein, UA: NEGATIVE

## 2018-06-13 NOTE — Patient Instructions (Signed)
Lindsey Jensen, I greatly value your feedback.  If you receive a survey following your visit with us today, we appreciate you taking the time to fill it out.  Thanks, Lindsey Jensen, CNM, WHNP-BC   Call the office [phone]) or go to [name] if:  You begin to have strong, frequent contractions  Your water breaks.  Sometimes it is a big gush of fluid, sometimes it is just a trickle that keeps getting your panties wet or running down your legs  You have vaginal bleeding.  It is normal to have a small amount of spotting if your cervix was checked.   You don't feel your baby moving like normal.  If you don't, get you something to eat and drink and lay down and focus on feeling your baby move.  You should feel at least 10 movements in 2 hours.  If you don't, you should call the office or go to [name].     [facility] Contractions of the uterus can occur throughout pregnancy, but they are not always a sign that you are in labor. You may have practice contractions called [name]. These false labor contractions are sometimes confused with true labor. What are Lindsey PeltonBraxton Hicks contractions? [name] are tightening movements that occur in the muscles of the uterus before labor. Unlike true labor contractions, these contractions do not result in opening (dilation) and thinning of the cervix. Toward the end of pregnancy (32-34 weeks), [name] can happen more often and may become stronger. These contractions are sometimes difficult to tell apart from true labor because they can be very uncomfortable. You should not feel embarrassed if you go to the hospital with false labor. Sometimes, the only way to tell if you are in true labor is for your health care provider to look for changes in the cervix. The health care provider will do a physical exam and may monitor your contractions. If you are not in true labor, the exam should show  that your cervix is not dilating and your water has not broken. If there are other health problems associated with your pregnancy, it is completely safe for you to be sent home with false labor. You may continue to have [name] until you go into true labor. How to tell the difference between true labor and false labor True labor  Contractions last 30-70 seconds.  Contractions become very regular.  Discomfort is usually felt in the top of the uterus, and it spreads to the lower abdomen and low back.  Contractions do not go away with walking.  Contractions usually become more intense and increase in frequency.  The cervix dilates and gets thinner. False labor  Contractions are usually shorter and not as strong as true labor contractions.  Contractions are usually irregular.  Contractions are often felt in the front of the lower abdomen and in the groin.  Contractions may go away when you walk around or change positions while lying down.  Contractions get weaker and are shorter-lasting as time goes on.  The cervix usually does not dilate or become thin. Follow these instructions at home:  Take over-the-counter and prescription medicines only as told by your health care provider.  Keep up with your usual exercises and follow other instructions from your health care provider.  Eat and drink lightly if you think you are going into labor.  If [name] are making you uncomfortable: ? Change your position from lying  down or resting to walking, or change from walking to resting. ? Sit and rest in a tub of warm water. ? Drink enough fluid to keep your urine pale yellow. Dehydration may cause these contractions. ? Do slow and deep breathing several times an hour.  Keep all follow-up prenatal visits as told by your health care provider. This is important. Contact a health care provider if:  You have a fever.  You have continuous pain in your  abdomen. Get help right away if:  Your contractions become stronger, more regular, and closer together.  You have fluid leaking or gushing from your vagina.  You pass blood-tinged mucus (bloody show).  You have bleeding from your vagina.  You have low back pain that you never had before.  You feel your baby's head pushing down and causing pelvic pressure.  Your baby is not moving inside you as much as it used to. Summary  Contractions that occur before labor are called [name], false labor, or practice contractions.  [name] are usually shorter, weaker, farther apart, and less regular than true labor contractions. True labor contractions usually become progressively stronger and regular and they become more frequent.  Manage discomfort from [name] contractions by changing position, resting in a warm bath, drinking plenty of water, or practicing deep breathing. This information is not intended to replace advice given to you by your health care provider. Make sure you discuss any questions you have with your health care provider. Document Released: 04/22/2017 Document Revised: 04/22/2017 Document Reviewed: 04/22/2017 Elsevier Interactive Patient Education  2018 [name].

## 2018-06-13 NOTE — Progress Notes (Signed)
LOW-RISK PREGNANCY VISIT Patient name: Lindsey Jensen MRN [id number]  Date of birth: Dec 28, 1985 Chief Complaint:   Routine Prenatal Visit  History of Present Illness:   Lindsey Jensen is a 32 y.o. [name] female at 6342w4d with an Estimated Date of Delivery: 06/09/18 being seen today for ongoing management of a low-risk pregnancy.  Today she reports no complaints. Has IOL scheduled for 6/27 @ [facility], interested in waiting a little bit longer before being induced, maybe next Monday- would be 41.4, to give her body more time to go into labor on its own. Contractions: Irregular. Vag. Bleeding: None.  Movement: Present. denies leaking of fluid. Review of Systems:   Pertinent items are noted in HPI Denies abnormal vaginal discharge w/ itching/odor/irritation, headaches, visual changes, shortness of breath, chest pain, abdominal pain, severe nausea/vomiting, or problems with urination or bowel movements unless otherwise stated above. Pertinent History Reviewed:  Reviewed past medical,surgical, social, obstetrical and family history.  Reviewed problem list, medications and allergies. Physical Assessment:   Vitals:   06/13/18 1420  BP: 120/71  Pulse: 80  Weight: 212 lb (96.2 kg)  Body mass index is 32.23 kg/m.        Physical Examination:   General appearance: Well appearing, and in no distress  Mental status: Alert, oriented to person, place, and time  Skin: Warm & dry  Cardiovascular: Normal heart rate noted  Respiratory: Normal respiratory effort, no distress  Abdomen: Soft, gravid, nontender  Pelvic: Cervical exam deferred         Extremities: Edema: Trace  Fetal Status: Fetal Heart Rate (bpm): 120 Fundal Height: 39 cm Movement: Present Presentation: Vertex  NST: FHR baseline 120 bpm, Variability: moderate, Accelerations:present, Decelerations:  Absent= Cat 1/Reactive Toco: none    Results for orders placed or performed in visit on 06/13/18 (from the past 24 hour(s))  POCT Urinalysis Dipstick    Collection Time: 06/13/18  2:24 PM  Result Value Ref Range   Color, UA     Clarity, UA     Glucose, UA Negative Negative   Bilirubin, UA     Ketones, UA neg    Spec Grav, UA  1.010 - 1.025   Blood, UA neg    pH, UA  5.0 - 8.0   Protein, UA Negative Negative   Urobilinogen, UA  0.2 or 1.0 E.U./dL   Nitrite, UA neg    Leukocytes, UA Negative Negative   Appearance     Odor      Assessment & Plan:  1) Low-risk pregnancy [name] at 6242w4d with an Estimated Date of Delivery: 06/09/18   2) Post dates, has IOL scheduled for 6/27 @ [facility]. Pt interested in possibly pushing to 7/1 @ 41.4 to give body more time. Discussed absolute rate of fetal death from 40-41wks: 1.08 per 1,000, from 41-42wks: 1.27 per 1,000. Discussed reasonable to wait until closer to 42wks if she would like. Pt ultimately decided to stick w/ current induction date.    Meds: No orders of the defined types were placed in this encounter.  Labs/procedures today: nst for postdates  Plan:  IOL as scheduled 6/27 @ [facility]  Reviewed: Term labor symptoms and general obstetric precautions including but not limited to vaginal bleeding, contractions, leaking of fluid and fetal movement were reviewed in detail with the patient.  All questions were answered  Follow-up: Return for 6wks for pp visit.  Orders Placed This Encounter  Procedures  . POCT Urinalysis Dipstick   Cheral MarkerKimberly R Carrick Rijos CNM, WHNP-BC  06/13/2018 4:50 PM

## 2018-06-15 ENCOUNTER — Other Ambulatory Visit: Payer: Self-pay

## 2018-06-15 ENCOUNTER — Inpatient Hospital Stay (HOSPITAL_COMMUNITY): Payer: BLUE CROSS/BLUE SHIELD | Admitting: Anesthesiology

## 2018-06-15 ENCOUNTER — Encounter (HOSPITAL_COMMUNITY): Payer: Self-pay | Admitting: *Deleted

## 2018-06-15 ENCOUNTER — Encounter (HOSPITAL_COMMUNITY): Payer: Self-pay

## 2018-06-15 ENCOUNTER — Inpatient Hospital Stay (HOSPITAL_COMMUNITY)
Admission: AD | Admit: 2018-06-15 | Discharge: 2018-06-18 | DRG: 807 | Disposition: A | Discharge status: Home / Self Care | Payer: BLUE CROSS/BLUE SHIELD | Attending: Obstetrics & Gynecology | Admitting: Obstetrics & Gynecology

## 2018-06-15 ENCOUNTER — Inpatient Hospital Stay (HOSPITAL_COMMUNITY)
Admission: AD | Admit: 2018-06-15 | Discharge: 2018-06-15 | Disposition: A | Discharge status: Home / Self Care | Payer: BLUE CROSS/BLUE SHIELD | Source: Ambulatory Visit | Attending: Family Medicine | Admitting: Family Medicine

## 2018-06-15 ENCOUNTER — Telehealth: Payer: Self-pay | Admitting: Obstetrics and Gynecology

## 2018-06-15 DIAGNOSIS — Z3403 Encounter for supervision of normal first pregnancy, third trimester: Secondary | ICD-10-CM

## 2018-06-15 DIAGNOSIS — Z3A40 40 weeks gestation of pregnancy: Secondary | ICD-10-CM

## 2018-06-15 DIAGNOSIS — Z3483 Encounter for supervision of other normal pregnancy, third trimester: Secondary | ICD-10-CM | POA: Diagnosis not present

## 2018-06-15 DIAGNOSIS — O479 False labor, unspecified: Secondary | ICD-10-CM

## 2018-06-15 DIAGNOSIS — Z87891 Personal history of nicotine dependence: Secondary | ICD-10-CM

## 2018-06-15 DIAGNOSIS — Z3A41 41 weeks gestation of pregnancy: Secondary | ICD-10-CM | POA: Diagnosis not present

## 2018-06-15 LAB — CBC
HCT: 38.2 % (ref 36.0–46.0)
Hemoglobin: 13.4 g/dL (ref 12.0–15.0)
MCH: 32.1 pg (ref 26.0–34.0)
MCHC: 35.1 g/dL (ref 30.0–36.0)
MCV: 91.6 fL (ref 78.0–100.0)
Platelets: 169 10*3/uL (ref 150–400)
RBC: 4.17 MIL/uL (ref 3.87–5.11)
RDW: 13.3 % (ref 11.5–15.5)
WBC: 14.3 10*3/uL — AB (ref 4.0–10.5)

## 2018-06-15 LAB — TYPE AND SCREEN
ABO/RH(D): O POS
ANTIBODY SCREEN: NEGATIVE

## 2018-06-15 LAB — ABO/RH: ABO/RH(D): O POS

## 2018-06-15 MED ORDER — OXYCODONE-ACETAMINOPHEN 5-325 MG PO TABS: 2.0000 | ORAL_TABLET | ORAL | Status: DC | PRN

## 2018-06-15 MED ORDER — LIDOCAINE HCL (PF) 1 % IJ SOLN
30.0000 mL | INTRAMUSCULAR | Status: AC | PRN
  Administered 2018-06-16: 30 mL via SUBCUTANEOUS
  Filled 2018-06-15: qty 30

## 2018-06-15 MED ORDER — EPHEDRINE 5 MG/ML INJ
10.0000 mg | INTRAVENOUS | Status: DC | PRN
  Filled 2018-06-15: qty 2

## 2018-06-15 MED ORDER — MISOPROSTOL 25 MCG QUARTER TABLET
25.0000 ug | ORAL_TABLET | ORAL | Status: DC | PRN
  Filled 2018-06-15: qty 1

## 2018-06-15 MED ORDER — OXYTOCIN 40 UNITS IN LACTATED RINGERS INFUSION - SIMPLE MED
2.5000 [IU]/h | INTRAVENOUS | Status: DC
  Filled 2018-06-15: qty 1000

## 2018-06-15 MED ORDER — LACTATED RINGERS IV SOLN: 500.0000 mL | INTRAVENOUS | Status: DC | PRN

## 2018-06-15 MED ORDER — DIPHENHYDRAMINE HCL 50 MG/ML IJ SOLN: 12.5000 mg | INTRAMUSCULAR | Status: DC | PRN

## 2018-06-15 MED ORDER — FENTANYL CITRATE (PF) 100 MCG/2ML IJ SOLN: 100.0000 ug | INTRAMUSCULAR | Status: DC | PRN

## 2018-06-15 MED ORDER — PHENYLEPHRINE 40 MCG/ML (10ML) SYRINGE FOR IV PUSH (FOR BLOOD PRESSURE SUPPORT)
80.0000 ug | PREFILLED_SYRINGE | INTRAVENOUS | Status: DC | PRN
  Filled 2018-06-15: qty 5

## 2018-06-15 MED ORDER — TERBUTALINE SULFATE 1 MG/ML IJ SOLN: 0.2500 mg | Freq: Once | INTRAMUSCULAR | Status: DC | PRN

## 2018-06-15 MED ORDER — OXYCODONE-ACETAMINOPHEN 5-325 MG PO TABS: 1.0000 | ORAL_TABLET | ORAL | Status: DC | PRN

## 2018-06-15 MED ORDER — LIDOCAINE HCL (PF) 1 % IJ SOLN
INTRAMUSCULAR | Status: DC | PRN
  Administered 2018-06-15: 4 mL via EPIDURAL
  Administered 2018-06-15: 6 mL via EPIDURAL

## 2018-06-15 MED ORDER — SOD CITRATE-CITRIC ACID 500-334 MG/5ML PO SOLN: 30.0000 mL | ORAL | Status: DC | PRN

## 2018-06-15 MED ORDER — FENTANYL 2.5 MCG/ML BUPIVACAINE 1/10 % EPIDURAL INFUSION (WH - ANES)
14.0000 mL/h | INTRAMUSCULAR | Status: DC | PRN
  Administered 2018-06-15 (×2): 14 mL/h via EPIDURAL
  Filled 2018-06-15 (×2): qty 100

## 2018-06-15 MED ORDER — ACETAMINOPHEN 325 MG PO TABS: 650.0000 mg | ORAL_TABLET | ORAL | Status: DC | PRN

## 2018-06-15 MED ORDER — LACTATED RINGERS IV SOLN: 500.0000 mL | Freq: Once | INTRAVENOUS | Status: DC

## 2018-06-15 MED ORDER — ONDANSETRON HCL 4 MG/2ML IJ SOLN: 4.0000 mg | Freq: Four times a day (QID) | INTRAMUSCULAR | Status: DC | PRN

## 2018-06-15 MED ORDER — PHENYLEPHRINE 40 MCG/ML (10ML) SYRINGE FOR IV PUSH (FOR BLOOD PRESSURE SUPPORT)
80.0000 ug | PREFILLED_SYRINGE | INTRAVENOUS | Status: DC | PRN
  Filled 2018-06-15: qty 10
  Filled 2018-06-15: qty 5

## 2018-06-15 MED ORDER — LACTATED RINGERS IV SOLN
INTRAVENOUS | Status: DC
  Administered 2018-06-15 (×3): via INTRAVENOUS

## 2018-06-15 MED ORDER — OXYTOCIN BOLUS FROM INFUSION
500.0000 mL | Freq: Once | INTRAVENOUS | Status: AC
  Administered 2018-06-16: 500 mL via INTRAVENOUS

## 2018-06-15 MED ORDER — FLEET ENEMA 7-19 GM/118ML RE ENEM: 1.0000 | ENEMA | RECTAL | Status: DC | PRN

## 2018-06-15 NOTE — Anesthesia Procedure Notes (Signed)
Epidural Patient location during procedure: OB Start time: 06/15/2018 3:45 PM End time: 06/15/2018 3:55 PM  Staffing Anesthesiologist: Leilani AbleHatchett, Mancil Pfenning, MD Performed: anesthesiologist   Preanesthetic Checklist Completed: patient identified, site marked, surgical consent, pre-op evaluation, timeout performed, IV checked, risks and benefits discussed and monitors and equipment checked  Epidural Patient position: sitting Prep: site prepped and draped and DuraPrep Patient monitoring: continuous pulse ox and blood pressure Approach: midline Location: L3-L4 Injection technique: LOR air  Needle:  Needle type: Tuohy  Needle gauge: 17 G Needle length: 9 cm and 9 Needle insertion depth: 7 cm Catheter type: closed end flexible Catheter size: 19 Gauge Catheter at skin depth: 12 cm Test dose: negative and Other  Assessment Sensory level: T9 Events: blood not aspirated, injection not painful, no injection resistance, negative IV test and no paresthesia  Additional Notes Reason for block:procedure for pain

## 2018-06-15 NOTE — MAU Note (Signed)
Urine in lab 

## 2018-06-15 NOTE — Anesthesia Preprocedure Evaluation (Signed)
Anesthesia Evaluation  Patient identified by MRN, date of birth, ID band Patient awake    Reviewed: Allergy & Precautions, H&P , NPO status , Patient's Chart, lab work & pertinent test results  Airway Mallampati: II  TM Distance: >3 FB Neck ROM: full    Dental no notable dental hx. (+) Teeth Intact   Pulmonary neg pulmonary ROS, former smoker,    Pulmonary exam normal breath sounds clear to auscultation       Cardiovascular negative cardio ROS Normal cardiovascular exam Rhythm:regular Rate:Normal     Neuro/Psych negative neurological ROS     GI/Hepatic negative GI ROS, Neg liver ROS,   Endo/Other  negative endocrine ROS  Renal/GU negative Renal ROS  negative genitourinary   Musculoskeletal   Abdominal (+) - obese,   Peds  Hematology negative hematology ROS (+)   Anesthesia Other Findings   Reproductive/Obstetrics (+) Pregnancy                             Anesthesia Physical Anesthesia Plan  ASA: II  Anesthesia Plan: Epidural   Post-op Pain Management:    Induction:   PONV Risk Score and Plan:   Airway Management Planned:   Additional Equipment:   Intra-op Plan:   Post-operative Plan:   Informed Consent: I have reviewed the patients History and Physical, chart, labs and discussed the procedure including the risks, benefits and alternatives for the proposed anesthesia with the patient or authorized representative who has indicated his/her understanding and acceptance.     Plan Discussed with:   Anesthesia Plan Comments:         Anesthesia Quick Evaluation

## 2018-06-15 NOTE — Discharge Instructions (Signed)
[name] °Contractions of the uterus can occur throughout pregnancy, but they are not always a sign that you are in labor. You may have practice contractions called [name]. These false labor contractions are sometimes confused with true labor. °What are [name]? °[name] are tightening movements that occur in the muscles of the uterus before labor. Unlike true labor contractions, these contractions do not result in opening (dilation) and thinning of the cervix. Toward the end of pregnancy (32-34 weeks), [name] can happen more often and may become stronger. These contractions are sometimes difficult to tell apart from true labor because they can be very uncomfortable. You should not feel embarrassed if you go to the hospital with false labor. °Sometimes, the only way to tell if you are in true labor is for your health care provider to look for changes in the cervix. The health care provider will do a physical exam and may monitor your contractions. If you are not in true labor, the exam should show that your cervix is not dilating and your water has not broken. °If there are other health problems associated with your pregnancy, it is completely safe for you to be sent home with false labor. You may continue to have [name] until you go into true labor. °How to tell the difference between true labor and false labor °True labor °· Contractions last 30-70 seconds. °· Contractions become very regular. °· Discomfort is usually felt in the top of the uterus, and it spreads to the lower abdomen and low back. °· Contractions do not go away with walking. °· Contractions usually become more intense and increase in frequency. °· The cervix dilates and gets thinner. °False labor °· Contractions are usually shorter and not as strong as true labor contractions. °· Contractions are usually irregular. °· Contractions  are often felt in the front of the lower abdomen and in the groin. °· Contractions may go away when you walk around or change positions while lying down. °· Contractions get weaker and are shorter-lasting as time goes on. °· The cervix usually does not dilate or become thin. °Follow these instructions at home: °· Take over-the-counter and prescription medicines only as told by your health care provider. °· Keep up with your usual exercises and follow other instructions from your health care provider. °· Eat and drink lightly if you think you are going into labor. °· If [name] are making you uncomfortable: °? Change your position from lying down or resting to walking, or change from walking to resting. °? Sit and rest in a tub of warm water. °? Drink enough fluid to keep your urine pale yellow. Dehydration may cause these contractions. °? Do slow and deep breathing several times an hour. °· Keep all follow-up prenatal visits as told by your health care provider. This is important. °Contact a health care provider if: °· You have a fever. °· You have continuous pain in your abdomen. °Get help right away if: °· Your contractions become stronger, more regular, and closer together. °· You have fluid leaking or gushing from your vagina. °· You pass blood-tinged mucus (bloody show). °· You have bleeding from your vagina. °· You have low back pain that you never had before. °· You feel your baby’s head pushing down and causing pelvic pressure. °· Your baby is not moving inside you as much as it used to. °Summary °· Contractions that occur before labor are called Braxton   Hicks contractions, false labor, or practice contractions. °· [name] are usually shorter, weaker, farther apart, and less regular than true labor contractions. True labor contractions usually become progressively stronger and regular and they become more frequent. °· Manage discomfort from [name] by  changing position, resting in a warm bath, drinking plenty of water, or practicing deep breathing. °This information is not intended to replace advice given to you by your health care provider. Make sure you discuss any questions you have with your health care provider. °Document Released: 04/22/2017 Document Revised: 04/22/2017 Document Reviewed: 04/22/2017 °Elsevier Interactive Patient Education © 2018 [employer] ° °

## 2018-06-15 NOTE — MAU Note (Signed)
PT SAYS UC  STRONG VE IN OFFICE  AT 37 WEEKS -  1-2  CM.  DENIES HSV AND MRSA.  GBS- NEG.   IS AN INDUCTION  TONIGHT AT [facility]

## 2018-06-15 NOTE — Telephone Encounter (Signed)
Pt states that she went to MAU around midnight with regular contractions around every 5 min apart. She was checked in MAU and was around 2 cm. She was put on monitor and sent home. She continues to have contractions regularly. Baby is moving well, small amount of blood and mucus from cervical exam.  She wanted to know if we can do anything for her. I was on the phone with patient for 7 min and she did not have any contractions that she was unable to talk through. Advised patient I will talk to [name] and see if there is anything we can do here. Advised her to rest as much as possible.

## 2018-06-15 NOTE — Telephone Encounter (Signed)
Spoke with [name] about patient she reviewed chart and my discussion with patient. She recommends that if contractions become stronger, her water breaks or baby isn't moving she should go to MAU. Otherwise wait for tonights induction. Patient voiced understanding. Advised her that she can call me back today and talk more if she needs to.

## 2018-06-15 NOTE — H&P (Addendum)
LABOR ADMISSION HISTORY AND PHYSICAL  Lindsey Jensen is a 32 y.o. female [name] with IUP at [redacted]w[redacted]d by LMP presenting for SOL. She reports +FMs, No LOF, no VB, no blurry vision, headaches or peripheral edema, and RUQ pain.  She plans on breast feeding. She request vasectomy for birth control.  Dating: By LMP --->  Estimated Date of Delivery: 06/09/18  Prenatal History/Complications: [facility] Office: [facility] Patient Active Problem List   Diagnosis Date Noted  . Supervision of normal first pregnancy 10/21/2017  . Smoker 10/07/2017  . Generalized anxiety disorder 08/23/2013    Past Medical History: Past Medical History:  Diagnosis Date  . Contraceptive education 01/26/2014  . Medical history non-contributory     Past Surgical History: Past Surgical History:  Procedure Laterality Date  . NO PAST SURGERIES      Obstetrical History: OB History    Gravida  1   Para  0   Term  0   Preterm  0   AB  0   Living  0     SAB  0   TAB  0   Ectopic  0   Multiple  0   Live Births  0           Social History: Social History   Socioeconomic History  . Marital status: Single    Spouse name: Not on file  . Number of children: Not on file  . Years of education: Not on file  . Highest education level: Not on file  Occupational History  . Not on file  Social Needs  . Financial resource strain: Not on file  . Food insecurity:    Worry: Not on file    Inability: Not on file  . Transportation needs:    Medical: Not on file    Non-medical: Not on file  Tobacco Use  . Smoking status: Former Smoker    Packs/day: 0.75    Types: Cigarettes    Last attempt to quit: 05/05/2017    Years since quitting: 1.1  . Smokeless tobacco: Never Used  Substance and Sexual Activity  . Alcohol use: No    Alcohol/week: 8.4 oz    Types: 14 Glasses of wine per week    Frequency: Never    Comment: wine before pregnancy  . Drug use: No  . Sexual activity: Yes    Birth control/protection: None   Lifestyle  . Physical activity:    Days per week: Not on file    Minutes per session: Not on file  . Stress: Not on file  Relationships  . Social connections:    Talks on phone: Not on file    Gets together: Not on file    Attends religious service: Not on file    Active member of club or organization: Not on file    Attends meetings of clubs or organizations: Not on file    Relationship status: Not on file  Other Topics Concern  . Not on file  Social History Narrative  . Not on file    Family History: Family History  Problem Relation Age of Onset  . Heart disease Father        heart attack  . Diabetes Mother   . Depression Mother   . Anxiety disorder Mother   . Heart disease Mother   . Heart disease Maternal Grandmother        CHF  . Alzheimer's disease Maternal Grandfather     Allergies: No Known Allergies  Medications Prior to Admission  Medication Sig Dispense Refill Last Dose  . clindamycin (CLINDAGEL) 1 % gel Apply topically 2 (two) times daily. (Patient taking differently: Apply topically as needed. ) 30 g 11 Taking  . Prenat-FeAsp-Meth-FA-DHA w/o A (PRENATE PIXIE) 10-0.6-0.4-200 MG CAPS Take 1 capsule by mouth daily. 30 capsule 11 06/14/2018 at Unknown time    Review of Systems   All systems reviewed and negative except as stated in HPI  Blood pressure (!) 142/88, pulse (!) 108, temperature 97.8 F (36.6 C), temperature source Oral, resp. rate 18, last menstrual period 09/02/2017. General appearance: alert, cooperative and no distress HEENT: no JVD, MMM Abdomen: soft, non-tender; bowel sounds normal Extremities: Homans sign is negative, no sign of DVT Presentation: cephalic Fetal monitoringBaseline: 150 bpm, Variability: Good {> 6 bpm), Accelerations: Reactive and Decelerations: Early Uterine activity: ctx 2-4 min Dilation: 5 Effacement (%): 90 Station: -2 Exam by:: G Morris RN   Prenatal labs: ABO, Rh: O/Positive/-- (11/01 1627) Antibody:  Negative (04/05 0907) Rubella: 3.24 (11/01 1627) RPR: Non Reactive (04/05 0907)  HBsAg: Negative (11/01 1627)  HIV: Non Reactive (04/05 0907)  GBS: Negative (05/30 1645)  2 hr Glucola wnl  Prenatal Transfer Tool  Maternal Diabetes: No Genetic Screening: Normal Maternal Ultrasounds/Referrals: Normal Fetal Ultrasounds or other Referrals:  None Maternal Substance Abuse:  Yes:  Type: Smoker Significant Maternal Medications:  None Significant Maternal Lab Results: None  No results found for this or any previous visit (from the past 24 hour(s)).  Patient Active Problem List   Diagnosis Date Noted  . Supervision of normal first pregnancy 10/21/2017  . Smoker 10/07/2017  . Generalized anxiety disorder 08/23/2013    Assessment: Lindsey Jensen is a 32 y.o. [name] at 5850w6d here for SOL  #Labor: active labor, expectant mgmt #Pain: epidural #FWB: Cat I #ID:  GBS neg #MOF: Breast #MOC:Vasectomy #Circ:  Inpt   Thomes DinningBrad Thompson, MD, MS FAMILY MEDICINE RESIDENT - PGY1 06/15/2018 2:52 PM  OB FELLOW HISTORY AND PHYSICAL ATTESTATION  I confirm that I have verified the information documented in the resident's note and that I have also personally reperformed the physical exam and all medical decision making activities. I agree with above documentation and have made edits as needed.   Caryl AdaJazma Samanthia Howland, DO OB Fellow 06/15/2018, 4:26 PM

## 2018-06-15 NOTE — MAU Note (Signed)
Pt having ctx. 

## 2018-06-15 NOTE — Progress Notes (Signed)
LABOR PROGRESS NOTE  Lindsey [name] is a 32 y.o. [name] at 8119w6d  admitted for SOL  Subjective: Pt is comfortable with epidural  Objective: BP 129/80   Pulse 92   Temp 97.8 F (36.6 C) (Oral)   Resp 16   Ht 5\' 8"  (1.727 m)   Wt 210 lb (95.3 kg)   LMP 09/02/2017 (Exact Date)   SpO2 100%   BMI 31.93 kg/m  or  Vitals:   06/15/18 1636 06/15/18 1640 06/15/18 1641 06/15/18 1701  BP: 130/69  128/80 129/80  Pulse: 72  74 92  Resp:   18 16  Temp:      TempSrc:      SpO2:  100%    Weight:      Height:         Dilation: 8 Effacement (%): 80, 90 Cervical Position: Middle Station: -1 Presentation: Vertex Exam by:: Devra DoppAmber Knox, RN  FHT: 140, mod var, reactive, variable Uterine activity: ctx 2-4 min  Labs: Lab Results  Component Value Date   WBC 14.3 (H) 06/15/2018   HGB 13.4 06/15/2018   HCT 38.2 06/15/2018   MCV 91.6 06/15/2018   PLT 169 06/15/2018    Patient Active Problem List   Diagnosis Date Noted  . Normal labor 06/15/2018  . Supervision of normal first pregnancy 10/21/2017  . Smoker 10/07/2017  . Generalized anxiety disorder 08/23/2013    Assessment / Plan: 32 y.o. [name] at 6519w6d here for SOL  Labor: progressing well, s/p AROM (clear)  Fetal Wellbeing:  Cat I Pain Control:  Epidural  Anticipated MOD:  NSVD  Garnette GunnerAaron B Lametria Klunk, MD PGY-1 6/26/20195:27 PM

## 2018-06-15 NOTE — Progress Notes (Signed)
LABOR PROGRESS NOTE  Colon [name] is a 32 y.o. [name] at 7267w6d  admitted for SOL  Subjective: Feeling more pressure  Objective: BP 121/87   Pulse 89   Temp 97.9 F (36.6 C) (Oral)   Resp 18   Ht 5\' 8"  (1.727 m)   Wt 95.3 kg (210 lb)   LMP 09/02/2017 (Exact Date)   SpO2 99%   BMI 31.93 kg/m  or  Vitals:   06/15/18 1901 06/15/18 1930 06/15/18 2000 06/15/18 2031  BP: 114/67 125/82 127/83 121/87  Pulse: 93 79 90 89  Resp: 18 18 18 18   Temp:   97.9 F (36.6 C)   TempSrc:   Oral   SpO2:      Weight:      Height:         Dilation: 9 Effacement (%): 100 Cervical Position: Middle Station: Plus 1 Presentation: Vertex Exam by:: Dr. Doroteo GlassmanPhelps  FHT: 145, mod var, reactive, no decel Uterine activity: ctx 2-4 min  Labs: Lab Results  Component Value Date   WBC 14.3 (H) 06/15/2018   HGB 13.4 06/15/2018   HCT 38.2 06/15/2018   MCV 91.6 06/15/2018   PLT 169 06/15/2018    Patient Active Problem List   Diagnosis Date Noted  . Normal labor 06/15/2018  . Supervision of normal first pregnancy 10/21/2017  . Smoker 10/07/2017  . Generalized anxiety disorder 08/23/2013    Assessment / Plan: 32 y.o. [name] at 5967w6d here for SOL.  Labor: progressing well, s/p AROM (clear). No augmentation at this time.  Fetal Wellbeing:  Cat I Pain Control:  Epidural  Anticipated MOD:  NSVD  Lindsey AdaJazma Phelps, DO  6/26/20198:36 PM

## 2018-06-15 NOTE — Telephone Encounter (Signed)
Pt wants to speak to a nurse ASAP/ I gave her the message from the nurses that she needed to go to MAU if in that much pain, She really wants to talk to someone before she goes back to MAU

## 2018-06-16 ENCOUNTER — Encounter (HOSPITAL_COMMUNITY): Payer: Self-pay

## 2018-06-16 ENCOUNTER — Inpatient Hospital Stay (HOSPITAL_COMMUNITY): Admission: RE | Admit: 2018-06-16 | Payer: BLUE CROSS/BLUE SHIELD | Source: Ambulatory Visit

## 2018-06-16 DIAGNOSIS — Z3A40 40 weeks gestation of pregnancy: Secondary | ICD-10-CM

## 2018-06-16 LAB — RPR: RPR: NONREACTIVE

## 2018-06-16 LAB — CBC
HCT: 32.1 % — ABNORMAL LOW (ref 36.0–46.0)
Hemoglobin: 11.3 g/dL — ABNORMAL LOW (ref 12.0–15.0)
MCH: 32.7 pg (ref 26.0–34.0)
MCHC: 35.2 g/dL (ref 30.0–36.0)
MCV: 92.8 fL (ref 78.0–100.0)
PLATELETS: 146 10*3/uL — AB (ref 150–400)
RBC: 3.46 MIL/uL — AB (ref 3.87–5.11)
RDW: 13.7 % (ref 11.5–15.5)
WBC: 16.3 10*3/uL — AB (ref 4.0–10.5)

## 2018-06-16 MED ORDER — IBUPROFEN 600 MG PO TABS
600.0000 mg | ORAL_TABLET | Freq: Four times a day (QID) | ORAL | Status: DC
  Administered 2018-06-16 – 2018-06-18 (×9): 600 mg via ORAL
  Filled 2018-06-16 (×9): qty 1

## 2018-06-16 MED ORDER — TETANUS-DIPHTH-ACELL PERTUSSIS 5-2.5-18.5 LF-MCG/0.5 IM SUSP: 0.5000 mL | Freq: Once | INTRAMUSCULAR | Status: DC

## 2018-06-16 MED ORDER — OXYTOCIN 40 UNITS IN LACTATED RINGERS INFUSION - SIMPLE MED
1.0000 m[IU]/min | INTRAVENOUS | Status: DC
  Administered 2018-06-16: 2 m[IU]/min via INTRAVENOUS

## 2018-06-16 MED ORDER — OXYCODONE HCL 5 MG PO TABS: 10.0000 mg | ORAL_TABLET | ORAL | Status: DC | PRN

## 2018-06-16 MED ORDER — METHYLERGONOVINE MALEATE 0.2 MG PO TABS: 0.2000 mg | ORAL_TABLET | ORAL | Status: DC | PRN

## 2018-06-16 MED ORDER — SIMETHICONE 80 MG PO CHEW: 80.0000 mg | CHEWABLE_TABLET | ORAL | Status: DC | PRN

## 2018-06-16 MED ORDER — DIBUCAINE 1 % RE OINT
1.0000 "application " | TOPICAL_OINTMENT | RECTAL | Status: DC | PRN
  Administered 2018-06-16: 1 via RECTAL
  Filled 2018-06-16: qty 28

## 2018-06-16 MED ORDER — BENZOCAINE-MENTHOL 20-0.5 % EX AERO
1.0000 "application " | INHALATION_SPRAY | CUTANEOUS | Status: DC | PRN
  Administered 2018-06-16 – 2018-06-18 (×2): 1 via TOPICAL
  Filled 2018-06-16 (×2): qty 56

## 2018-06-16 MED ORDER — METHYLERGONOVINE MALEATE 0.2 MG/ML IJ SOLN: 0.2000 mg | INTRAMUSCULAR | Status: DC | PRN

## 2018-06-16 MED ORDER — PRENATAL MULTIVITAMIN CH
1.0000 | ORAL_TABLET | Freq: Every day | ORAL | Status: DC
  Administered 2018-06-16 – 2018-06-18 (×3): 1 via ORAL
  Filled 2018-06-16 (×3): qty 1

## 2018-06-16 MED ORDER — COCONUT OIL OIL
1.0000 "application " | TOPICAL_OIL | Status: DC | PRN
  Administered 2018-06-17: 1 via TOPICAL
  Filled 2018-06-16: qty 120

## 2018-06-16 MED ORDER — ONDANSETRON HCL 4 MG/2ML IJ SOLN: 4.0000 mg | INTRAMUSCULAR | Status: DC | PRN

## 2018-06-16 MED ORDER — ONDANSETRON HCL 4 MG PO TABS: 4.0000 mg | ORAL_TABLET | ORAL | Status: DC | PRN

## 2018-06-16 MED ORDER — ZOLPIDEM TARTRATE 5 MG PO TABS: 5.0000 mg | ORAL_TABLET | Freq: Every evening | ORAL | Status: DC | PRN

## 2018-06-16 MED ORDER — TERBUTALINE SULFATE 1 MG/ML IJ SOLN
0.2500 mg | Freq: Once | INTRAMUSCULAR | Status: DC | PRN
  Filled 2018-06-16: qty 1

## 2018-06-16 MED ORDER — ACETAMINOPHEN 325 MG PO TABS: 650.0000 mg | ORAL_TABLET | ORAL | Status: DC | PRN

## 2018-06-16 MED ORDER — WITCH HAZEL-GLYCERIN EX PADS
1.0000 "application " | MEDICATED_PAD | CUTANEOUS | Status: DC | PRN
  Administered 2018-06-16: 1 via TOPICAL

## 2018-06-16 MED ORDER — DIPHENHYDRAMINE HCL 25 MG PO CAPS: 25.0000 mg | ORAL_CAPSULE | Freq: Four times a day (QID) | ORAL | Status: DC | PRN

## 2018-06-16 MED ORDER — SENNOSIDES-DOCUSATE SODIUM 8.6-50 MG PO TABS
2.0000 | ORAL_TABLET | ORAL | Status: DC
  Administered 2018-06-17 (×2): 2 via ORAL
  Filled 2018-06-16 (×2): qty 2

## 2018-06-16 MED ORDER — OXYCODONE HCL 5 MG PO TABS: 5.0000 mg | ORAL_TABLET | ORAL | Status: DC | PRN

## 2018-06-16 NOTE — Plan of Care (Signed)
  Problem: Elimination: Goal: Will not experience complications related to urinary retention Outcome: Progressing Note:  Pt states she has been voiding frequently & appropriate amts.   Problem: Pain Managment: Goal: General experience of comfort will improve Outcome: Progressing Note:  Pt has required warm compresses for abdm pain & scheduled Motrin.  Given meds for hemhorroids.   Problem: Education: Goal: Knowledge of condition will improve Outcome: Progressing   Problem: Activity: Goal: Will verbalize the importance of balancing activity with adequate rest periods Outcome: Progressing Goal: Ability to tolerate increased activity will improve Outcome: Progressing Note:  Pt ambulates in room without difficulty.  She has been encouraged to ambulate in hallway as well.   Problem: Coping: Goal: Ability to identify and utilize available resources and services will improve Outcome: Progressing   Problem: Life Cycle: Goal: Chance of risk for complications during the postpartum period will decrease Outcome: Progressing Note:  Lindsey Jensen has been firm & midline for most of the day.  Pt has been instructed to void frequently & empty bladder.  Lochia WNL without clots   Problem: Role Relationship: Goal: Ability to demonstrate positive interaction with newborn will improve Outcome: Progressing

## 2018-06-16 NOTE — Lactation Note (Addendum)
This note was copied from a baby's chart. Lactation Consultation Note  Patient Name: Boy Earlene PlaterGina Torbeck ZOXWR'UToday's Date: 06/16/2018 Reason for consult: Initial assessment   P1, Baby 11 hours.  Nipples evert and compressible. Mother hand expressed good flow of colostrum prior to attempting to latch. Baby sleepy and did not latch and was recently bf. Mom encouraged to feed baby 8-12 times/24 hours and with feeding cues.  Reviewed basics.  Mom made aware of O/P services, breastfeeding support groups, community resources, and our phone # for post-discharge questions.  Discussed pumping and going back to work.    Maternal Data Has patient been taught Hand Expression?: Yes  Feeding Feeding Type: Breast Fed Length of feed: 10 min  LATCH Score Latch: Too sleepy or reluctant, no latch achieved, no sucking elicited.  Audible Swallowing: None  Type of Nipple: Everted at rest and after stimulation  Comfort (Breast/Nipple): Soft / non-tender  Hold (Positioning): Assistance needed to correctly position infant at breast and maintain latch.  LATCH Score: 5  Interventions Interventions: Hand express;Support pillows;Position options;Adjust position;Breast feeding basics reviewed  Lactation Tools Discussed/Used     Consult Status Consult Status: Follow-up Date: 06/17/18 Follow-up type: In-patient    Dahlia ByesBerkelhammer, [facility] 06/16/2018, 2:08 PM

## 2018-06-16 NOTE — Progress Notes (Signed)
LABOR PROGRESS NOTE  Lindsey [name] is a 32 y.o. [name] at 7665w6d  admitted for SOL  Subjective: Doing well. Requesting to rest.   Objective: BP 119/70   Pulse 84   Temp 97.9 F (36.6 C) (Oral)   Resp 18   Ht 5\' 8"  (1.727 m)   Wt 95.3 kg (210 lb)   LMP 09/02/2017 (Exact Date)   SpO2 99%   BMI 31.93 kg/m  or  Vitals:   06/15/18 2231 06/15/18 2301 06/15/18 2332 06/16/18 0007  BP: 129/87 (!) 92/54 117/81 119/70  Pulse: (!) 107 97 97 84  Resp: 18 18 18 18   Temp:      TempSrc:      SpO2:      Weight:      Height:         Dilation: 10 Dilation Complete Date: 06/15/18 Dilation Complete Time: 2338 Effacement (%): 100 Cervical Position: Middle Station: Plus 2 Presentation: Vertex Exam by:: Carney LivingHanna Beitz RN  FHT: 150, mod var, reactive, no decel Uterine activity: ctx 2-5 min  Labs: Lab Results  Component Value Date   WBC 14.3 (H) 06/15/2018   HGB 13.4 06/15/2018   HCT 38.2 06/15/2018   MCV 91.6 06/15/2018   PLT 169 06/15/2018    Patient Active Problem List   Diagnosis Date Noted  . Normal labor 06/15/2018  . Supervision of normal first pregnancy 10/21/2017  . Smoker 10/07/2017  . Generalized anxiety disorder 08/23/2013    Assessment / Plan: 32 y.o. [name] at 3465w6d here for SOL.  Labor: will augment with pitocin as contractions are spacing. Plan on laboring down then trial of pushing. Fetal Wellbeing:  Cat I Pain Control:  Epidural  Anticipated MOD:  NSVD  Caryl AdaJazma Ceyda Peterka, DO  6/27/201912:21 AM

## 2018-06-16 NOTE — Anesthesia Postprocedure Evaluation (Signed)
Anesthesia Post Note  Patient: Lindsey Jensen  Procedure(s) Performed: AN AD HOC LABOR EPIDURAL     Patient location during evaluation: Mother Baby Anesthesia Type: Epidural Level of consciousness: awake and alert Pain management: pain level controlled Vital Signs Assessment: post-procedure vital signs reviewed and stable Respiratory status: spontaneous breathing, nonlabored ventilation and respiratory function stable Cardiovascular status: stable Postop Assessment: no headache, no backache and epidural receding Anesthetic complications: no    Last Vitals:  Vitals:   06/16/18 0430 06/16/18 0526  BP: 118/74 131/81  Pulse: 79 78  Resp: 17 18  Temp: 37.1 C 36.9 C  SpO2:  98%    Last Pain:  Vitals:   06/16/18 0526  TempSrc: Oral  PainSc: 0-No pain   Pain Goal: Patients Stated Pain Goal: 8 (06/15/18 1703)               Junious SilkGILBERT,Reika Callanan

## 2018-06-17 ENCOUNTER — Encounter (HOSPITAL_COMMUNITY): Payer: Self-pay | Admitting: *Deleted

## 2018-06-17 NOTE — Progress Notes (Signed)
Post Partum Day 1 Subjective: no complaints, up ad lib, voiding, tolerating PO and + flatus  Objective: Blood pressure 110/72, pulse (!) 59, temperature 97.8 F (36.6 C), temperature source Oral, resp. rate 18, height 5\' 8"  (1.727 m), weight 210 lb (95.3 kg), last menstrual period 09/02/2017, SpO2 98 %, unknown if currently breastfeeding.  Physical Exam:  General: alert, cooperative and no distress Lochia: appropriate Uterine Fundus: firm Incision: n/a DVT Evaluation: No evidence of DVT seen on physical exam.  Recent Labs    06/15/18 1457 06/16/18 0555  HGB 13.4 11.3*  HCT 38.2 32.1*    Assessment/Plan: Plan for discharge tomorrow and Circumcision prior to discharge   LOS: 2 days   Lindsey Jensen 06/17/2018, 9:54 AM

## 2018-06-17 NOTE — Lactation Note (Signed)
This note was copied from a baby's chart. Lactation Consultation Note; Mother reports that she has some pinching pain when latching infant . Observed mothers nipples and no noted trauma.  Reviewed hand expression with mother and observed large amts.of colostrum.  Mother has her own pump with her . She request assistance putting it together before going home. Mother has a Child psychotherapistLansinoh smart pump. I informed mother that I am not knowledgeable about  this pump. I encouraged her to go to pump website or youtube on how to assemble pump.    Mother unsure of her abilities to continue with breastfeeding. LC gave lots of encouragement and support at this time.   Mother became receptive to teaching .  Informed mother that we needed to assist with latching infant with proper latch that is not painful. Mother understood and she wants help with latching infant .  Infant placed in cross cradle hold. Instruct mother in proper technique to use firm support and good alignment.  Infant latched on with a shallow latch. Mother complaints slight discomfort. Mother taught to flange infants lips for wide gape. Infants upper lip rolled up and adjust infants lower jaw for wide gape. Mother taught to latch with an off sided latch. Infant sustained latch for 18 mins. Observed frequent suckles and audible swallows.  Infant placed on alternate breast in football hold. Infant sustained latch  For 15-20 mins.   Advised mother to continue to cue base feeding. Informed mother that infant will likely cluster feed this pm. Mother reports that she has only had 3 hours of sleep since infant born. Husband is at home taking care of home.   Advised mother to page staff nurse for assistance with latch when husband returns so he can be advised how to assist when she gets home.  Mother needed lots of support and encouragement. Mother receptive to teaching but has lots of anxiety on how she is going to do this without supervision when she gets  home.   Mother informed that LC s are available 7 days a week by phone and [facility] . [facility].   Mother advised to nap when husband gets back and have him to do frequent skin to skin ,. Mother to feed infant 8-12 times and with all feeding cues.      Patient Name: Lindsey Jensen ZOXWR'UToday's Date: 06/17/2018 Reason for consult: Follow-up assessment   Maternal Data    Feeding Feeding Type: Breast Fed Length of feed: 15 min(infant still feeding when I left the room)  LATCH Score Latch: Grasps breast easily, tongue down, lips flanged, rhythmical sucking.  Audible Swallowing: Spontaneous and intermittent  Type of Nipple: Everted at rest and after stimulation  Comfort (Breast/Nipple): Soft / non-tender  Hold (Positioning): Assistance needed to correctly position infant at breast and maintain latch.  LATCH Score: 9  Interventions Interventions: Assisted with latch;Skin to skin;Hand express;Breast compression;Adjust position;Support pillows;Position options;Expressed milk  Lactation Tools Discussed/Used     Consult Status Consult Status: Follow-up Date: 06/18/18 Follow-up type: In-patient    Stevan BornKendrick, Yarissa Reining [facility] 06/17/2018, 3:13 PM

## 2018-06-17 NOTE — Progress Notes (Signed)
MOB was referred for history of depression/anxiety. * Referral screened out by Clinical Social Worker because none of the following criteria appear to apply: ~ History of anxiety/depression during this pregnancy, or of post-partum depression. ~ Diagnosis of anxiety and/or depression within last 3 years (2014) OR * MOB's symptoms currently being treated with medication and/or therapy. Please contact the Clinical Social Worker if needs arise, by [facility] request, or if MOB scores greater than 9/yes to question 10 on [place] Postpartum Depression Screen.

## 2018-06-18 MED ORDER — IBUPROFEN 600 MG PO TABS: 600.0000 mg | ORAL_TABLET | Freq: Four times a day (QID) | ORAL | 0 refills | Status: DC

## 2018-06-18 NOTE — Discharge Summary (Signed)
OB Discharge Summary     Patient Name: Lindsey Jensen DOB: Jul 29, 1986 MRN: [id number]  Date of admission: 06/15/2018 Delivering MD: Lazaro Arms   Date of discharge: 06/18/2018  Admitting diagnosis: 40.6WKS CTX Intrauterine pregnancy: [redacted]w[redacted]d     Secondary diagnosis:  Active Problems:   Normal labor   SVD (spontaneous vaginal delivery)  Additional problems: None     Discharge diagnosis: Term Pregnancy Delivered                                                                                                Post partum procedures:none  Augmentation: AROM and Pitocin  Complications: None  Hospital course:  Onset of Labor With Vaginal Delivery     32 y.o. yo G1P1001 at [redacted]w[redacted]d was admitted in Active Labor on 06/15/2018. Patient had an uncomplicated labor course as follows:  Membrane Rupture Time/Date: 5:24 PM ,06/15/2018   Intrapartum Procedures: Episiotomy: None [1]                                         Lacerations:  2nd degree [3];Perineal [11]  Patient had a delivery of a Viable infant. 06/16/2018  Information for the patient's newborn:  Rozelle, Caudle [[id number]]  Delivery Method: Vag-Spont    Pateint had an uncomplicated postpartum course.  She is ambulating, tolerating a regular diet, passing flatus, and urinating well. Patient is discharged home in stable condition on 06/18/18.   Physical exam  Vitals:   06/17/18 0439 06/17/18 1522 06/17/18 2239 06/18/18 0520  BP: 110/72 111/76 109/79 118/80  Pulse: (!) 59 64 74 65  Resp: 18 18  16   Temp: 97.8 F (36.6 C) 98.5 F (36.9 C) 98.4 F (36.9 C) 97.7 F (36.5 C)  TempSrc: Oral Oral Oral   SpO2: 98%     Weight:      Height:       General: alert, cooperative and no distress Lochia: appropriate Uterine Fundus: firm Incision: N/A DVT Evaluation: No evidence of DVT seen on physical exam. Labs: Lab Results  Component Value Date   WBC 16.3 (H) 06/16/2018   HGB 11.3 (L) 06/16/2018   HCT 32.1 (L) 06/16/2018   MCV 92.8  06/16/2018   PLT 146 (L) 06/16/2018   CMP Latest Ref Rng & Units 09/08/2016  Glucose 65 - 99 mg/dL 98  BUN 6 - 20 mg/dL 11  Creatinine 8.65 - 7.84 mg/dL 6.96  Sodium 295 - 284 mmol/L 136  Potassium 3.5 - 5.2 mmol/L 4.4  Chloride 96 - 106 mmol/L 95(L)  CO2 18 - 29 mmol/L 25  Calcium 8.7 - 10.2 mg/dL 9.6  Total Protein 6.0 - 8.5 g/dL 7.2  Total Bilirubin 0.0 - 1.2 mg/dL 0.9  Alkaline Phos 39 - 117 IU/L 67  AST 0 - 40 IU/L 32  ALT 0 - 32 IU/L 39(H)    Discharge instruction: per After Visit Summary and "Baby and Me Booklet".  After visit meds:  Allergies as of 06/18/2018   No Known  Allergies     Medication List    TAKE these medications   acetaminophen 500 MG tablet Commonly known as:  TYLENOL Take 500 mg by mouth every 6 (six) hours as needed for mild pain or headache.   clindamycin 1 % gel Commonly known as:  CLINDAGEL Apply topically 2 (two) times daily. What changed:    when to take this  reasons to take this   ibuprofen 600 MG tablet Commonly known as:  ADVIL,MOTRIN Take 1 tablet (600 mg total) by mouth every 6 (six) hours.   PRENATE PIXIE 10-0.6-0.4-200 MG Caps Take 1 capsule by mouth daily.       Diet: routine diet  Activity: Advance as tolerated. Pelvic rest for 6 weeks.   Outpatient follow up:6 weeks Follow up Appt: Future Appointments  Date Time Provider Department Center  07/21/2018 11:30 AM Cheral MarkerBooker, Kimberly R, CNM [facility]   Follow up Visit:No follow-ups on file.  Postpartum contraception: Vasectomy  Newborn Data: Live born female  Birth Weight: 7 lb 7.6 oz (3391 g) APGAR: 9, 9  Newborn Delivery   Birth date/time:  06/16/2018 02:34:00 Delivery type:  Vaginal, Spontaneous     Baby Feeding: Breast Disposition:home with mother   06/18/2018 Sharen CounterLisa Leftwich-Kirby, CNM

## 2018-06-18 NOTE — Lactation Note (Signed)
This note was copied from a baby's chart. Lactation Consultation Note  Patient Name: [name] UJWJX'BToday's Date: 06/18/2018 Reason for consult: Follow-up assessment Mom reports feeling good about feedings.  She states the assistance she received yesterday was helpful.  Discussed milk coming to volume and engorgement treatment.  Lactation outpatient services and support information reviewed and encouraged prn.  Mom denies questions or concerns.   Maternal Data    Feeding Feeding Type: Breast Fed Length of feed: 15 min  LATCH Score                   Interventions    Lactation Tools Discussed/Used     Consult Status Consult Status: Complete Follow-up type: Call as needed    Huston FoleyMOULDEN, Zhana Jeangilles S 06/18/2018, 9:46 AM

## 2018-07-11 ENCOUNTER — Ambulatory Visit (INDEPENDENT_AMBULATORY_CARE_PROVIDER_SITE_OTHER): Payer: BLUE CROSS/BLUE SHIELD | Admitting: Women's Health

## 2018-07-11 ENCOUNTER — Encounter: Payer: Self-pay | Admitting: Women's Health

## 2018-07-11 ENCOUNTER — Telehealth: Payer: Self-pay | Admitting: *Deleted

## 2018-07-11 VITALS — BP 123/73 | HR 89 | Ht 68.0 in | Wt 187.2 lb

## 2018-07-11 DIAGNOSIS — G4459 Other complicated headache syndrome: Secondary | ICD-10-CM

## 2018-07-11 DIAGNOSIS — R55 Syncope and collapse: Secondary | ICD-10-CM | POA: Diagnosis not present

## 2018-07-11 DIAGNOSIS — M7918 Myalgia, other site: Secondary | ICD-10-CM | POA: Diagnosis not present

## 2018-07-11 LAB — POCT HEMOGLOBIN: Hemoglobin: 10.2 g/dL — AB (ref 12.2–16.2)

## 2018-07-11 NOTE — Telephone Encounter (Signed)
Patient called stating she has been experiencing headaches weekly along with upper abdominal pain near her rib cage.  She is taking Ibuprofen which helps some but comes back in an hour.  She describes the pain in her upper abdomen as very tight.  Delivered almost 4 weeks ago but will w/i to r/o hypertension issues.

## 2018-07-11 NOTE — Patient Instructions (Signed)
For Headaches:   Stay well hydrated, drink enough water so that your urine is clear, sometimes if you are dehydrated you can get headaches  Eat small frequent meals and snacks, sometimes if you are hungry you can get headaches  Sometimes you get headaches during pregnancy from the pregnancy hormones  You can try tylenol (1-2 regular strength 325mg or 1-2 extra strength 500mg) as directed on the box. The least amount of medication that works is best.   Cool compresses (cool wet washcloth or ice pack) to area of head that is hurting  You can also try drinking a caffeinated drink to see if this will help  If not helping, try below:  For Prevention of Headaches/Migraines:  CoQ10 100mg three times daily  Vitamin B2 400mg daily  Magnesium Oxide 400-600mg daily  If You Get a Bad Headache/Migraine:  Benadryl 25mg   Magnesium Oxide  1 large Gatorade  2 extra strength Tylenol (1,000mg total)  1 cup coffee or Coke  If this doesn't help please call us @ [phone]   

## 2018-07-11 NOTE — Progress Notes (Signed)
   GYN VISIT Patient name: Colon FlatteryGina M Christian MRN [id number]  Date of birth: [birth year] Chief Complaint:   Headache (abdominal pain/ started last week with headache)  History of Present Illness:   Colon FlatteryGina M Khim is a 32 y.o. 301P1001 Caucasian female 4wks s/p SVB being seen today as a work-in for report of achy/crampy intermittent epigastric pain radiating into Lt upper breast/Lt axilla/Lt shoulder and down into Lt arm x 2wks-happens at least daily.  Bottlefeeding. Also reports seeing shapes 'like looking in a kaleidoscope' then headache that starts in band distribution just behind front of head, goes down into back/into neck at least daily. Sometimes will be just on either right or left side of head. Taking 800mg  ibuprofen q few hours without much relief. No h/o headaches/migraines. Took bp during headache and are normal, 120s/70s. Denies n/v. Is staying well hydrated, eating well, gets at least 5 hours of uninterrupted sleep/night.   No LMP recorded. The current method of family planning is abstinence. Review of Systems:   Pertinent items are noted in HPI Denies fever/chills, dizziness, headaches, visual disturbances, fatigue, shortness of breath, chest pain, abdominal pain, vomiting, abnormal vaginal discharge/itching/odor/irritation, problems with periods, bowel movements, urination, or intercourse unless otherwise stated above.  Pertinent History Reviewed:  Reviewed past medical,surgical, social, obstetrical and family history.  Reviewed problem list, medications and allergies. Physical Assessment:   Vitals:   07/11/18 1047  BP: 123/73  Pulse: 89  Weight: 187 lb 3.2 oz (84.9 kg)  Height: 5\' 8"  (1.727 m)  Body mass index is 28.46 kg/m.       Physical Examination:   General appearance: alert, well appearing, and in no distress  Mental status: alert, oriented to person, place, and time  Skin: warm & dry   Cardiovascular: normal heart rate noted, HRRR  Respiratory: normal respiratory effort, no  distress, LCTAB  Breasts: no erythema, no s/s mastitis  Abdomen: soft, non-tender   Pelvic: examination not indicated  Extremities: no edema   Assessment & Plan:  1) Daily epigastric/Lt breast/Lt axilla/Lt arm pain> discussed w/ LHE, likely musculoskeletal d/t holding baby/car seat/etc. Space out ibuprofen, max 800mg  q 6hr  2) New onset daily headaches w/ preceding visual changes> bp normal, per LHE atypical headaches, get CT (scheduled for 7/24 @ 1000 at [facility]) and refer to headache clinic(referral sent today)  Meds: No orders of the defined types were placed in this encounter.  Return for As scheduled 8/1 for pp visit.  Cheral MarkerKimberly R Anshul Meddings CNM, [facility] 07/11/2018 12:57 PM

## 2018-07-13 ENCOUNTER — Ambulatory Visit (HOSPITAL_COMMUNITY)
Admission: RE | Admit: 2018-07-13 | Discharge: 2018-07-13 | Disposition: A | Discharge status: Home / Self Care | Payer: BLUE CROSS/BLUE SHIELD | Source: Ambulatory Visit | Attending: Women's Health | Admitting: Women's Health

## 2018-07-13 DIAGNOSIS — G4459 Other complicated headache syndrome: Secondary | ICD-10-CM | POA: Insufficient documentation

## 2018-07-13 DIAGNOSIS — R51 Headache: Secondary | ICD-10-CM | POA: Diagnosis not present

## 2018-07-13 MED ORDER — IOPAMIDOL (ISOVUE-300) INJECTION 61%
75.0000 mL | Freq: Once | INTRAVENOUS | Status: AC | PRN
  Administered 2018-07-13: 75 mL via INTRAVENOUS

## 2018-07-21 ENCOUNTER — Ambulatory Visit (INDEPENDENT_AMBULATORY_CARE_PROVIDER_SITE_OTHER): Payer: BLUE CROSS/BLUE SHIELD | Admitting: Women's Health

## 2018-07-21 ENCOUNTER — Encounter: Payer: Self-pay | Admitting: Women's Health

## 2018-07-21 DIAGNOSIS — Z30011 Encounter for initial prescription of contraceptive pills: Secondary | ICD-10-CM

## 2018-07-21 MED ORDER — LO LOESTRIN FE 1 MG-10 MCG / 10 MCG PO TABS: 1.0000 | ORAL_TABLET | Freq: Every day | ORAL | 3 refills | Status: DC

## 2018-07-21 MED ORDER — NORETHINDRONE 0.35 MG PO TABS: 1.0000 | ORAL_TABLET | Freq: Every day | ORAL | 11 refills | Status: DC

## 2018-07-21 NOTE — Patient Instructions (Signed)
Oral Contraception Use Oral contraceptive pills (OCPs) are medicines taken to prevent pregnancy. OCPs work by preventing the ovaries from releasing eggs. The hormones in OCPs also cause the cervical mucus to thicken, preventing the sperm from entering the uterus. The hormones also cause the uterine lining to become thin, not allowing a fertilized egg to attach to the inside of the uterus. OCPs are highly effective when taken exactly as prescribed. However, OCPs do not prevent sexually transmitted diseases (STDs). Safe sex practices, such as using condoms along with an OCP, can help prevent STDs. Before taking OCPs, you may have a physical exam and Pap test. Your health care provider may also order blood tests if necessary. Your health care provider will make sure you are a good candidate for oral contraception. Discuss with your health care provider the possible side effects of the OCP you may be prescribed. When starting an OCP, it can take 2 to 3 months for the body to adjust to the changes in hormone levels in your body. How to take oral contraceptive pills Your health care provider may advise you on how to start taking the first cycle of OCPs. Otherwise, you can:  Start on day 1 of your menstrual period. You will not need any backup contraceptive protection with this start time.  Start on the first Sunday after your menstrual period or the day you get your prescription. In these cases, you will need to use backup contraceptive protection for the first week.  Start the pill at any time of your cycle. If you take the pill within 5 days of the start of your period, you are protected against pregnancy right away. In this case, you will not need a backup form of birth control. If you start at any other time of your menstrual cycle, you will need to use another form of birth control for 7 days. If your OCP is the type called a minipill, it will protect you from pregnancy after taking it for 2 days (48  hours).  After you have started taking OCPs:  If you forget to take 1 pill, take it as soon as you remember. Take the next pill at the regular time.  If you miss 2 or more pills, call your health care provider because different pills have different instructions for missed doses. Use backup birth control until your next menstrual period starts.  If you use a 28-day pack that contains inactive pills and you miss 1 of the last 7 pills (pills with no hormones), it will not matter. Throw away the rest of the non-hormone pills and start a new pill pack.  No matter which day you start the OCP, you will always start a new pack on that same day of the week. Have an extra pack of OCPs and a backup contraceptive method available in case you miss some pills or lose your OCP pack. Follow these instructions at home:  Do not smoke.  Always use a condom to protect against STDs. OCPs do not protect against STDs.  Use a calendar to mark your menstrual period days.  Read the information and directions that came with your OCP. Talk to your health care provider if you have questions. Contact a health care provider if:  You develop nausea and vomiting.  You have abnormal vaginal discharge or bleeding.  You develop a rash.  You miss your menstrual period.  You are losing your hair.  You need treatment for mood swings or depression.  You   get dizzy when taking the OCP.  You develop acne from taking the OCP.  You become pregnant. Get help right away if:  You develop chest pain.  You develop shortness of breath.  You have an uncontrolled or severe headache.  You develop numbness or slurred speech.  You develop visual problems.  You develop pain, redness, and swelling in the legs. This information is not intended to replace advice given to you by your health care provider. Make sure you discuss any questions you have with your health care provider. Document Released: 11/26/2011 Document  Revised: 05/14/2016 Document Reviewed: 05/28/2013 Elsevier Interactive Patient Education  2017 Elsevier [employer].  Ethinyl Estradiol; Norethindrone Acetate; Ferrous fumarate tablets or capsules What is this medicine? ETHINYL ESTRADIOL; NORETHINDRONE ACETATE; FERROUS FUMARATE (ETH in il es tra DYE ole; nor eth IN drone AS e tate; FER us FUE ma rate) is an oral contraceptive. The products combine two types of female hormones, an estrogen and a progestin. They are used to prevent ovulation and pregnancy. Some products are also used to treat acne in females. This medicine may be used for other purposes; ask your health care provider or pharmacist if you have questions. COMMON BRAND NAME(S): Blisovi [address]24 Fe, Blisovi Fe, Estrostep Fe, Gildess [address]24 Fe, Gildess Fe 1.5/30, Gildess Fe 1/20, Junel Fe 1.5/30, Junel Fe 1/20, Junel Fe 24, Larin Fe, Lo Loestrin Fe, Loestrin 24 Fe, Loestrin FE 1.5/30, Loestrin FE 1/20, Lomedia 24 Fe, Microgestin 24 Fe, Microgestin Fe 1.5/30, Microgestin Fe 1/20, Tarina Fe 1/20, Taytulla, Tilia Fe, Tri-Legest Fe What should I tell my health care provider before I take this medicine? They need to know if you have any of these conditions: -abnormal vaginal bleeding -blood vessel disease -breast, cervical, endometrial, ovarian, liver, or uterine cancer -diabetes -gallbladder disease -heart disease or recent heart attack -high blood pressure -high cholesterol -history of blood clots -kidney disease -liver disease -migraine headaches -smoke tobacco -stroke -systemic lupus erythematosus (SLE) -an unusual or allergic reaction to estrogens, progestins, other medicines, foods, dyes, or preservatives -pregnant or trying to get pregnant -breast-feeding How should I use this medicine? Take this medicine by mouth. To reduce nausea, this medicine may be taken with food. Follow the directions on the prescription label. Take this medicine at the same time each day and in the order directed on  the package. Do not take your medicine more often than directed. A patient package insert for the product will be given with each prescription and refill. Read this sheet carefully each time. The sheet may change frequently. Contact your pediatrician regarding the use of this medicine in children. Special care may be needed. This medicine has been used in female children who have started having menstrual periods. Overdosage: If you think you have taken too much of this medicine contact a poison control center or emergency room at once. NOTE: This medicine is only for you. Do not share this medicine with others. What if I miss a dose? If you miss a dose, refer to the patient information sheet you received with your medicine for direction. If you miss more than one pill, this medicine may not be as effective and you may need to use another form of birth control. What may interact with this medicine? Do not take this medicine with the following medication: -dasabuvir; ombitasvir; paritaprevir; ritonavir -ombitasvir; paritaprevir; ritonavir This medicine may also interact with the following medications: -acetaminophen -antibiotics or medicines for infections, especially rifampin, rifabutin, rifapentine, and griseofulvin, and possibly penicillins or tetracyclines -  aprepitant -ascorbic acid (vitamin C) -atorvastatin -barbiturate medicines, such as phenobarbital -bosentan -carbamazepine -caffeine -clofibrate -cyclosporine -dantrolene -doxercalciferol -felbamate -grapefruit juice -hydrocortisone -medicines for anxiety or sleeping problems, such as diazepam or temazepam -medicines for diabetes, including pioglitazone -mineral oil -modafinil -mycophenolate -nefazodone -oxcarbazepine -phenytoin -prednisolone -ritonavir or other medicines for HIV infection or AIDS -rosuvastatin -selegiline -soy isoflavones supplements -St. John's wort -tamoxifen or raloxifene -theophylline -thyroid  hormones -topiramate -warfarin This list may not describe all possible interactions. Give your health care provider a list of all the medicines, herbs, non-prescription drugs, or dietary supplements you use. Also tell them if you smoke, drink alcohol, or use illegal drugs. Some items may interact with your medicine. What should I watch for while using this medicine? Visit your doctor or health care professional for regular checks on your progress. You will need a regular breast and pelvic exam and Pap smear while on this medicine. Use an additional method of contraception during the first cycle that you take these tablets. If you have any reason to think you are pregnant, stop taking this medicine right away and contact your doctor or health care professional. If you are taking this medicine for hormone related problems, it may take several cycles of use to see improvement in your condition. Smoking increases the risk of getting a blood clot or having a stroke while you are taking birth control pills, especially if you are more than 30 years old. You are strongly advised not to smoke. This medicine can make your body retain fluid, making your fingers, hands, or ankles swell. Your blood pressure can go up. Contact your doctor or health care professional if you feel you are retaining fluid. This medicine can make you more sensitive to the sun. Keep out of the sun. If you cannot avoid being in the sun, wear protective clothing and use sunscreen. Do not use sun lamps or tanning beds/booths. If you wear contact lenses and notice visual changes, or if the lenses begin to feel uncomfortable, consult your eye care specialist. In some women, tenderness, swelling, or minor bleeding of the gums may occur. Notify your dentist if this happens. Brushing and flossing your teeth regularly may help limit this. See your dentist regularly and inform your dentist of the medicines you are taking. If you are going to have  elective surgery, you may need to stop taking this medicine before the surgery. Consult your health care professional for advice. This medicine does not protect you against HIV infection (AIDS) or any other sexually transmitted diseases. What side effects may I notice from receiving this medicine? Side effects that you should report to your doctor or health care professional as soon as possible: -allergic reactions like skin rash, itching or hives, swelling of the face, lips, or tongue -breast tissue changes or discharge -changes in vaginal bleeding during your period or between your periods -changes in vision -chest pain -confusion -coughing up blood -dizziness -feeling faint or lightheaded -headaches or migraines -leg, arm or groin pain -loss of balance or coordination -severe or sudden headaches -stomach pain (severe) -sudden shortness of breath -sudden numbness or weakness of the face, arm or leg -symptoms of vaginal infection like itching, irritation or unusual discharge -tenderness in the upper abdomen -trouble speaking or understanding -vomiting -yellowing of the eyes or skin Side effects that usually do not require medical attention (report to your doctor or health care professional if they continue or are bothersome): -breakthrough bleeding and spotting that continues beyond the 3 initial cycles  of pills -breast tenderness -mood changes, anxiety, depression, frustration, anger, or emotional outbursts -increased sensitivity to sun or ultraviolet light -nausea -skin rash, acne, or brown spots on the skin -weight gain (slight) This list may not describe all possible side effects. Call your doctor for medical advice about side effects. You may report side effects to FDA at 1-800-FDA-1088. Where should I keep my medicine? Keep out of the reach of children. Store at room temperature between 15 and 30 degrees C (59 and 86 degrees F). Throw away any unused medicine after the  expiration date. NOTE: This sheet is a summary. It may not cover all possible information. If you have questions about this medicine, talk to your doctor, pharmacist, or health care provider.  2018 Elsevier/Gold Standard (2016-08-17 08:04:41)  

## 2018-07-21 NOTE — Progress Notes (Signed)
POSTPARTUM VISIT Patient name: Lindsey FlatteryGina M Seavey MRN [id number]  Date of birth: [birth year] Chief Complaint:   Postpartum Care (bc options)  History of Present Illness:   Lindsey Jensen is a 32 y.o. [id number] Caucasian female being seen today for a postpartum visit. She is 4 weeks postpartum following a spontaneous vaginal delivery at 41.0 gestational weeks. Anesthesia: epidural. I have fully reviewed the prenatal and intrapartum course. Pregnancy uncomplicated. Postpartum course has been complicated by headaches and Lt side/arm pain- states both are improving. Had neg heat CT 07/13/18. Bleeding no bleeding. Bowel function is normal. Bladder function is normal.  Patient is not sexually active. Last sexual activity: prior to birth of baby.  Contraception method is partner has appt for consult for vasectomy 8/13, wants to do pills until he is clear, did start smoking again, and is having aura w/ these headaches- so will do POPs  [place] Postpartum Depression Screening: negative. Score 3.   Last pap 08/31/16.  Results were normal .  No LMP recorded.  Baby's course has been uncomplicated. Baby is feeding by bottle.  Review of Systems:   Pertinent items are noted in HPI Denies Abnormal vaginal discharge w/ itching/odor/irritation, headaches, visual changes, shortness of breath, chest pain, abdominal pain, severe nausea/vomiting, or problems with urination or bowel movements. Pertinent History Reviewed:  Reviewed past medical,surgical, obstetrical and family history.  Reviewed problem list, medications and allergies. OB History  Gravida Para Term Preterm AB Living  1 1 1  0 0 1  SAB TAB Ectopic Multiple Live Births  0 0 0 0 1    # Outcome Date GA Lbr Len/2nd Weight Sex Delivery Anes PTL Lv  1 Term 06/16/18 [id number] 23:37 / 02:56 7 lb 7.6 oz (3.391 kg) M Vag-Spont EPI  LIV   Physical Assessment:   Vitals:   07/21/18 1142  BP: 113/72  Pulse: 84  Weight: 187 lb (84.8 kg)  Height: 5\' 8"  (1.727 m)  Body  mass index is 28.43 kg/m.       Physical Examination:   General appearance: alert, well appearing, and in no distress  Mental status: alert, oriented to person, place, and time  Skin: warm & dry   Cardiovascular: normal heart rate noted   Respiratory: normal respiratory effort, no distress   Breasts: deferred, no complaints   Abdomen: soft, non-tender   Pelvic: VULVA: normal appearing vulva with no masses, tenderness or lesions, UTERUS: uterus is normal size, shape, consistency and nontender  Rectal: no hemorrhoids  Extremities: no edema       No results found for this or any previous visit (from the past 24 hour(s)).  Assessment & Plan:  1) Postpartum exam 2) 4 wks s/p SVB 3) Bottlefeeding 4) Depression screening 5) Contraception counseling, pt prefers oral progesterone-only contraceptive. Rx micronor w/ 11RF, understands has to take at exact same time daily to be effective, if late taking use condom as back-up  6) Improving headaches>never received call from headache clinic, getting ready to start back work and unsure if she would be able to go at this point, and headaches are improving  Meds:  Meds ordered this encounter  Medications  . DISCONTD: LO LOESTRIN FE 1 MG-10 MCG / 10 MCG tablet    Sig: Take 1 tablet by mouth daily.    Dispense:  3 Package    Refill:  3    For co-pay card, pt to text "Lo Loestrin Fe " to [phone]  Co-pay card must be run in second position  "other coverage code 3"  if denied d/t PA, step edit, or insurance denial    Order Specific Question:   Supervising Provider    Answer:   Despina Hidden, LUTHER H [[id number]]  . norethindrone (MICRONOR,CAMILA,ERRIN) 0.35 MG tablet    Sig: Take 1 tablet (0.35 mg total) by mouth daily.    Dispense:  1 Package    Refill:  11    Order Specific Question:   Supervising Provider    Answer:   Lazaro Arms [[id number]]    Follow-up: Return in about 1 year (around 07/22/2019) for Physical.   No orders of the defined types were  placed in this encounter.   Cheral Marker CNM, [facility] 07/21/2018 12:13 PM

## 2018-08-01 ENCOUNTER — Ambulatory Visit: Payer: BLUE CROSS/BLUE SHIELD | Admitting: Obstetrics & Gynecology

## 2018-08-18 ENCOUNTER — Telehealth: Payer: Self-pay | Admitting: *Deleted

## 2018-08-18 NOTE — Telephone Encounter (Signed)
Pt is requesting a note to return to work after postpartum period effective Sept 3. 2019

## 2018-09-05 ENCOUNTER — Other Ambulatory Visit: Payer: Self-pay | Admitting: Women's Health

## 2018-09-16 DIAGNOSIS — J329 Chronic sinusitis, unspecified: Secondary | ICD-10-CM | POA: Diagnosis not present

## 2018-09-16 DIAGNOSIS — J029 Acute pharyngitis, unspecified: Secondary | ICD-10-CM | POA: Diagnosis not present

## 2018-09-16 DIAGNOSIS — J111 Influenza due to unidentified influenza virus with other respiratory manifestations: Secondary | ICD-10-CM | POA: Diagnosis not present

## 2018-09-19 DIAGNOSIS — Z6827 Body mass index (BMI) 27.0-27.9, adult: Secondary | ICD-10-CM | POA: Diagnosis not present

## 2018-09-19 DIAGNOSIS — Z0000 Encounter for general adult medical examination without abnormal findings: Secondary | ICD-10-CM | POA: Diagnosis not present

## 2018-09-19 DIAGNOSIS — E663 Overweight: Secondary | ICD-10-CM | POA: Diagnosis not present

## 2018-09-19 DIAGNOSIS — Z1389 Encounter for screening for other disorder: Secondary | ICD-10-CM | POA: Diagnosis not present

## 2018-09-19 DIAGNOSIS — Z719 Counseling, unspecified: Secondary | ICD-10-CM | POA: Diagnosis not present

## 2018-10-01 ENCOUNTER — Emergency Department (HOSPITAL_COMMUNITY): Payer: BLUE CROSS/BLUE SHIELD

## 2018-10-01 ENCOUNTER — Encounter (HOSPITAL_COMMUNITY): Payer: Self-pay | Admitting: Emergency Medicine

## 2018-10-01 ENCOUNTER — Other Ambulatory Visit: Payer: Self-pay

## 2018-10-01 ENCOUNTER — Emergency Department (HOSPITAL_COMMUNITY)
Admission: EM | Admit: 2018-10-01 | Discharge: 2018-10-01 | Disposition: A | Discharge status: Home / Self Care | Payer: BLUE CROSS/BLUE SHIELD | Attending: Emergency Medicine | Admitting: Emergency Medicine

## 2018-10-01 DIAGNOSIS — J40 Bronchitis, not specified as acute or chronic: Secondary | ICD-10-CM | POA: Diagnosis not present

## 2018-10-01 DIAGNOSIS — R079 Chest pain, unspecified: Secondary | ICD-10-CM | POA: Diagnosis not present

## 2018-10-01 DIAGNOSIS — Z79899 Other long term (current) drug therapy: Secondary | ICD-10-CM | POA: Insufficient documentation

## 2018-10-01 DIAGNOSIS — R0602 Shortness of breath: Secondary | ICD-10-CM | POA: Insufficient documentation

## 2018-10-01 DIAGNOSIS — F17210 Nicotine dependence, cigarettes, uncomplicated: Secondary | ICD-10-CM | POA: Insufficient documentation

## 2018-10-01 DIAGNOSIS — J329 Chronic sinusitis, unspecified: Secondary | ICD-10-CM | POA: Diagnosis not present

## 2018-10-01 DIAGNOSIS — R05 Cough: Secondary | ICD-10-CM | POA: Diagnosis not present

## 2018-10-01 DIAGNOSIS — R0789 Other chest pain: Secondary | ICD-10-CM | POA: Diagnosis not present

## 2018-10-01 LAB — BASIC METABOLIC PANEL
Anion gap: 5 (ref 5–15)
BUN: 8 mg/dL (ref 6–20)
CHLORIDE: 102 mmol/L (ref 98–111)
CO2: 30 mmol/L (ref 22–32)
Calcium: 9.2 mg/dL (ref 8.9–10.3)
Creatinine, Ser: 0.63 mg/dL (ref 0.44–1.00)
GFR calc Af Amer: >60 mL/min (ref >60)
GFR calc non Af Amer: >60 mL/min (ref >60)
GLUCOSE: 103 mg/dL — AB (ref 70–99)
Potassium: 3.6 mmol/L (ref 3.5–5.1)
SODIUM: 137 mmol/L (ref 135–145)

## 2018-10-01 LAB — D-DIMER, QUANTITATIVE: D-Dimer, Quant: <0.27 ug/mL-FEU (ref 0.00–0.50)

## 2018-10-01 MED ORDER — PREDNISONE 20 MG PO TABS
40.0000 mg | ORAL_TABLET | Freq: Once | ORAL | Status: AC
  Administered 2018-10-01: 40 mg via ORAL
  Filled 2018-10-01: qty 2

## 2018-10-01 MED ORDER — ACETAMINOPHEN 500 MG PO TABS: 1000.0000 mg | ORAL_TABLET | Freq: Once | ORAL | Status: DC

## 2018-10-01 MED ORDER — DEXAMETHASONE 4 MG PO TABS: 4.0000 mg | ORAL_TABLET | Freq: Two times a day (BID) | ORAL | 0 refills | Status: DC

## 2018-10-01 MED ORDER — OXYMETAZOLINE HCL 0.05 % NA SOLN
1.0000 | Freq: Once | NASAL | Status: AC
  Administered 2018-10-01: 1 via NASAL
  Filled 2018-10-01: qty 15

## 2018-10-01 MED ORDER — IBUPROFEN 800 MG PO TABS: 800.0000 mg | ORAL_TABLET | Freq: Once | ORAL | Status: DC

## 2018-10-01 MED ORDER — ALBUTEROL SULFATE HFA 108 (90 BASE) MCG/ACT IN AERS
2.0000 | INHALATION_SPRAY | Freq: Once | RESPIRATORY_TRACT | Status: AC
  Administered 2018-10-01: 2 via RESPIRATORY_TRACT
  Filled 2018-10-01: qty 6.7

## 2018-10-01 MED ORDER — DEXTROMETHORPHAN POLISTIREX ER 30 MG/5ML PO SUER: 30.0000 mg | Freq: Two times a day (BID) | ORAL | 0 refills | Status: DC

## 2018-10-01 NOTE — ED Notes (Signed)
Pt smokes 1PPD  Cough and congestion x 1 week  Wet cough

## 2018-10-01 NOTE — ED Notes (Signed)
Labs in process.

## 2018-10-01 NOTE — Discharge Instructions (Addendum)
Your oxygen level is 97% on room air.  Your blood pressure was 137/94, please have this rechecked after your upper respiratory infection has improved.  Please use Afrin every 8 hours for 5 days only.  You may resume it after a 5-day rest.  If needed.  Please use 2 puffs of albuterol every 4 hours as needed for difficulty breathing or cough.  Use Decadron and delsym 2 times daily.  Please wash hands frequently.  Please increase fluids.  Follow-up with your primary physician or return to the emergency department if not improving.

## 2018-10-01 NOTE — ED Notes (Signed)
Pt complaint of heart racing Chest tightness  HB in to reassess and order labs

## 2018-10-01 NOTE — ED Provider Notes (Signed)
[facility] EMERGENCY DEPARTMENT Provider Note   CSN: [id number] Arrival date & time: 10/01/18  1825     History   Chief Complaint Chief Complaint  Patient presents with  . Cough    HPI Lindsey Jensen is a 32 y.o. female.  The history is provided by the patient.  Cough  This is a new problem. The current episode started more than 1 week ago. The problem occurs hourly. The problem has been gradually worsening. The cough is productive of sputum. There has been no fever. Associated symptoms include myalgias and shortness of breath. Pertinent negatives include no chest pain, no sweats and no wheezing. Treatments tried: tylenol. The treatment provided no relief. She is a smoker. Her past medical history is significant for bronchitis. Her past medical history does not include pneumonia.    Past Medical History:  Diagnosis Date  . Contraceptive education 01/26/2014  . Medical history non-contributory     Patient Active Problem List   Diagnosis Date Noted  . Smoker 10/07/2017  . Generalized anxiety disorder 08/23/2013    Past Surgical History:  Procedure Laterality Date  . NO PAST SURGERIES       OB History    Gravida  1   Para  1   Term  1   Preterm  0   AB  0   Living  1     SAB  0   TAB  0   Ectopic  0   Multiple  0   Live Births  1            Home Medications    Prior to Admission medications   Medication Sig Start Date End Date Taking? Authorizing Provider  acetaminophen (TYLENOL) 500 MG tablet Take 500 mg by mouth every 6 (six) hours as needed for mild pain or headache.    [provider]  clindamycin (CLINDAGEL) 1 % gel Apply topically 2 (two) times daily. Patient not taking: Reported on 07/11/2018 10/21/17   Cresenzo-Dishmon, Scarlette Calico, CNM  ibuprofen (ADVIL,MOTRIN) 600 MG tablet Take 1 tablet (600 mg total) by mouth every 6 (six) hours. Patient not taking: Reported on 07/21/2018 06/18/18   Leftwich-Kirby, Wilmer Floor, CNM  norethindrone  (MICRONOR,CAMILA,ERRIN) 0.35 MG tablet TAKE 1 TABLET BY MOUTH EVERY DAY 09/05/18   Cheral Marker, CNM  Prenat-FeAsp-Meth-FA-DHA w/o A (PRENATE PIXIE) 10-0.6-0.4-200 MG CAPS Take 1 capsule by mouth daily. Patient not taking: Reported on 07/11/2018 10/07/17   Cheral Marker, CNM    Family History Family History  Problem Relation Age of Onset  . Heart disease Father        heart attack  . Diabetes Mother   . Depression Mother   . Anxiety disorder Mother   . Heart disease Mother   . Heart disease Maternal Grandmother        CHF  . Alzheimer's disease Maternal Grandfather     Social History Social History   Tobacco Use  . Smoking status: Current Every Day Smoker    Packs/day: 1.00    Types: Cigarettes    Last attempt to quit: 05/05/2017    Years since quitting: 1.4  . Smokeless tobacco: Never Used  Substance Use Topics  . Alcohol use: No    Alcohol/week: 14.0 standard drinks    Types: 14 Glasses of wine per week    Frequency: Never    Comment: occ  . Drug use: No     Allergies   Patient has no known allergies.  Review of Systems Review of Systems  Constitutional: Negative for activity change.       All ROS Neg except as noted in HPI  HENT: Positive for congestion. Negative for nosebleeds.   Eyes: Negative for photophobia and discharge.  Respiratory: Positive for cough and shortness of breath. Negative for wheezing.   Cardiovascular: Negative for chest pain and palpitations.  Gastrointestinal: Negative for abdominal pain and blood in stool.  Genitourinary: Negative for dysuria, frequency and hematuria.  Musculoskeletal: Positive for myalgias. Negative for arthralgias, back pain and neck pain.  Skin: Negative.   Neurological: Negative for dizziness, seizures and speech difficulty.  Psychiatric/Behavioral: Negative for confusion and hallucinations.     Physical Exam Updated Vital Signs BP (!) 137/94 (BP Location: Right Arm)   Pulse 100   Temp 97.9 F  (36.6 C) (Oral)   Resp 18   Ht 5\' 8"  (1.727 m)   Wt 81.2 kg   LMP 10/01/2018   SpO2 97%   BMI 27.22 kg/m   Physical Exam  Constitutional: She is oriented to person, place, and time. She appears well-developed and well-nourished.  Non-toxic appearance.  HENT:  Head: Normocephalic.  Right Ear: Tympanic membrane and external ear normal.  Left Ear: Tympanic membrane and external ear normal.  Nasal congestion present.  Airway patent.  Uvula is in the midline.  Uvula slightly swollen.  Speech is clear.  Eyes: Pupils are equal, round, and reactive to light. EOM and lids are normal.  Neck: Normal range of motion. Neck supple. Carotid bruit is not present.  Cardiovascular: Regular rhythm, normal heart sounds, intact distal pulses and normal pulses. Tachycardia present.  Heart rate 100 by auscultation.  Pulmonary/Chest: Breath sounds normal. No stridor. No respiratory distress. She has no wheezes. She has no rales.  Coarse breath sounds present.  There is symmetrical rise and fall of the chest.  There is no stridor noted.  Abdominal: Soft. Bowel sounds are normal. There is no tenderness. There is no guarding.  Musculoskeletal: Normal range of motion.  Negative Homans sign  Lymphadenopathy:       Head (right side): No submandibular adenopathy present.       Head (left side): No submandibular adenopathy present.    She has no cervical adenopathy.  Neurological: She is alert and oriented to person, place, and time. She has normal strength. No cranial nerve deficit or sensory deficit. Coordination normal.  Skin: Skin is warm and dry.  Psychiatric: She has a normal mood and affect. Her speech is normal.  Nursing note and vitals reviewed.    ED Treatments / Results  Labs (all labs ordered are listed, but only abnormal results are displayed) Labs Reviewed - No data to display  EKG None  Radiology Dg Chest 2 View  Result Date: 10/01/2018 CLINICAL DATA:  Productive cough, chest pain,  and shortness of breath. EXAM: CHEST - 2 VIEW COMPARISON:  07/31/2013 FINDINGS: The heart size and mediastinal contours are within normal limits. Both lungs are clear. The visualized skeletal structures are unremarkable. IMPRESSION: Negative.  No active cardiopulmonary disease. Electronically Signed   By: Myles Rosenthal M.D.   On: 10/01/2018 19:23    Procedures Procedures (including critical care time)  Medications Ordered in ED Medications - No data to display   Initial Impression / Assessment and Plan / ED Course  I have reviewed the triage vital signs and the nursing notes.  Pertinent labs & imaging results that were available during my care of the patient were reviewed  by me and considered in my medical decision making (see chart for details).       Final Clinical Impressions(s) / ED Diagnoses MDM  Vital signs reviewed.  Pulse oximetry is 97% on room air.  Within normal limits by my interpretation.  The examination shows nasal congestion and signs of postnasal drip.  The lung exam shows coarse breath sounds, but symmetrical rise and fall of the chest.  The patient speaks in complete sentences without problem.  The chest x-ray is read as negative by the radiologist.  I discussed with the patient that the symptoms are probably related to sinusitis and bronchitis.  Patient has no previous cardiac or lung related illness.  Patient states that over this past week she is felt as though her heart was racing at times.  There is been no loss of consciousness, and this has not prevented her from participating in her activities of daily living. Recheck.  Heart rate is 88, patient states she still feels as though there is some pressure on her chest and under her ribs.  Given that the patient recently gave birth, will obtain a d-dimer test.  D-dimer is negative for acute problem.  Basic metabolic panel also within normal limits.  I have informed the patient of the test.  The patient is feeling some  better.  Will use Tylenol every 4 hours or ibuprofen every 6 hours for soreness in the chest.  The patient will also use Decadron and Delsym 2 times daily.  Patient was given an albuterol inhaler 2 puffs every 4 hours for difficulty with breathing or cough.  The patient is to follow-up with a primary physician or return to the emergency department if any changes in condition, problems, or concerns.   Final diagnoses:  Bronchitis  Other sinusitis, unspecified chronicity    ED Discharge Orders         Ordered    dextromethorphan (DELSYM) 30 MG/5ML liquid  2 times daily     10/01/18 1959    dexamethasone (DECADRON) 4 MG tablet  2 times daily with meals     10/01/18 1959           Ivery Quale, PA-C 10/01/18 2134    Bethann Berkshire, MD 10/01/18 2247

## 2018-10-01 NOTE — ED Triage Notes (Signed)
Cough/congestion for one week. Denies fever.

## 2018-10-06 ENCOUNTER — Other Ambulatory Visit: Payer: Self-pay

## 2018-10-06 ENCOUNTER — Encounter (HOSPITAL_COMMUNITY): Payer: Self-pay | Admitting: *Deleted

## 2018-10-06 ENCOUNTER — Emergency Department (HOSPITAL_COMMUNITY)
Admission: EM | Admit: 2018-10-06 | Discharge: 2018-10-07 | Disposition: A | Discharge status: Home / Self Care | Payer: BLUE CROSS/BLUE SHIELD | Attending: Emergency Medicine | Admitting: Emergency Medicine

## 2018-10-06 ENCOUNTER — Emergency Department (HOSPITAL_COMMUNITY): Payer: BLUE CROSS/BLUE SHIELD

## 2018-10-06 DIAGNOSIS — R05 Cough: Secondary | ICD-10-CM | POA: Diagnosis not present

## 2018-10-06 DIAGNOSIS — J209 Acute bronchitis, unspecified: Secondary | ICD-10-CM | POA: Diagnosis not present

## 2018-10-06 DIAGNOSIS — R0602 Shortness of breath: Secondary | ICD-10-CM | POA: Diagnosis not present

## 2018-10-06 DIAGNOSIS — R079 Chest pain, unspecified: Secondary | ICD-10-CM | POA: Diagnosis not present

## 2018-10-06 DIAGNOSIS — F17210 Nicotine dependence, cigarettes, uncomplicated: Secondary | ICD-10-CM | POA: Insufficient documentation

## 2018-10-06 DIAGNOSIS — Z79899 Other long term (current) drug therapy: Secondary | ICD-10-CM | POA: Diagnosis not present

## 2018-10-06 NOTE — ED Provider Notes (Signed)
[facility] EMERGENCY DEPARTMENT Provider Note   CSN: [id number] Arrival date & time: 10/06/18  2203     History   Chief Complaint Chief Complaint  Patient presents with  . Cough    HPI Lindsey Jensen is a 32 y.o. female.  The history is provided by the patient.  She has history of generalized anxiety disorder, and comes in because of ongoing nonproductive cough.  She was seen here 5 days ago for cough and diagnosed with bronchitis, discharged with an albuterol inhaler and prescription for dexamethasone.  She has not noticed any improvement with those.  Symptoms started about 1 week prior to that.  She denies fever or chills.  She has some soreness in her chest, but no true chest pain.  There is no arthralgias or myalgias.  She denies dyspnea.  She is just very frustrated over inability to bring the cough under control.  She is a cigarette smoker, but states that she has barely smoked at all since she got sick.  Also, she states that about 1 month ago, she was treated for strep throat with azithromycin.  Past Medical History:  Diagnosis Date  . Contraceptive education 01/26/2014  . Medical history non-contributory     Patient Active Problem List   Diagnosis Date Noted  . Smoker 10/07/2017  . Generalized anxiety disorder 08/23/2013    Past Surgical History:  Procedure Laterality Date  . NO PAST SURGERIES       OB History    Gravida  1   Para  1   Term  1   Preterm  0   AB  0   Living  1     SAB  0   TAB  0   Ectopic  0   Multiple  0   Live Births  1            Home Medications    Prior to Admission medications   Medication Sig Start Date End Date Taking? Authorizing Provider  acetaminophen (TYLENOL) 500 MG tablet Take 500 mg by mouth every 6 (six) hours as needed for mild pain or headache.    [provider]  clindamycin (CLINDAGEL) 1 % gel Apply topically 2 (two) times daily. Patient not taking: Reported on 07/11/2018 10/21/17    Cresenzo-Dishmon, Scarlette Calico, CNM  dexamethasone (DECADRON) 4 MG tablet Take 1 tablet (4 mg total) by mouth 2 (two) times daily with a meal. 10/01/18   Ivery Quale, PA-C  dextromethorphan (DELSYM) 30 MG/5ML liquid Take 5 mLs (30 mg total) by mouth 2 (two) times daily. 10/01/18   Ivery Quale, PA-C  ibuprofen (ADVIL,MOTRIN) 600 MG tablet Take 1 tablet (600 mg total) by mouth every 6 (six) hours. Patient not taking: Reported on 07/21/2018 06/18/18   Leftwich-Kirby, Wilmer Floor, CNM  norethindrone (MICRONOR,CAMILA,ERRIN) 0.35 MG tablet TAKE 1 TABLET BY MOUTH EVERY DAY 09/05/18   Cheral Marker, CNM  Prenat-FeAsp-Meth-FA-DHA w/o A (PRENATE PIXIE) 10-0.6-0.4-200 MG CAPS Take 1 capsule by mouth daily. Patient not taking: Reported on 07/11/2018 10/07/17   Cheral Marker, CNM    Family History Family History  Problem Relation Age of Onset  . Heart disease Father        heart attack  . Diabetes Mother   . Depression Mother   . Anxiety disorder Mother   . Heart disease Mother   . Heart disease Maternal Grandmother        CHF  . Alzheimer's disease Maternal Grandfather  Social History Social History   Tobacco Use  . Smoking status: Current Every Day Smoker    Packs/day: 1.00    Types: Cigarettes    Last attempt to quit: 05/05/2017    Years since quitting: 1.4  . Smokeless tobacco: Never Used  Substance Use Topics  . Alcohol use: No    Alcohol/week: 14.0 standard drinks    Types: 14 Glasses of wine per week    Frequency: Never    Comment: occ  . Drug use: No     Allergies   Patient has no known allergies.   Review of Systems Review of Systems  All other systems reviewed and are negative.    Physical Exam Updated Vital Signs BP 138/86 (BP Location: Right Arm)   Pulse 69   Temp 98 F (36.7 C) (Oral)   Resp 15   LMP 10/01/2018   SpO2 98%   Physical Exam  Nursing note and vitals reviewed.  32 year old female, resting comfortably and in no acute distress. Vital  signs are normal. Oxygen saturation is 98%, which is normal. Head is normocephalic and atraumatic. PERRLA, EOMI. Oropharynx is clear. Neck is nontender and supple without adenopathy or JVD. Back is nontender and there is no CVA tenderness. Lungs are clear without rales, wheezes, or rhonchi.  Harsh cough is noted without any wheezy component. Chest is nontender. Heart has regular rate and rhythm without murmur. Abdomen is soft, flat, nontender without masses or hepatosplenomegaly and peristalsis is normoactive. Extremities have no cyanosis or edema, full range of motion is present. Skin is warm and dry without rash. Neurologic: Mental status is normal, cranial nerves are intact, there are no motor or sensory deficits.  ED Treatments / Results   Radiology Dg Chest 2 View  Result Date: 10/06/2018 CLINICAL DATA:  Dry cough and shortness of breath over the last 2 weeks. Some chest pain. EXAM: CHEST - 2 VIEW COMPARISON:  10/01/2018 FINDINGS: Heart and mediastinum are normal. Question mild bronchial thickening. No infiltrate, collapse or effusion. Bony structures are unremarkable. IMPRESSION: Probable mild bronchitis pattern.  No consolidation or collapse. Electronically Signed   By: Paulina Fusi M.D.   On: 10/06/2018 22:37    Procedures Procedures  Medications Ordered in ED Medications - No data to display   Initial Impression / Assessment and Plan / ED Course  I have reviewed the triage vital signs and the nursing notes.  Pertinent imaging results that were available during my care of the patient were reviewed by me and considered in my medical decision making (see chart for details).  Persistent cough.  Chest x-ray shows no evidence of pneumonia.  However, given persistence of symptoms and failure to respond to steroids and beta agonist, will give her a course of antibiotics.  She relates possible history of penicillin allergy, so is discharged with prescription for cefuroxime as well as  benzonatate.  Given a take-home pack of hydrocodone-acetaminophen to use to help suppress cough so she can sleep.  She is also concerned about possible yeast infection, given prescription for fluconazole to take after completing course of antibiotics.  Return precautions discussed.  All records are reviewed, confirming recent ED visit for cough.  Final Clinical Impressions(s) / ED Diagnoses   Final diagnoses:  Acute bronchitis, unspecified organism    ED Discharge Orders    None       Dione Booze, MD 10/07/18 [phone]

## 2018-10-06 NOTE — ED Triage Notes (Signed)
Pt c/o dry cough over the last week; pt states she was seen here for same complaint x one week ago and the meds she was prescribed have not helped

## 2018-10-07 DIAGNOSIS — J209 Acute bronchitis, unspecified: Secondary | ICD-10-CM | POA: Diagnosis not present

## 2018-10-07 DIAGNOSIS — Z6827 Body mass index (BMI) 27.0-27.9, adult: Secondary | ICD-10-CM | POA: Diagnosis not present

## 2018-10-07 DIAGNOSIS — R002 Palpitations: Secondary | ICD-10-CM | POA: Diagnosis not present

## 2018-10-07 DIAGNOSIS — Z1389 Encounter for screening for other disorder: Secondary | ICD-10-CM | POA: Diagnosis not present

## 2018-10-07 DIAGNOSIS — E663 Overweight: Secondary | ICD-10-CM | POA: Diagnosis not present

## 2018-10-07 MED ORDER — FLUCONAZOLE 150 MG PO TABS: 150.0000 mg | ORAL_TABLET | Freq: Once | ORAL | 0 refills | Status: AC

## 2018-10-07 MED ORDER — HYDROCODONE-ACETAMINOPHEN 5-325 MG PO TABS: 1.0000 | ORAL_TABLET | ORAL | 0 refills | Status: DC | PRN

## 2018-10-07 MED ORDER — CEFUROXIME AXETIL 500 MG PO TABS: 500.0000 mg | ORAL_TABLET | Freq: Two times a day (BID) | ORAL | 0 refills | Status: DC

## 2018-10-07 MED ORDER — BENZONATATE 100 MG PO CAPS: 100.0000 mg | ORAL_CAPSULE | Freq: Three times a day (TID) | ORAL | 0 refills | Status: DC

## 2018-10-07 NOTE — Discharge Instructions (Signed)
Continue to drink plenty of fluids. You may continue to use the over-the-counter cough medications. Use the hydrocodone-acetaminophen as needed to suppress the cough at night, so you can sleep. Return if you start running a fever or if you start getting more short of breath.

## 2018-10-11 MED FILL — Hydrocodone-Acetaminophen Tab 5-325 MG: ORAL | Qty: 6 | Status: AC

## 2018-10-17 DIAGNOSIS — Z0000 Encounter for general adult medical examination without abnormal findings: Secondary | ICD-10-CM | POA: Diagnosis not present

## 2018-10-17 DIAGNOSIS — R05 Cough: Secondary | ICD-10-CM | POA: Diagnosis not present

## 2018-10-17 DIAGNOSIS — R000 Tachycardia, unspecified: Secondary | ICD-10-CM | POA: Diagnosis not present

## 2018-10-17 DIAGNOSIS — E663 Overweight: Secondary | ICD-10-CM | POA: Diagnosis not present

## 2018-10-17 DIAGNOSIS — R739 Hyperglycemia, unspecified: Secondary | ICD-10-CM | POA: Diagnosis not present

## 2018-10-17 DIAGNOSIS — Z1389 Encounter for screening for other disorder: Secondary | ICD-10-CM | POA: Diagnosis not present

## 2018-10-17 DIAGNOSIS — J209 Acute bronchitis, unspecified: Secondary | ICD-10-CM | POA: Diagnosis not present

## 2018-10-17 DIAGNOSIS — Z6827 Body mass index (BMI) 27.0-27.9, adult: Secondary | ICD-10-CM | POA: Diagnosis not present

## 2018-10-18 ENCOUNTER — Emergency Department (HOSPITAL_COMMUNITY)
Admission: EM | Admit: 2018-10-18 | Discharge: 2018-10-18 | Disposition: A | Discharge status: Home / Self Care | Payer: BLUE CROSS/BLUE SHIELD | Attending: Emergency Medicine | Admitting: Emergency Medicine

## 2018-10-18 ENCOUNTER — Emergency Department (HOSPITAL_COMMUNITY): Payer: BLUE CROSS/BLUE SHIELD

## 2018-10-18 ENCOUNTER — Encounter (HOSPITAL_COMMUNITY): Payer: Self-pay | Admitting: Emergency Medicine

## 2018-10-18 DIAGNOSIS — F17210 Nicotine dependence, cigarettes, uncomplicated: Secondary | ICD-10-CM | POA: Insufficient documentation

## 2018-10-18 DIAGNOSIS — R05 Cough: Secondary | ICD-10-CM | POA: Diagnosis not present

## 2018-10-18 DIAGNOSIS — Z79899 Other long term (current) drug therapy: Secondary | ICD-10-CM | POA: Insufficient documentation

## 2018-10-18 DIAGNOSIS — R079 Chest pain, unspecified: Secondary | ICD-10-CM | POA: Diagnosis not present

## 2018-10-18 DIAGNOSIS — R059 Cough, unspecified: Secondary | ICD-10-CM

## 2018-10-18 LAB — I-STAT TROPONIN, ED: TROPONIN I, POC: 0 ng/mL (ref 0.00–0.08)

## 2018-10-18 LAB — I-STAT BETA HCG BLOOD, ED (MC, WL, AP ONLY): HCG, QUANTITATIVE: 11.8 m[IU]/mL — AB (ref <5)

## 2018-10-18 LAB — BASIC METABOLIC PANEL
Anion gap: 9 (ref 5–15)
BUN: 5 mg/dL — ABNORMAL LOW (ref 6–20)
CHLORIDE: 107 mmol/L (ref 98–111)
CO2: 24 mmol/L (ref 22–32)
CREATININE: 0.65 mg/dL (ref 0.44–1.00)
Calcium: 9.5 mg/dL (ref 8.9–10.3)
GFR calc non Af Amer: >60 mL/min (ref >60)
Glucose, Bld: 95 mg/dL (ref 70–99)
Potassium: 4.6 mmol/L (ref 3.5–5.1)
SODIUM: 140 mmol/L (ref 135–145)

## 2018-10-18 LAB — CBC
HEMATOCRIT: 53.3 % — AB (ref 36.0–46.0)
Hemoglobin: 16.7 g/dL — ABNORMAL HIGH (ref 12.0–15.0)
MCH: 28.3 pg (ref 26.0–34.0)
MCHC: 31.3 g/dL (ref 30.0–36.0)
MCV: 90.2 fL (ref 80.0–100.0)
NRBC: 0 % (ref 0.0–0.2)
Platelets: 187 10*3/uL (ref 150–400)
RBC: 5.91 MIL/uL — ABNORMAL HIGH (ref 3.87–5.11)
RDW: 16 % — AB (ref 11.5–15.5)
WBC: 13.2 10*3/uL — AB (ref 4.0–10.5)

## 2018-10-18 LAB — POC URINE PREG, ED: Preg Test, Ur: NEGATIVE

## 2018-10-18 NOTE — ED Triage Notes (Signed)
Pt presents to ED for assessment of GAD and ongoing nonproductive cough x 1 month.  States all of her symptoms started when she was exposed to a cat, which she is allergic to.  Hx of bronchitis, but states this does not feel the same.  She's been seen at [facility] twice with no resolution of her symptoms, and worsening of her chest tightness/discomfort.  C/o shortness of breath when she gets anxious over the symptoms and non-relief.   She is a cigarette smoker, but states that she has barely smoked at all since she got sick.  C/o light-headedness, c/o arm pain, c/o back pain, c/o gagging after coughin, but denies true nausea.

## 2018-10-18 NOTE — ED Provider Notes (Signed)
[facility] EMERGENCY DEPARTMENT Provider Note   CSN: [id number] Arrival date & time: 10/18/18  1304     History   Chief Complaint Chief Complaint  Patient presents with  . Chest Pain    HPI Lindsey Jensen is a 32 y.o. female.  HPI    32 year old female presents today with complaints of cough.  Patient has approximately 3-week history of cough.  She notes this started after going into a house that had a cat in it, she notes she is allergic to cats.  She notes difficulty breathing while in the house but symptoms improved after she left.  She notes several days later she developed cough adductive with sputum.  She notes she has been seen 2 times in the emergency room and by her primary care provider.  She is been placed on antibiotics, cough medication, she is had 3 chest x-rays all which have been negative, also had negative d-dimer.  Patient has a follow-up appointment with pulmonology tomorrow.   Past Medical History:  Diagnosis Date  . Contraceptive education 01/26/2014  . Medical history non-contributory     Patient Active Problem List   Diagnosis Date Noted  . Smoker 10/07/2017  . Generalized anxiety disorder 08/23/2013    Past Surgical History:  Procedure Laterality Date  . NO PAST SURGERIES       OB History    Gravida  1   Para  1   Term  1   Preterm  0   AB  0   Living  1     SAB  0   TAB  0   Ectopic  0   Multiple  0   Live Births  1            Home Medications    Prior to Admission medications   Medication Sig Start Date End Date Taking? Authorizing Provider  acetaminophen (TYLENOL) 500 MG tablet Take 500 mg by mouth every 6 (six) hours as needed for mild pain or headache.    [provider]  benzonatate (TESSALON) 100 MG capsule Take 1 capsule (100 mg total) by mouth every 8 (eight) hours. 10/07/18   Dione Booze, MD  cefUROXime (CEFTIN) 500 MG tablet Take 1 tablet (500 mg total) by mouth 2 (two) times daily  with a meal. 10/07/18   Dione Booze, MD  dextromethorphan (DELSYM) 30 MG/5ML liquid Take 5 mLs (30 mg total) by mouth 2 (two) times daily. 10/01/18   Ivery Quale, PA-C  HYDROcodone-acetaminophen (NORCO) 5-325 MG tablet Take 1 tablet by mouth every 4 (four) hours as needed for moderate pain (or coughing). 10/07/18   Dione Booze, MD  norethindrone (MICRONOR,CAMILA,ERRIN) 0.35 MG tablet TAKE 1 TABLET BY MOUTH EVERY DAY 09/05/18   Cheral Marker, CNM    Family History Family History  Problem Relation Age of Onset  . Heart disease Father        heart attack  . Diabetes Mother   . Depression Mother   . Anxiety disorder Mother   . Heart disease Mother   . Heart disease Maternal Grandmother        CHF  . Alzheimer's disease Maternal Grandfather     Social History Social History   Tobacco Use  . Smoking status: Current Every Day Smoker    Packs/day: 1.00    Types: Cigarettes    Last attempt to quit: 05/05/2017    Years since quitting: 1.4  . Smokeless tobacco: Never Used  Substance Use Topics  . Alcohol use: No    Alcohol/week: 14.0 standard drinks    Types: 14 Glasses of wine per week    Frequency: Never    Comment: occ  . Drug use: No     Allergies   Patient has no known allergies.   Review of Systems Review of Systems  All other systems reviewed and are negative.    Physical Exam Updated Vital Signs BP 120/76 (BP Location: Right Arm)   Pulse 96   Temp 98.4 F (36.9 C) (Oral)   Resp 16   LMP 09/20/2018   SpO2 98%   Physical Exam  Constitutional: She is oriented to person, place, and time. She appears well-developed and well-nourished.  HENT:  Head: Normocephalic and atraumatic.  Eyes: Pupils are equal, round, and reactive to light. Conjunctivae are normal. Right eye exhibits no discharge. Left eye exhibits no discharge. No scleral icterus.  Neck: Normal range of motion. No JVD present. No tracheal deviation present.  Cardiovascular: Normal rate,  regular rhythm, normal heart sounds and intact distal pulses.  Pulmonary/Chest: Effort normal and breath sounds normal. No stridor. No respiratory distress. She has no wheezes. She has no rales.  Dry cough  Neurological: She is alert and oriented to person, place, and time. Coordination normal.  Psychiatric: She has a normal mood and affect. Her behavior is normal. Judgment and thought content normal.  Nursing note and vitals reviewed.    ED Treatments / Results  Labs (all labs ordered are listed, but only abnormal results are displayed) Labs Reviewed  BASIC METABOLIC PANEL - Abnormal; Notable for the following components:      Result Value   BUN 5 (*)    All other components within normal limits  CBC - Abnormal; Notable for the following components:   WBC 13.2 (*)    RBC 5.91 (*)    Hemoglobin 16.7 (*)    HCT 53.3 (*)    RDW 16.0 (*)    All other components within normal limits  I-STAT BETA HCG BLOOD, ED ([facility], [facility], [facility] ONLY) - Abnormal; Notable for the following components:   I-stat hCG, quantitative 11.8 (*)    All other components within normal limits  I-STAT TROPONIN, ED  POC URINE PREG, ED    EKG None   ED EKG, normal sinus rhythm 98/min no ST elevation or depression.  Radiology Dg Chest 2 View  Result Date: 10/18/2018 CLINICAL DATA:  Cough congestion chest pain EXAM: CHEST - 2 VIEW COMPARISON:  10/06/2018 FINDINGS: The heart size and mediastinal contours are within normal limits. Both lungs are clear. The visualized skeletal structures are unremarkable. IMPRESSION: No active cardiopulmonary disease. Electronically Signed   By: Marlan Palau M.D.   On: 10/18/2018 14:44    Procedures Procedures (including critical care time)  Medications Ordered in ED Medications - No data to display   Initial Impression / Assessment and Plan / ED Course  I have reviewed the triage vital signs and the nursing notes.  Pertinent labs & imaging results that were available during my  care of the patient were reviewed by me and considered in my medical decision making (see chart for details).     32 year old female presents today Final Clinical Impressions(s) / ED Diagnoses   Final diagnoses:  Cough     Labs: I-STAT beta-hCG, i-STAT troponin, BMP, CBC  Imaging: DG chest 2 view  Consults:  Therapeutics:  Discharge Meds:   Assessment/Plan:   32 year old female presents today  with cough.  This is likely persistent from previous infection.  She has no signs of infectious etiology, she has had several ED work-ups with no significant findings.  No signs of PE, ACS, no signs of bacterial infection.  Patient is well-appearing.  I have high suspicion for an anxiety component to this.  Patient will follow-up tomorrow with pulmonology, she is given strict return precautions, she verbalized understanding and agreement to today's plan had no further questions or concerns.  ED Discharge Orders    None       Eyvonne Mechanic, Cordelia Poche 10/18/18 1959    Tegeler, Canary Brim, MD 10/18/18 [phone]

## 2018-10-18 NOTE — Discharge Instructions (Addendum)
Please read attached information. If you experience any new or worsening signs or symptoms please return to the emergency room for evaluation. Please follow-up with your primary care provider or specialist as discussed.  °

## 2018-10-19 ENCOUNTER — Ambulatory Visit (INDEPENDENT_AMBULATORY_CARE_PROVIDER_SITE_OTHER): Payer: BLUE CROSS/BLUE SHIELD | Admitting: Pulmonary Disease

## 2018-10-19 ENCOUNTER — Encounter: Payer: Self-pay | Admitting: Pulmonary Disease

## 2018-10-19 ENCOUNTER — Telehealth: Payer: Self-pay | Admitting: Pulmonary Disease

## 2018-10-19 VITALS — BP 124/84 | HR 74 | Ht 68.0 in | Wt 178.0 lb

## 2018-10-19 DIAGNOSIS — R05 Cough: Secondary | ICD-10-CM

## 2018-10-19 DIAGNOSIS — R053 Chronic cough: Secondary | ICD-10-CM

## 2018-10-19 LAB — NITRIC OXIDE: Nitric Oxide: 47

## 2018-10-19 MED ORDER — BECLOMETHASONE DIPROPIONATE 80 MCG/ACT IN AERS: 2.0000 | INHALATION_SPRAY | Freq: Two times a day (BID) | RESPIRATORY_TRACT | 0 refills | Status: DC

## 2018-10-19 NOTE — Telephone Encounter (Signed)
Spoke with pt. Feels like she is having a reaction to Qvar. Reports that she feels like the medication is helping but she has noticed that she has become more "short winded" since taking Qvar at 1100 today. Pt denies feeling like her throat is closing up or not being able to swallow, chest tightness or wheezing. States that she can be sitting or moving around when she becomes "short winded." Pt would like Dr. Trena Platt recommendations.  Dr. Wynona Neat - please advise. Thanks.

## 2018-10-19 NOTE — Progress Notes (Signed)
Lindsey Jensen    [id number]    01-04-1986  Primary Care Physician:[facility], [facility]  Referring Physician: No referring provider defined for this encounter.  Chief complaint:  Patient with a cough for about a month duration  HPI:  Has had multiple bronchitic symptoms Son came in with an upper respiratory infection-she developed a cough shortness of breath afterwards She is had the flu recently Was in the ED recently for cough and chest tightness  Has tried courses of steroids and an inhaler-Proventil -These actually made her jittery  She smokes about 1/4 pack of cigarettes a day, currently on a patch  Recent exposure to a cat which also did exacerbate her symptoms  She has no history of allergies known to her,  no history of asthma She was not having any symptoms of shortness of breath or chronic cough or symptoms of a bronchitis prior to the last few months  Outpatient Encounter Medications as of 10/19/2018  Medication Sig  . acetaminophen (TYLENOL) 500 MG tablet Take 500 mg by mouth every 6 (six) hours as needed for mild pain or headache.  . [DISCONTINUED] benzonatate (TESSALON) 100 MG capsule Take 1 capsule (100 mg total) by mouth every 8 (eight) hours.  . [DISCONTINUED] cefUROXime (CEFTIN) 500 MG tablet Take 1 tablet (500 mg total) by mouth 2 (two) times daily with a meal.  . [DISCONTINUED] dextromethorphan (DELSYM) 30 MG/5ML liquid Take 5 mLs (30 mg total) by mouth 2 (two) times daily.  . [DISCONTINUED] HYDROcodone-acetaminophen (NORCO) 5-325 MG tablet Take 1 tablet by mouth every 4 (four) hours as needed for moderate pain (or coughing).  . [DISCONTINUED] norethindrone (MICRONOR,CAMILA,ERRIN) 0.35 MG tablet TAKE 1 TABLET BY MOUTH EVERY DAY   No facility-administered encounter medications on file as of 10/19/2018.     Allergies as of 10/19/2018  . (No Known Allergies)    Past Medical History:  Diagnosis Date  . Contraceptive education  01/26/2014  . Medical history non-contributory     Past Surgical History:  Procedure Laterality Date  . NO PAST SURGERIES      Family History  Problem Relation Age of Onset  . Heart disease Father        heart attack  . Diabetes Mother   . Depression Mother   . Anxiety disorder Mother   . Heart disease Mother   . Heart disease Maternal Grandmother        CHF  . Alzheimer's disease Maternal Grandfather     Social History   Socioeconomic History  . Marital status: Married    Spouse name: Not on file  . Number of children: Not on file  . Years of education: Not on file  . Highest education level: Not on file  Occupational History  . Not on file  Social Needs  . Financial resource strain: Not on file  . Food insecurity:    Worry: Not on file    Inability: Not on file  . Transportation needs:    Medical: Not on file    Non-medical: Not on file  Tobacco Use  . Smoking status: Current Every Day Smoker    Packs/day: 1.00    Types: Cigarettes    Last attempt to quit: 05/05/2017    Years since quitting: 1.4  . Smokeless tobacco: Never Used  Substance and Sexual Activity  . Alcohol use: No    Alcohol/week: 14.0 standard drinks    Types: 14 Glasses of wine per  week    Frequency: Never    Comment: occ  . Drug use: No  . Sexual activity: Yes    Birth control/protection: None  Lifestyle  . Physical activity:    Days per week: Not on file    Minutes per session: Not on file  . Stress: Not on file  Relationships  . Social connections:    Talks on phone: Not on file    Gets together: Not on file    Attends religious service: Not on file    Active member of club or organization: Not on file    Attends meetings of clubs or organizations: Not on file    Relationship status: Not on file  . Intimate partner violence:    Fear of current or ex partner: Not on file    Emotionally abused: Not on file    Physically abused: Not on file    Forced sexual activity: Not on file    Other Topics Concern  . Not on file  Social History Narrative  . Not on file    Review of Systems  Constitutional: Negative for fatigue.  HENT: Negative.   Eyes: Negative.   Respiratory: Positive for cough and chest tightness. Negative for shortness of breath and wheezing.   Cardiovascular: Negative.   Gastrointestinal: Negative.   Endocrine: Negative.   Genitourinary: Negative.     Vitals:   10/19/18 1032  BP: 124/84  Pulse: 74  SpO2: (!) 74%  Saturation was spurious  Physical Exam  Constitutional: She is oriented to person, place, and time. She appears well-developed and well-nourished.  HENT:  Head: Normocephalic and atraumatic.  Eyes: Pupils are equal, round, and reactive to light. Conjunctivae and EOM are normal. Right eye exhibits no discharge. Left eye exhibits no discharge.  Neck: Normal range of motion. Neck supple. No tracheal deviation present. No thyromegaly present.  Cardiovascular: Normal rate and regular rhythm.  Pulmonary/Chest: Effort normal and breath sounds normal. No respiratory distress.  Abdominal: Soft. Bowel sounds are normal. She exhibits no distension. There is no tenderness.  Musculoskeletal: Normal range of motion.  Neurological: She is alert and oriented to person, place, and time. No cranial nerve deficit.  Skin: Skin is warm and dry.     Data Reviewed: FeNo of 10 Recent chest x-ray performed 10/18/2018 reviewed by myself showing no acute infiltrate  Assessment:   Persistent acute bronchitis with accompanying cough Elevated FeNo suggestive of an inflammatory process in the airway She has no history of asthma known to her  Cough  bronchitis  Plan/Recommendations:  Trial with an inhaled steroid-Qvar 80, 2 puffs twice daily She does have a course of steroids on hand which I encouraged that she may take if needed  We will see her back in the office in 4 to 6 weeks  At that time we will repeat a feNo Will also obtain a PFT at  that time  Encouraged to call with any significant symptoms  She has a patch on for smoking cessation at present encouraged to stay off cigarettes   Virl Diamond MD [facility] 10/19/2018, 10:45 AM  CC: No ref. provider found

## 2018-10-19 NOTE — Telephone Encounter (Signed)
She can stop the QVAR  Should consider taking the course of prednisone she has in hand

## 2018-10-19 NOTE — Telephone Encounter (Signed)
  It may take a little bit of getting used to  From what she described, she will not come to any harm from it  More concerned if she was experiencing tongue swelling, sensation of her breathing tubes closing down on her

## 2018-10-19 NOTE — Telephone Encounter (Signed)
Called and spoke with patient she said she doesn't want to take the prednisone due to how it makes her feel  She wants to continue the medication and see how it does. She feels it was due to her stuffy nose and she feels like the QVAR is working.  She wants to know if she takes it again will the side effects be worse.  She is no longer experiencing the "short winded" feeling.  Dr. Wynona Neat please advise.

## 2018-10-19 NOTE — Telephone Encounter (Signed)
Spoke with patient. She is aware of Dr. Trena Platt recommendations. She verbalized understanding and will continue to take medication.   Nothing further needed at time of call.

## 2018-10-19 NOTE — Patient Instructions (Signed)
Persistent bronchitis with a cough Failed treatment with steroids Recent ED visit  FeNo today's elevated at 47 suggesting ongoing inflammation in your airway  Inhaled steroids will help  Next visit-we will repeat the [facility] We will also obtain a pulmonary function study  We will see you back in about 4 to 6 weeks

## 2018-10-21 ENCOUNTER — Institutional Professional Consult (permissible substitution): Payer: BLUE CROSS/BLUE SHIELD | Admitting: Emergency Medicine

## 2018-10-24 ENCOUNTER — Telehealth: Payer: Self-pay | Admitting: Pulmonary Disease

## 2018-10-24 NOTE — Telephone Encounter (Signed)
These are not common side effects to her Qvar.  I suspect this is probably due to the fact that she is currently trying to stop smoking.  I hope she continues to do so.  What strength patch is the patient using? 21mg ? Is the patient using any sort of short acting nicotine replacement therapy such as gum or lozenges?  Any nausea?  Elisha Headland, FNP

## 2018-10-24 NOTE — Telephone Encounter (Signed)
Spoke with pt, aware of recs.  Pt denies any nausea.  Is on the 14mg  patches.  Nothing further needed at this time.

## 2018-10-24 NOTE — Telephone Encounter (Signed)
Patient was just recently put on Qvar she has had fatigue and no appetite since she was put on the medication.  She would like to know if this is a symptoms/side effect of Qvar ?  Denies any SOB or chest tightness.  Arlys John NP please advise  Thanks

## 2018-11-02 ENCOUNTER — Ambulatory Visit (INDEPENDENT_AMBULATORY_CARE_PROVIDER_SITE_OTHER): Payer: BLUE CROSS/BLUE SHIELD | Admitting: Allergy & Immunology

## 2018-11-02 ENCOUNTER — Encounter: Payer: Self-pay | Admitting: Allergy & Immunology

## 2018-11-02 VITALS — BP 126/74 | HR 100 | Temp 98.1°F | Resp 18 | Ht 67.0 in | Wt 176.2 lb

## 2018-11-02 DIAGNOSIS — R059 Cough, unspecified: Secondary | ICD-10-CM

## 2018-11-02 DIAGNOSIS — J310 Chronic rhinitis: Secondary | ICD-10-CM | POA: Diagnosis not present

## 2018-11-02 DIAGNOSIS — R05 Cough: Secondary | ICD-10-CM | POA: Diagnosis not present

## 2018-11-02 MED ORDER — FLUTICASONE PROPIONATE 50 MCG/ACT NA SUSP: 2.0000 | Freq: Every day | NASAL | 5 refills | Status: DC

## 2018-11-02 MED ORDER — AZELASTINE HCL 0.1 % NA SOLN: NASAL | 5 refills | Status: DC

## 2018-11-02 MED ORDER — ALBUTEROL SULFATE HFA 108 (90 BASE) MCG/ACT IN AERS: 2.0000 | INHALATION_SPRAY | Freq: Four times a day (QID) | RESPIRATORY_TRACT | 1 refills | Status: DC | PRN

## 2018-11-02 NOTE — Progress Notes (Signed)
NEW PATIENT  Date of Service/Encounter:  11/02/18  Referring provider: Elfredia Nevins, MD   Assessment:   Cough  Chronic rhinitis    Lindsey Jensen presents for an evaluation of cough which has not been improved with an ICS as well as multiple courses of systemic steroids. She does note that taking cetirizine this morning did help for several hours, but otherwise nothing has seemed to have helped including antibiotics. Her physical exam is consistent with allergic rhinitis (clear mucous, boggy turbinates, cobblestoning), so we will try to maximize her treatment of allergic rhinitis to see if this helps with the postnasal drip and the coughing. We will follow up in two weeks to see how she does with this plan. We had considered starting Singulair, but she did not want anything that might interfere with her sleeping. We will get some bloodwork to test for allergies and get her back for skin testing if needed.    Plan/Recommendations:   1. Cough - Lung testing actually looks higher than normal, which points against asthma. - Continue with Proventil 2-4 puffs every 4-6 hours as needed. - I am not convinced that this is asthma, but instead postnasal drip.   2. Chronic rhinitis - We will need to get some blood testing to look for allergies. - We will call you in 1-2 weeks with the results of the testing.  - Continue with: Zyrtec (cetirizine) 10mg  tablet once daily - Start taking: Flonase (fluticasone) two sprays per nostril daily and Astelin (azelastine) 2 sprays per nostril 1-2 times daily as needed - You can use an extra dose of the antihistamine, if needed, for breakthrough symptoms.  - Consider nasal saline rinses 1-2 times daily to remove allergens from the nasal cavities as well as help with mucous clearance (this is especially helpful to do before the nasal sprays are given)  3. Return in about 2 weeks (around 11/16/2018).  Subjective:   Lindsey Jensen is a 32 y.o. female presenting  today for evaluation of  Chief Complaint  Patient presents with  . Cough    ongoing since 1st week of October. Strep the end of September. very dry cough, non productive. has used tessalon, albuterol, afrin, mucinex. all with no relief. pulmonologist advised inflammed airways and given Qvar anfd some relief but not much.     Lindsey Jensen has a history of the following: Patient Active Problem List   Diagnosis Date Noted  . Smoker 10/07/2017  . Generalized anxiety disorder 08/23/2013    History obtained from: chart review and patient.  Lindsey Jensen was referred by Elfredia Nevins, MD.     Lindsey Jensen is a 32 y.o. female presenting for evaluation of cough. She started having problems in October. Her 95mo son had a cold from daycare. She was also exposed to cat. She started off with chest pain. She was given antibiotics for bronchitis. The cough is never producing anything, but she feels that there is something that "needs to be broken up".  She does report some postnasal drip with this. She was started on Qvar two puffs BID by her Pulmonologist. Mucous is always clear. She does have night time coughing which is actually better than it was in the past. She does have a glass of wine at night to help calm her down. Now she has been having a sensation of needing to clear her throat. FeNO was elevated in the mid 40s.    She does not have reflux to her knowledge. She  was on Protonix four years ago. She was placed on three rounds of steroids without improvement at all. Tessalon pearls did not help at all. She did a Zyrtec today for the first time and she felt good for a few hours. She does not like how she feels on cough and cold medicine.   She was given Afrin at one point which did not really work. Otherwise, there is no history of other atopic diseases, including food allergies, environmental allergies, stinging insect allergies, eczema or urticaria. There is no significant infectious history. Vaccinations are up  to date.    Past Medical History: Patient Active Problem List   Diagnosis Date Noted  . Smoker 10/07/2017  . Generalized anxiety disorder 08/23/2013    Medication List:  Allergies as of 11/02/2018   No Known Allergies     Medication List        Accurate as of 11/02/18  2:43 PM. Always use your most recent med list.          beclomethasone 80 MCG/ACT inhaler Commonly known as:  QVAR Inhale 2 puffs into the lungs 2 (two) times daily.   fluconazole 150 MG tablet Commonly known as:  DIFLUCAN TAKE 1 TABLET (150 MG TOTAL) BY MOUTH ONCE FOR 1 DOSE. TAKE AFTER COMPLETING ANTIBIOTICS       Birth History: non-contributory  Developmental History: non-contributory.   Past Surgical History: Past Surgical History:  Procedure Laterality Date  . NO PAST SURGERIES       Family History: Family History  Problem Relation Age of Onset  . Heart disease Father        heart attack  . Diabetes Mother   . Depression Mother   . Anxiety disorder Mother   . Heart disease Mother   . Heart disease Maternal Grandmother        CHF  . Alzheimer's disease Maternal Grandfather      Social History: Lindsey Jensen lives at home with her husband and 5 mo child. They live in a house that was built in the 1960s. There is hardwoods in the main living areas and carpeting in the bedrooms. There is gas heating and central cooling. There is one dog in the home. There are no dust mite coverings on the bedding. There is tobacco exposure in the car. 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      Review of Systems: a 14-point review of systems is pertinent for what is mentioned in HPI.  Otherwise, all other systems were negative. Constitutional: negative other than that listed in the HPI Eyes: negative other than that listed in the HPI Ears, nose, mouth, throat, and face: negative other than that listed in the HPI Respiratory: negative other  than that listed in the HPI Cardiovascular: negative other than that listed in the HPI Gastrointestinal: negative other than that listed in the HPI Genitourinary: negative other than that listed in the HPI Integument: negative other than that listed in the HPI Hematologic: negative other than that listed in the HPI Musculoskeletal: negative other than that listed in the HPI Neurological: negative other than that listed in the HPI Allergy/Immunologic: negative other than that listed in the HPI    Objective:   Blood pressure 126/74, pulse 100, temperature 98.1 F (36.7 C), temperature source Oral, resp. rate 18, height 5\' 7"  (1.702 m), weight 176 lb 3.2 oz (79.9 kg), not currently breastfeeding. Body mass index is 27.6 kg/m.  Physical Exam:  General: Alert, interactive, in no acute distress. Pleasant and talkative female.  Eyes: No conjunctival injection bilaterally, no discharge on the right, no discharge on the left, no Horner-Trantas dots present and allergic shiners present bilaterally. PERRL bilaterally. EOMI without pain. No photophobia.  Ears: Right TM pearly gray with normal light reflex, Left TM pearly gray with normal light reflex, Right TM intact without perforation and Left TM intact without perforation.  Nose/Throat: External nose within normal limits and septum midline. Turbinates markedly edematous and pale with clear discharge. Posterior oropharynx markedly erythematous with cobblestoning in the posterior oropharynx. Tonsils 2+ without exudates.  Tongue without thrush. Neck: Supple without thyromegaly. Trachea midline. Adenopathy: no enlarged lymph nodes appreciated in the anterior cervical, occipital, axillary, epitrochlear, inguinal, or popliteal regions. Lungs: Clear to auscultation without wheezing, rhonchi or rales. No increased work of breathing. CV: Normal S1/S2. No murmurs. Capillary refill <2 seconds.  Abdomen: Nondistended, nontender. No guarding or rebound  tenderness. Bowel sounds present in all fields and hypoactive  Skin: Warm and dry, without lesions or rashes. Extremities:  No clubbing, cyanosis or edema. Neuro:   Grossly intact. No focal deficits appreciated. Responsive to questions.  Diagnostic studies:   Spirometry: results normal (FEV1: 3.89/118%, FVC: 4.71/114%, FEV1/FVC: 82%).    Spirometry consistent with normal pattern.   Allergy Studies: none       Malachi Bonds, MD [facility]

## 2018-11-02 NOTE — Patient Instructions (Addendum)
1. Cough - Lung testing actually looks higher than normal, which points against asthma. - Continue with Proventil 2-4 puffs every 4-6 hours as needed. - I am not convinced that this is asthma, but instead postnasal drip.   2. Chronic rhinitis - We will need to get some blood testing to look for allergies. - We will call you in 1-2 weeks with the results of the testing.  - Continue with: Zyrtec (cetirizine) 10mg  tablet once daily - Start taking: Flonase (fluticasone) two sprays per nostril daily and Astelin (azelastine) 2 sprays per nostril 1-2 times daily as needed - You can use an extra dose of the antihistamine, if needed, for breakthrough symptoms.  - Consider nasal saline rinses 1-2 times daily to remove allergens from the nasal cavities as well as help with mucous clearance (this is especially helpful to do before the nasal sprays are given)  3. Return in about 2 weeks (around 11/16/2018).  Please inform us of any Emergency Department visits, hospitalizations, or changes in symptoms. Call us before going to the ED for breathing or allergy symptoms since we might be able to fit you in for a sick visit. Feel free to contact us anytime with any questions, problems, or concerns.  It was a pleasure to meet you today!  Websites that have reliable patient information: 1. American Academy of Asthma, Allergy, and Immunology: www.aaaai.org 2. Food Allergy Research and Education (FARE): foodallergy.org 3. Mothers of Asthmatics: http://www.asthmacommunitynetwork.org 4. American College of Allergy, Asthma, and Immunology: MissingWeapons.cawww.acaai.org   Make sure you are registered to vote! If you have moved or changed any of your contact information, you will need to get this updated before voting!

## 2018-11-11 ENCOUNTER — Encounter: Payer: Self-pay | Admitting: Pulmonary Disease

## 2018-11-11 ENCOUNTER — Ambulatory Visit: Payer: BLUE CROSS/BLUE SHIELD

## 2018-11-11 ENCOUNTER — Ambulatory Visit (INDEPENDENT_AMBULATORY_CARE_PROVIDER_SITE_OTHER): Payer: BLUE CROSS/BLUE SHIELD | Admitting: Pulmonary Disease

## 2018-11-11 VITALS — BP 112/72 | HR 91 | Ht 67.6 in | Wt 179.2 lb

## 2018-11-11 DIAGNOSIS — R05 Cough: Secondary | ICD-10-CM

## 2018-11-11 DIAGNOSIS — R053 Chronic cough: Secondary | ICD-10-CM

## 2018-11-11 LAB — NITRIC OXIDE: NITRIC OXIDE: 13

## 2018-11-11 MED ORDER — MONTELUKAST SODIUM 10 MG PO TABS: 10.0000 mg | ORAL_TABLET | Freq: Every day | ORAL | 11 refills | Status: DC

## 2018-11-11 NOTE — Progress Notes (Signed)
Lindsey Jensen    [id number]    [birth year]  Primary Care Physician:Fusco, Lyman Bishop, MD  Referring Physician: [facility] [address] [place], [place] [address]  Chief complaint:  Patient with a cough for about a couple of months duration Symptoms are better  HPI:  Symptoms are better at present Cough is not as limiting, not as barky Not really bringing up any significant secretions No chest pains or chest discomfort  She was started on Astelin and the nasal steroids since her last visit here  She recollects that she did have possibly allergies in the past  Recent history with the flu  Was in the ED recently for cough and chest tightness  Has tried courses of steroids and an inhaler-Proventil -These actually made her jittery  She smokes about 1/4 pack of cigarettes a day, currently on a patch  Recent exposure to a cat which also did exacerbate her symptoms  Denies a history of asthma She was not having any symptoms of shortness of breath or chronic cough or symptoms of a bronchitis prior to the last few months  Outpatient Encounter Medications as of 11/11/2018  Medication Sig  . albuterol (PROVENTIL HFA;VENTOLIN HFA) 108 (90 Base) MCG/ACT inhaler Inhale 2 puffs into the lungs every 6 (six) hours as needed for wheezing or shortness of breath.  Marland Kitchen azelastine (ASTELIN) 0.1 % nasal spray 2 sprays each nostril 1-2 times daily as needed.  . beclomethasone (QVAR) 80 MCG/ACT inhaler Inhale 2 puffs into the lungs 2 (two) times daily.  . fluticasone (FLONASE) 50 MCG/ACT nasal spray Place 2 sprays into both nostrils daily.  . [DISCONTINUED] fluconazole (DIFLUCAN) 150 MG tablet TAKE 1 TABLET (150 MG TOTAL) BY MOUTH ONCE FOR 1 DOSE. TAKE AFTER COMPLETING ANTIBIOTICS   No facility-administered encounter medications on file as of 11/11/2018.     Allergies as of 11/11/2018  . (No Known Allergies)    Past Medical History:  Diagnosis Date  .  Contraceptive education 01/26/2014  . Medical history non-contributory     Past Surgical History:  Procedure Laterality Date  . NO PAST SURGERIES      Family History  Problem Relation Age of Onset  . Heart disease Father        heart attack  . Diabetes Mother   . Depression Mother   . Anxiety disorder Mother   . Heart disease Mother   . Heart disease Maternal Grandmother        CHF  . Alzheimer's disease Maternal Grandfather     Social History   Socioeconomic History  . Marital status: Married    Spouse name: Not on file  . Number of children: Not on file  . Years of education: Not on file  . Highest education level: Not on file  Occupational History  . Not on file  Social Needs  . Financial resource strain: Not on file  . Food insecurity:    Worry: Not on file    Inability: Not on file  . Transportation needs:    Medical: Not on file    Non-medical: Not on file  Tobacco Use  . Smoking status: Current Every Day Smoker    Packs/day: 0.50    Types: Cigarettes    Last attempt to quit: 05/05/2017    Years since quitting: 1.5  . Smokeless tobacco: Never Used  Substance and Sexual Activity  . Alcohol use: No    Alcohol/week: 14.0  standard drinks    Types: 14 Glasses of wine per week    Frequency: Never    Comment: occ  . Drug use: No  . Sexual activity: Yes    Birth control/protection: None  Lifestyle  . Physical activity:    Days per week: Not on file    Minutes per session: Not on file  . Stress: Not on file  Relationships  . Social connections:    Talks on phone: Not on file    Gets together: Not on file    Attends religious service: Not on file    Active member of club or organization: Not on file    Attends meetings of clubs or organizations: Not on file    Relationship status: Not on file  . Intimate partner violence:    Fear of current or ex partner: Not on file    Emotionally abused: Not on file    Physically abused: Not on file    Forced sexual  activity: Not on file  Other Topics Concern  . Not on file  Social History Narrative  . Not on file    Review of Systems  Constitutional: Negative for fatigue.  HENT: Negative.   Eyes: Negative.   Respiratory: Positive for cough. Negative for shortness of breath and wheezing.   Cardiovascular: Negative.   Gastrointestinal: Negative.   Endocrine: Negative.   All other systems reviewed and are negative.   Vitals:   11/11/18 1110  BP: 112/72  Pulse: 91  SpO2: 99%  Saturation was spurious  Physical Exam  Constitutional: She appears well-developed and well-nourished.  HENT:  Head: Normocephalic and atraumatic.  Eyes: Pupils are equal, round, and reactive to light. Conjunctivae and EOM are normal. Right eye exhibits no discharge. Left eye exhibits no discharge.  Neck: Normal range of motion. Neck supple. No tracheal deviation present. No thyromegaly present.  Cardiovascular: Normal rate and regular rhythm.  Pulmonary/Chest: Effort normal and breath sounds normal. No respiratory distress. She has no wheezes. She has no rales.  Abdominal: Soft. Bowel sounds are normal. She exhibits no distension. There is no tenderness.     Data Reviewed: FeNo of 47, decreased to 13 today's measurement Recent chest x-ray performed 10/18/2018 reviewed by myself showing no acute infiltrate  She did have a spirometry in the office recently revealing normal spirometric evaluation  Assessment:   Persistent acute bronchitis with accompanying cough -Symptoms a little bit better at the present time -Coughing episodes better  Nasal stuffiness and congestion -Was started on fluticasone and Astelin -This seems to be helping  Elevated FeNo suggestive of an inflammatory process in the airway -Level improved today  She has no history of asthma known to her  Cough -Improving symptomatology   Plan/Recommendations:  Trial with an inhaled steroid-Qvar 80, 2 puffs twice daily -With improving  symptoms, at some point will discontinue Qvar -Trial with Singulair  I will see her back in the office in about 3 to 4 months  At that time we will repeat a feNo  PFT could not be completed today as she did use a Qvar this morning, we will cancel as recent spirometry was within normal limits  Encouraged to call with any significant symptoms  Continue smoking cessation efforts   Virl DiamondAdewale Kinsey Cowsert MD [facility] 11/11/2018, 11:13 AM  CC: [facility]

## 2018-11-11 NOTE — Patient Instructions (Signed)
Cough is better  FeNo level is better today indicating that inflammation in your airway is  Improving 47 down to 14   We will cancel the PFT  We will start you on Singulair, 10 mg p.o. Daily   We will see you back in the office in about 3 to 4 months

## 2018-11-16 ENCOUNTER — Ambulatory Visit (INDEPENDENT_AMBULATORY_CARE_PROVIDER_SITE_OTHER): Payer: BLUE CROSS/BLUE SHIELD | Admitting: Allergy & Immunology

## 2018-11-16 ENCOUNTER — Encounter: Payer: Self-pay | Admitting: Allergy & Immunology

## 2018-11-16 VITALS — BP 110/68 | HR 96 | Resp 18

## 2018-11-16 DIAGNOSIS — J310 Chronic rhinitis: Secondary | ICD-10-CM | POA: Diagnosis not present

## 2018-11-16 NOTE — Progress Notes (Signed)
FOLLOW UP  Date of Service/Encounter:  11/16/18   Assessment:   Chronic rhinitis   Cough - resolved with control of nasal symptoms (somewhat)  2 Anneka presents for a two-week follow-up following initiation of an maximize nasal regimen.  She reports that this did help the cough somewhat.  However, she does still cough during the day.  Interestingly, her cough does not occur at night.  The cough does not seem to have changed since stopping the Qvar last week.  We are still awaiting the results of the allergen panel, which was drawn a couple of days ago.  With the results of the blood work, we will be able to provide more personalized recommendations regarding avoidance of her allergens.   Plan/Recommendations:   1. Chronic rhinitis - We will call you with the results of the blood testing.  - Stop the Zyrtec and try Allegra or Xyzal at home instead.  - Continue taking: Flonase (fluticasone) two sprays per nostril daily and Astelin (azelastine) 2 sprays per nostril 1-2 times daily as needed - You can use an extra dose of the antihistamine, if needed, for breakthrough symptoms.  - Consider nasal saline rinses 1-2 times daily to remove allergens from the nasal cavities as well as help with mucous clearance (this is especially helpful to do before the nasal sprays are given)  2. Return in about 3 months (around 02/16/2019).   Subjective:   RHANDI DESPAIN is a 32 y.o. female presenting today for follow up of  Chief Complaint  Patient presents with  . Cough    seen by pulmonology last Friday. stopped Qvar due to lowered pheno. still clearing her throat alot. cough is not as bad.    ENVY MENO has a history of the following: Patient Active Problem List   Diagnosis Date Noted  . Smoker 10/07/2017  . Generalized anxiety disorder 08/23/2013    History obtained from: chart review and patient.  Earlie Server Atkison's Primary Care Provider is Elfredia Nevins, MD.     Zakira is a 32 y.o. female  presenting for a follow up visit.  She was last seen on November 13.  At that time, she was evaluated for chronic rhinitis and cough.  Her physical exam was notable for boggy turbinates and clear discharge.  We did obtain blood work to look for environmental allergens.  We continued Zyrtec 10 mg once daily and added on Flonase and Astelin nasal spray.  Since the last visit, she has done well.  She did go to see her pulmonologist last week, who changed her from Qvar to Singulair. She has a follow up in February 2020. Apparently her FeNO went from 47 to 13 while on the Qvar. She stopped the Qvar Saturday night and started the montelukast on Sunday. She has developed worsening breathing problems with increased congestion since making this change, but she thinks that she needs to give Singulair some more time to work.  Nose sprays seem to be doing very well. She did have decreased coughing after starting the nose sprays.  She has remained on the nasal sprays since the last visit. Overall cough is less barky and she constantly feels that she needs to clear her throat. She her lab work sent earlier this week since she forgot about it until recently.   Otherwise, there have been no changes to her past medical history, surgical history, family history, or social history.    Review of Systems: a 14-point review of systems  is pertinent for what is mentioned in HPI.  Otherwise, all other systems were negative.  Constitutional: negative other than that listed in the HPI Eyes: negative other than that listed in the HPI Ears, nose, mouth, throat, and face: negative other than that listed in the HPI Respiratory: negative other than that listed in the HPI Cardiovascular: negative other than that listed in the HPI Gastrointestinal: negative other than that listed in the HPI Genitourinary: negative other than that listed in the HPI Integument: negative other than that listed in the HPI Hematologic: negative other  than that listed in the HPI Musculoskeletal: negative other than that listed in the HPI Neurological: negative other than that listed in the HPI Allergy/Immunologic: negative other than that listed in the HPI    Objective:   Blood pressure 110/68, pulse 96, resp. rate 18, SpO2 98 %, not currently breastfeeding. There is no height or weight on file to calculate BMI.   Physical Exam:  General: Alert, interactive, in no acute distress. Pleasant. Coughs once during the exam today.  Eyes: No conjunctival injection bilaterally, no discharge on the right, no discharge on the left and no Horner-Trantas dots present. PERRL bilaterally. EOMI without pain. No photophobia.  Ears: Right TM pearly gray with normal light reflex, Left TM pearly gray with normal light reflex, Right TM intact without perforation and Left TM intact without perforation.  Nose/Throat: External nose within normal limits and septum midline. Turbinates edematous and pale with clear discharge. Posterior oropharynx erythematous without cobblestoning in the posterior oropharynx. Tonsils 2+ without exudates.  Tongue without thrush. Lungs: Clear to auscultation without wheezing, rhonchi or rales. No increased work of breathing. CV: Normal S1/S2. No murmurs. Capillary refill <2 seconds.  Skin: Warm and dry, without lesions or rashes. Neuro:   Grossly intact. No focal deficits appreciated. Responsive to questions.  Diagnostic studies: none     Malachi BondsJoel Mohmed Farver, MD  [facility]

## 2018-11-16 NOTE — Patient Instructions (Addendum)
1. Chronic rhinitis - We will call you with the results of the blood testing.  - Stop the Zyrtec and try Allegra or Xyzal at home instead.  - Continue taking: Flonase (fluticasone) two sprays per nostril daily and Astelin (azelastine) 2 sprays per nostril 1-2 times daily as needed - You can use an extra dose of the antihistamine, if needed, for breakthrough symptoms.  - Consider nasal saline rinses 1-2 times daily to remove allergens from the nasal cavities as well as help with mucous clearance (this is especially helpful to do before the nasal sprays are given)  2. Return in about 3 months (around 02/16/2019).  Please inform us of any Emergency Department visits, hospitalizations, or changes in symptoms. Call us before going to the ED for breathing or allergy symptoms since we might be able to fit you in for a sick visit. Feel free to contact us anytime with any questions, problems, or concerns.  It was a pleasure to see you again today!  Websites that have reliable patient information: 1. American Academy of Asthma, Allergy, and Immunology: www.aaaai.org 2. Food Allergy Research and Education (FARE): foodallergy.org 3. Mothers of Asthmatics: http://www.asthmacommunitynetwork.org 4. American College of Allergy, Asthma, and Immunology: MissingWeapons.cawww.acaai.org   Make sure you are registered to vote! If you have moved or changed any of your contact information, you will need to get this updated before voting!

## 2018-11-17 LAB — IGE+ALLERGENS ZONE 2(30)
AMER SYCAMORE IGE QN: 0.13 kU/L — AB
Aspergillus Fumigatus IgE: <0.1 kU/L
Bahia Grass IgE: <0.1 kU/L
Bermuda Grass IgE: <0.1 kU/L
Cat Dander IgE: 18.7 kU/L — AB
Cedar, Mountain IgE: <0.1 kU/L
Common Silver Birch IgE: 16.7 kU/L — AB
D001-IGE D PTERONYSSINUS: 0.53 kU/L — AB
D002-IGE D FARINAE: 0.49 kU/L — AB
E005-IGE DOG DANDER: 58.8 kU/L — AB
Elm, American IgE: 0.24 kU/L — AB
I206-IGE COCKROACH, AMERICAN: 0.39 kU/L — AB
IgE (Immunoglobulin E), Serum: 655 IU/mL — ABNORMAL HIGH (ref 6–495)
Johnson Grass IgE: <0.1 kU/L
Mugwort IgE Qn: <0.1 kU/L
Oak, White IgE: 12.5 kU/L — AB
Pigweed, Rough IgE: <0.1 kU/L
Plantain, English IgE: <0.1 kU/L
Ragweed, Short IgE: 0.16 kU/L — AB
SWEET GUM IGE RAST QL: 1.24 kU/L — AB
Stemphylium Herbarum IgE: <0.1 kU/L
T001-IGE MAPLE/BOX ELDER: 0.26 kU/L — AB
T041-IGE HICKORY, WHITE: 1.34 kU/L — AB
Timothy Grass IgE: <0.1 kU/L
White Mulberry IgE: <0.1 kU/L

## 2018-11-23 ENCOUNTER — Encounter: Payer: Self-pay | Admitting: Allergy & Immunology

## 2018-11-26 LAB — PULMONARY FUNCTION TEST
DL/VA % pred: 111 %
DL/VA: 5.81 ml/min/mmHg/L
DLCO COR % PRED: 108 %
DLCO UNC: 34.37 ml/min/mmHg
DLCO cor: 31.57 ml/min/mmHg
DLCO unc % pred: 118 %
FEF 25-75 Pre: 5.66 L/sec
FEF2575-%PRED-PRE: 152 %
FEV1-%Pred-Pre: 115 %
FEV1-PRE: 4.12 L
FEV1FVC-%PRED-PRE: 107 %
FEV6-%PRED-PRE: 107 %
FEV6-Pre: 4.53 L
FEV6FVC-%Pred-Pre: 100 %
FVC-%Pred-Pre: 107 %
FVC-PRE: 4.55 L
PRE FEV1/FVC RATIO: 91 %
PRE FEV6/FVC RATIO: 100 %

## 2018-12-05 DIAGNOSIS — J329 Chronic sinusitis, unspecified: Secondary | ICD-10-CM | POA: Diagnosis not present

## 2018-12-05 DIAGNOSIS — J069 Acute upper respiratory infection, unspecified: Secondary | ICD-10-CM | POA: Diagnosis not present

## 2018-12-05 DIAGNOSIS — J029 Acute pharyngitis, unspecified: Secondary | ICD-10-CM | POA: Diagnosis not present

## 2018-12-12 ENCOUNTER — Encounter: Payer: Self-pay | Admitting: Allergy & Immunology

## 2019-01-04 ENCOUNTER — Encounter: Payer: Self-pay | Admitting: Pulmonary Disease

## 2019-01-04 ENCOUNTER — Ambulatory Visit (INDEPENDENT_AMBULATORY_CARE_PROVIDER_SITE_OTHER): Payer: BLUE CROSS/BLUE SHIELD | Admitting: Pulmonary Disease

## 2019-01-04 VITALS — BP 118/77 | HR 105 | Ht 68.0 in | Wt 180.0 lb

## 2019-01-04 DIAGNOSIS — R053 Chronic cough: Secondary | ICD-10-CM

## 2019-01-04 DIAGNOSIS — R05 Cough: Secondary | ICD-10-CM | POA: Diagnosis not present

## 2019-01-04 LAB — NITRIC OXIDE: Nitric Oxide: 17

## 2019-01-04 MED ORDER — BECLOMETHASONE DIPROPIONATE 80 MCG/ACT IN AERS: 2.0000 | INHALATION_SPRAY | Freq: Two times a day (BID) | RESPIRATORY_TRACT | 6 refills | Status: DC

## 2019-01-04 NOTE — Patient Instructions (Signed)
Cough Environmental allergies  We will resume Qvar- prescription sent in Continue using Singulair  Avoid known triggers  We will see you as scheduled end of February

## 2019-01-04 NOTE — Progress Notes (Signed)
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 dogs, cats, dust, roaches The dog that she has had for about 6 years  Not really bringing up any significant secretions No chest pains or chest discomfort  Has tried courses of steroids and an inhaler-Proventil -These actually made her jittery -She does use albuterol as needed  She smokes about 1/4 pack of cigarettes a day, currently on a patch  Denies a history of asthma She was not having any symptoms of shortness of breath or chronic cough or symptoms of a bronchitis prior to the last few months  She states most of her symptoms started post childbirth She notices worse symptoms just prior to her period  Outpatient Encounter Medications as of 01/04/2019  Medication Sig  . albuterol (PROVENTIL HFA;VENTOLIN HFA) 108 (90 Base) MCG/ACT inhaler Inhale 2 puffs into the lungs every 6 (six) hours as needed for wheezing or shortness of breath.  Marland Kitchen azelastine (ASTELIN) 0.1 % nasal spray 2 sprays each nostril 1-2 times daily as needed.  . fexofenadine-pseudoephedrine (ALLEGRA-D 24) 180-240 MG 24 hr tablet Take 1 tablet by mouth daily.  . fluticasone (FLONASE) 50 MCG/ACT nasal spray Place 2 sprays into both nostrils daily.  . montelukast (SINGULAIR) 10 MG tablet Take 1 tablet (10 mg total) by mouth at bedtime.   No facility-administered encounter medications on file as of 01/04/2019.     Allergies as of 01/04/2019  . (No Known Allergies)    Past Medical History:  Diagnosis Date  . Contraceptive education 01/26/2014  . Medical history non-contributory     Past Surgical History:  Procedure Laterality Date  . NO PAST SURGERIES      Family History    Problem Relation Age of Onset  . Heart disease Father        heart attack  . Diabetes Mother   . Depression Mother   . Anxiety disorder Mother   . Heart disease Mother   . Heart disease Maternal Grandmother        CHF  . Alzheimer's disease Maternal Grandfather     Social History   Socioeconomic History  . Marital status: Married    Spouse name: Not on file  . Number of children: Not on file  . Years of education: Not on file  . Highest education level: Not on file  Occupational History  . Not on file  Social Needs  . Financial resource strain: Not on file  . Food insecurity:    Worry: Not on file    Inability: Not on file  . Transportation needs:    Medical: Not on file    Non-medical: Not on file  Tobacco Use  . Smoking status: Current Every Day Smoker    Packs/day: 0.50    Types: Cigarettes    Last attempt to quit: 05/05/2017    Years since quitting: 1.6  . Smokeless tobacco: Never Used  Substance and Sexual Activity  . Alcohol use: No    Alcohol/week: 14.0 standard drinks    Types: 14 Glasses of wine per week    Frequency: Never    Comment: occ  . Drug use: No  . Sexual  activity: Yes    Birth control/protection: None  Lifestyle  . Physical activity:    Days per week: Not on file    Minutes per session: Not on file  . Stress: Not on file  Relationships  . Social connections:    Talks on phone: Not on file    Gets together: Not on file    Attends religious service: Not on file    Active member of club or organization: Not on file    Attends meetings of clubs or organizations: Not on file    Relationship status: Not on file  . Intimate partner violence:    Fear of current or ex partner: Not on file    Emotionally abused: Not on file    Physically abused: Not on file    Forced sexual activity: Not on file  Other Topics Concern  . Not on file  Social History Narrative  . Not on file    Review of Systems  Constitutional: Negative for fatigue.   HENT: Negative.        Throat irritation  Eyes: Negative.   Respiratory: Positive for cough. Negative for shortness of breath and wheezing.   Cardiovascular: Negative.   Gastrointestinal: Negative.   Endocrine: Negative.   All other systems reviewed and are negative.   Vitals:   01/04/19 1623  BP: 118/77  Pulse: (!) 105  SpO2: 96%  Saturation was spurious  Physical Exam  Constitutional: She appears well-developed and well-nourished.  HENT:  Head: Normocephalic and atraumatic.  Eyes: Pupils are equal, round, and reactive to light. Conjunctivae and EOM are normal. Right eye exhibits no discharge. Left eye exhibits no discharge.  Neck: Normal range of motion. Neck supple. No tracheal deviation present. No thyromegaly present.  Cardiovascular: Normal rate and regular rhythm.  Pulmonary/Chest: Effort normal and breath sounds normal. No respiratory distress. She has no wheezes. She has no rales. She exhibits no tenderness.  Abdominal: Soft. Bowel sounds are normal. She exhibits no distension. There is no abdominal tenderness.     Data Reviewed: FeNo of 47, decreased to 13, 17 on today's measurement Recent chest x-ray performed 10/18/2018 reviewed by myself showing no acute infiltrate  She did have a spirometry in the office recently revealing normal spirometric evaluation  Assessment:   Persistent acute bronchitis with accompanying cough -Symptoms a little bit better at the present time -Coughing episodes better  Nasal stuffiness and congestion -She will continue with Singulair  Elevated FeNo suggestive of an inflammatory process in the airway -Level improved   She has no history of asthma known to her  Cough -Improving symptomatology, still persistent  Multiple allergens   Plan/Recommendations:  Trial with an inhaled steroid-Qvar 80, 2 puffs twice daily -She will go back to using Qvar -Continue with Singulair  I will see her back in the office end of  February  At that time we will repeat a feNo  Continue smoking cessation efforts   Virl DiamondAdewale  MD [facility] 01/04/2019, 4:33 PM  CC: Elfredia NevinsFusco, Lawrence, MD

## 2019-01-13 ENCOUNTER — Ambulatory Visit: Payer: BLUE CROSS/BLUE SHIELD | Admitting: Obstetrics & Gynecology

## 2019-01-19 DIAGNOSIS — J111 Influenza due to unidentified influenza virus with other respiratory manifestations: Secondary | ICD-10-CM | POA: Diagnosis not present

## 2019-01-20 ENCOUNTER — Other Ambulatory Visit: Payer: Self-pay | Admitting: Allergy & Immunology

## 2019-01-20 DIAGNOSIS — J45909 Unspecified asthma, uncomplicated: Secondary | ICD-10-CM | POA: Diagnosis not present

## 2019-01-20 DIAGNOSIS — Z1389 Encounter for screening for other disorder: Secondary | ICD-10-CM | POA: Diagnosis not present

## 2019-01-20 DIAGNOSIS — J329 Chronic sinusitis, unspecified: Secondary | ICD-10-CM | POA: Diagnosis not present

## 2019-01-20 DIAGNOSIS — J309 Allergic rhinitis, unspecified: Secondary | ICD-10-CM | POA: Diagnosis not present

## 2019-01-20 DIAGNOSIS — Z6826 Body mass index (BMI) 26.0-26.9, adult: Secondary | ICD-10-CM | POA: Diagnosis not present

## 2019-01-24 ENCOUNTER — Other Ambulatory Visit: Payer: Self-pay

## 2019-01-24 ENCOUNTER — Ambulatory Visit: Payer: BLUE CROSS/BLUE SHIELD | Admitting: Obstetrics & Gynecology

## 2019-01-24 ENCOUNTER — Encounter: Payer: Self-pay | Admitting: Obstetrics & Gynecology

## 2019-01-24 VITALS — BP 119/76 | HR 83 | Ht 68.0 in | Wt 183.0 lb

## 2019-01-24 DIAGNOSIS — R053 Chronic cough: Secondary | ICD-10-CM

## 2019-01-24 DIAGNOSIS — R894 Abnormal immunological findings in specimens from other organs, systems and tissues: Secondary | ICD-10-CM | POA: Diagnosis not present

## 2019-01-24 DIAGNOSIS — R05 Cough: Secondary | ICD-10-CM | POA: Diagnosis not present

## 2019-01-24 NOTE — Progress Notes (Signed)
Chief Complaint  Patient presents with  . Cough    after delivery      33 y.o. G1P1001 Patient's last menstrual period was 01/06/2019. The current method of family planning is .  Outpatient Encounter Medications as of 01/24/2019  Medication Sig  . albuterol (PROVENTIL HFA;VENTOLIN HFA) 108 (90 Base) MCG/ACT inhaler TAKE 2 PUFFS BY MOUTH EVERY 6 HOURS AS NEEDED FOR WHEEZE OR SHORTNESS OF BREATH  . azelastine (ASTELIN) 0.1 % nasal spray 2 sprays each nostril 1-2 times daily as needed.  . beclomethasone (QVAR) 80 MCG/ACT inhaler Inhale 2 puffs into the lungs 2 (two) times daily.  Marland Kitchen doxycycline (VIBRAMYCIN) 100 MG capsule   . fluticasone (FLONASE) 50 MCG/ACT nasal spray Place 2 sprays into both nostrils daily.  Marland Kitchen loratadine (CLARITIN) 10 MG tablet Take 10 mg by mouth daily.  . montelukast (SINGULAIR) 10 MG tablet Take 1 tablet (10 mg total) by mouth at bedtime.  . [DISCONTINUED] fexofenadine-pseudoephedrine (ALLEGRA-D 24) 180-240 MG 24 hr tablet Take 1 tablet by mouth daily.   No facility-administered encounter medications on file as of 01/24/2019.     Subjective Pt has had chronic cough and reactive airway since 09/2018 shortly after delivery No fever Not productive Less "deep" now Been treated with a variety of immunologic suppression with minimal success CXR negative Labs reveal allergies to various and sundry normal exposures Past Medical History:  Diagnosis Date  . Contraceptive education 01/26/2014  . Medical history non-contributory     Past Surgical History:  Procedure Laterality Date  . NO PAST SURGERIES      OB History    Gravida  1   Para  1   Term  1   Preterm  0   AB  0   Living  1     SAB  0   TAB  0   Ectopic  0   Multiple  0   Live Births  1           No Known Allergies  Social History   Socioeconomic History  . Marital status: Married    Spouse name: Not on file  . Number of children: Not on file  . Years of education:  Not on file  . Highest education level: Not on file  Occupational History  . Not on file  Social Needs  . Financial resource strain: Not on file  . Food insecurity:    Worry: Not on file    Inability: Not on file  . Transportation needs:    Medical: Not on file    Non-medical: Not on file  Tobacco Use  . Smoking status: Current Every Day Smoker    Packs/day: 0.50    Types: Cigarettes    Last attempt to quit: 05/05/2017    Years since quitting: 1.7  . Smokeless tobacco: Never Used  Substance and Sexual Activity  . Alcohol use: No    Alcohol/week: 14.0 standard drinks    Types: 14 Glasses of wine per week    Frequency: Never    Comment: occ  . Drug use: No  . Sexual activity: Yes    Birth control/protection: None  Lifestyle  . Physical activity:    Days per week: Not on file    Minutes per session: Not on file  . Stress: Not on file  Relationships  . Social connections:    Talks on phone: Not on file    Gets together: Not on file  Attends religious service: Not on file    Active member of club or organization: Not on file    Attends meetings of clubs or organizations: Not on file    Relationship status: Not on file  Other Topics Concern  . Not on file  Social History Narrative  . Not on file    Family History  Problem Relation Age of Onset  . Heart disease Father        heart attack  . Diabetes Mother   . Depression Mother   . Anxiety disorder Mother   . Heart disease Mother   . Heart disease Maternal Grandmother        CHF  . Alzheimer's disease Maternal Grandfather     Medications:       Current Outpatient Medications:  .  albuterol (PROVENTIL HFA;VENTOLIN HFA) 108 (90 Base) MCG/ACT inhaler, TAKE 2 PUFFS BY MOUTH EVERY 6 HOURS AS NEEDED FOR WHEEZE OR SHORTNESS OF BREATH, Disp: 6.7 g, Rfl: 0 .  azelastine (ASTELIN) 0.1 % nasal spray, 2 sprays each nostril 1-2 times daily as needed., Disp: 30 mL, Rfl: 5 .  beclomethasone (QVAR) 80 MCG/ACT inhaler, Inhale  2 puffs into the lungs 2 (two) times daily., Disp: 1 Inhaler, Rfl: 6 .  doxycycline (VIBRAMYCIN) 100 MG capsule, , Disp: , Rfl:  .  fluticasone (FLONASE) 50 MCG/ACT nasal spray, Place 2 sprays into both nostrils daily., Disp: 16 g, Rfl: 5 .  loratadine (CLARITIN) 10 MG tablet, Take 10 mg by mouth daily., Disp: , Rfl:  .  montelukast (SINGULAIR) 10 MG tablet, Take 1 tablet (10 mg total) by mouth at bedtime., Disp: 30 tablet, Rfl: 11  Objective Blood pressure 119/76, pulse 83, height 5\' 8"  (1.727 m), weight 183 lb (83 kg), last menstrual period 01/06/2019, not currently breastfeeding.  Lungs clear no wheezing  Pertinent ROS No burning with urination, frequency or urgency No nausea, vomiting or diarrhea Nor fever chills or other constitutional symptoms   Labs or studies reviewed    Impression Diagnoses this Encounter::   ICD-10-CM   1. Chronic cough R05   2. 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   Labs or Scans Ordered: No orders of the defined types were placed in this encounter.   Management:: >amrit, chayawanaprash, respiratory inhalation and spray herbal oils for management of hyper reactive pulmonary immunologic process  Follow up Return in about 1 month (around 02/22/2019) for Follow up, with Dr Despina HiddenEure.        Face to face time:  25 minutes  Greater than 50% of the visit time was spent in counseling and coordination of care with the patient.  The summary and outline of the counseling and care coordination is summarized in the note above.   All questions were answered.

## 2019-02-03 ENCOUNTER — Encounter (HOSPITAL_COMMUNITY): Payer: Self-pay | Admitting: Emergency Medicine

## 2019-02-03 ENCOUNTER — Emergency Department (HOSPITAL_COMMUNITY): Payer: BLUE CROSS/BLUE SHIELD

## 2019-02-03 ENCOUNTER — Emergency Department (HOSPITAL_COMMUNITY)
Admission: EM | Admit: 2019-02-03 | Discharge: 2019-02-03 | Disposition: A | Discharge status: Home / Self Care | Payer: BLUE CROSS/BLUE SHIELD | Attending: Emergency Medicine | Admitting: Emergency Medicine

## 2019-02-03 ENCOUNTER — Other Ambulatory Visit: Payer: Self-pay

## 2019-02-03 DIAGNOSIS — R0789 Other chest pain: Secondary | ICD-10-CM

## 2019-02-03 DIAGNOSIS — R079 Chest pain, unspecified: Secondary | ICD-10-CM | POA: Diagnosis not present

## 2019-02-03 DIAGNOSIS — F17210 Nicotine dependence, cigarettes, uncomplicated: Secondary | ICD-10-CM | POA: Diagnosis not present

## 2019-02-03 DIAGNOSIS — Z79899 Other long term (current) drug therapy: Secondary | ICD-10-CM | POA: Diagnosis not present

## 2019-02-03 DIAGNOSIS — R7989 Other specified abnormal findings of blood chemistry: Secondary | ICD-10-CM | POA: Diagnosis not present

## 2019-02-03 LAB — COMPREHENSIVE METABOLIC PANEL
ALT: 17 U/L (ref 0–44)
AST: 13 U/L — AB (ref 15–41)
Albumin: 4.3 g/dL (ref 3.5–5.0)
Alkaline Phosphatase: 55 U/L (ref 38–126)
Anion gap: 10 (ref 5–15)
BUN: 10 mg/dL (ref 6–20)
CHLORIDE: 105 mmol/L (ref 98–111)
CO2: 24 mmol/L (ref 22–32)
CREATININE: 0.66 mg/dL (ref 0.44–1.00)
Calcium: 9.1 mg/dL (ref 8.9–10.3)
GFR calc Af Amer: >60 mL/min (ref >60)
Glucose, Bld: 97 mg/dL (ref 70–99)
Potassium: 3.6 mmol/L (ref 3.5–5.1)
Sodium: 139 mmol/L (ref 135–145)
Total Bilirubin: 0.6 mg/dL (ref 0.3–1.2)
Total Protein: 7.4 g/dL (ref 6.5–8.1)

## 2019-02-03 LAB — CBC WITH DIFFERENTIAL/PLATELET
ABS IMMATURE GRANULOCYTES: 0.04 10*3/uL (ref 0.00–0.07)
BASOS PCT: 1 %
Basophils Absolute: 0.1 10*3/uL (ref 0.0–0.1)
Eosinophils Absolute: 0.1 10*3/uL (ref 0.0–0.5)
Eosinophils Relative: 1 %
HCT: 50.3 % — ABNORMAL HIGH (ref 36.0–46.0)
Hemoglobin: 16.6 g/dL — ABNORMAL HIGH (ref 12.0–15.0)
IMMATURE GRANULOCYTES: 0 %
LYMPHS PCT: 26 %
Lymphs Abs: 2.4 10*3/uL (ref 0.7–4.0)
MCH: 31.1 pg (ref 26.0–34.0)
MCHC: 33 g/dL (ref 30.0–36.0)
MCV: 94.4 fL (ref 80.0–100.0)
MONOS PCT: 7 %
Monocytes Absolute: 0.6 10*3/uL (ref 0.1–1.0)
NEUTROS ABS: 5.8 10*3/uL (ref 1.7–7.7)
NEUTROS PCT: 65 %
PLATELETS: 245 10*3/uL (ref 150–400)
RBC: 5.33 MIL/uL — ABNORMAL HIGH (ref 3.87–5.11)
RDW: 12.6 % (ref 11.5–15.5)
WBC: 9.1 10*3/uL (ref 4.0–10.5)
nRBC: 0 % (ref 0.0–0.2)

## 2019-02-03 LAB — I-STAT BETA HCG BLOOD, ED (MC, WL, AP ONLY): I-stat hCG, quantitative: <5 m[IU]/mL (ref <5)

## 2019-02-03 LAB — D-DIMER, QUANTITATIVE: D-Dimer, Quant: 1.15 ug/mL-FEU — ABNORMAL HIGH (ref 0.00–0.50)

## 2019-02-03 LAB — TROPONIN I: Troponin I: <0.03 ng/mL (ref <0.03)

## 2019-02-03 LAB — TSH: TSH: 1.612 u[IU]/mL (ref 0.350–4.500)

## 2019-02-03 LAB — LIPASE, BLOOD: Lipase: 20 U/L (ref 11–51)

## 2019-02-03 MED ORDER — IOPAMIDOL (ISOVUE-370) INJECTION 76%
100.0000 mL | Freq: Once | INTRAVENOUS | Status: AC | PRN
  Administered 2019-02-03: 100 mL via INTRAVENOUS

## 2019-02-03 MED ORDER — NAPROXEN 500 MG PO TABS: ORAL_TABLET | ORAL | 0 refills | Status: DC

## 2019-02-03 NOTE — ED Triage Notes (Signed)
Chest pain near epigastric area that radiates around to center of back. Also reports feeling like her heart was racing.

## 2019-02-03 NOTE — Discharge Instructions (Addendum)
Follow-up with Dr. Purvis Sheffield or 1 of his partners for your palpitations.  Also follow-up with Dr. Sherwood Gambler next week if not improving.

## 2019-02-03 NOTE — ED Provider Notes (Signed)
[facility] EMERGENCY DEPARTMENT Provider Note   CSN: [id number] Arrival date & time: 02/03/19  1805     History   Chief Complaint Chief Complaint  Patient presents with  . Chest Pain    HPI Lindsey Jensen is a 33 y.o. female.  HPI Patient states that at 11 AM this morning she developed sharp chest pain that radiated through to her back.  Patient states the sharp pain is since improved and now has a dull ache in the center of her chest.  She denies any nausea or vomiting.  No shortness of breath.  She has had associated intermittent palpitations which she describes as rapid heartbeat.  She also endorses generalized fatigue.  No recent extended travel or immobilization.  No lower extremity swelling or pain.  No Fever or chills.  Patient with intermittent caffeine intake.  Also recently started herbal supplementation with Chyawanaprosh and Amrit.  States that her mood has improved significantly after starting these supplements.  Did not take the supplements today. Past Medical History:  Diagnosis Date  . Contraceptive education 01/26/2014  . Medical history non-contributory     Patient Active Problem List   Diagnosis Date Noted  . Smoker 10/07/2017  . Generalized anxiety disorder 08/23/2013    Past Surgical History:  Procedure Laterality Date  . NO PAST SURGERIES       OB History    Gravida  1   Para  1   Term  1   Preterm  0   AB  0   Living  1     SAB  0   TAB  0   Ectopic  0   Multiple  0   Live Births  1            Home Medications    Prior to Admission medications   Medication Sig Start Date End Date Taking? Authorizing Provider  albuterol (PROVENTIL HFA;VENTOLIN HFA) 108 (90 Base) MCG/ACT inhaler TAKE 2 PUFFS BY MOUTH EVERY 6 HOURS AS NEEDED FOR WHEEZE OR SHORTNESS OF BREATH 01/20/19   Alfonse SpruceGallagher, Joel Louis, MD  azelastine (ASTELIN) 0.1 % nasal spray 2 sprays each nostril 1-2 times daily as needed. 11/02/18   Alfonse SpruceGallagher, Joel Louis, MD    beclomethasone (QVAR) 80 MCG/ACT inhaler Inhale 2 puffs into the lungs 2 (two) times daily. 01/04/19   Tomma Lightninglalere, Adewale A, MD  doxycycline (VIBRAMYCIN) 100 MG capsule  01/20/19   [provider]  fluticasone (FLONASE) 50 MCG/ACT nasal spray Place 2 sprays into both nostrils daily. 11/02/18   Alfonse SpruceGallagher, Joel Louis, MD  loratadine (CLARITIN) 10 MG tablet Take 10 mg by mouth daily.    [provider]  montelukast (SINGULAIR) 10 MG tablet Take 1 tablet (10 mg total) by mouth at bedtime. 11/11/18   Tomma Lightninglalere, Adewale A, MD    Family History Family History  Problem Relation Age of Onset  . Heart disease Father        heart attack  . Diabetes Mother   . Depression Mother   . Anxiety disorder Mother   . Heart disease Mother   . Heart disease Maternal Grandmother        CHF  . Alzheimer's disease Maternal Grandfather     Social History Social History   Tobacco Use  . Smoking status: Current Every Day Smoker    Packs/day: 0.50    Types: Cigarettes  . Smokeless tobacco: Never Used  Substance Use Topics  . Alcohol use: No  Alcohol/week: 14.0 standard drinks    Types: 14 Glasses of wine per week    Frequency: Never    Comment: glass of wine every night or every other night   . Drug use: No     Allergies   Patient has no known allergies.   Review of Systems Review of Systems  Constitutional: Positive for fatigue. Negative for chills and fever.  HENT: Negative for sore throat and trouble swallowing.   Eyes: Negative for visual disturbance.  Respiratory: Negative for cough, shortness of breath and wheezing.   Cardiovascular: Positive for chest pain and palpitations. Negative for leg swelling.  Gastrointestinal: Negative for abdominal pain, constipation, diarrhea, nausea and vomiting.  Genitourinary: Negative for dysuria, flank pain and frequency.  Musculoskeletal: Positive for back pain. Negative for myalgias and neck pain.  Skin: Negative for rash and wound.   Neurological: Positive for tremors and light-headedness. Negative for weakness, numbness and headaches.  Psychiatric/Behavioral: The patient is nervous/anxious.   All other systems reviewed and are negative.    Physical Exam Updated Vital Signs BP (!) 131/97   Pulse 94   Temp 98.1 F (36.7 C) (Temporal)   Resp (!) 24   Ht 5\' 8"  (1.727 m)   Wt 83 kg   LMP 01/30/2019 (Approximate)   SpO2 97%   BMI 27.82 kg/m   Physical Exam Vitals signs and nursing note reviewed.  Constitutional:      Appearance: Normal appearance. She is well-developed.     Comments: Anxious appearing  HENT:     Head: Normocephalic and atraumatic.     Nose: Nose normal. No congestion.     Mouth/Throat:     Mouth: Mucous membranes are moist.     Pharynx: No oropharyngeal exudate or posterior oropharyngeal erythema.  Eyes:     General: No scleral icterus.    Extraocular Movements: Extraocular movements intact.     Pupils: Pupils are equal, round, and reactive to light.  Neck:     Musculoskeletal: Normal range of motion and neck supple. No neck rigidity or muscular tenderness.     Comments: No thyromegaly. Cardiovascular:     Rate and Rhythm: Regular rhythm. Tachycardia present.     Heart sounds: No murmur. No friction rub. No gallop.      Comments: Intermittent tachycardia with pulse up to 120 and down to 90 Pulmonary:     Effort: Pulmonary effort is normal. No respiratory distress.     Breath sounds: Normal breath sounds. No stridor. No wheezing, rhonchi or rales.  Chest:     Chest wall: No tenderness.  Abdominal:     General: Bowel sounds are normal. There is no distension.     Palpations: Abdomen is soft.     Tenderness: There is no abdominal tenderness. There is no right CVA tenderness, left CVA tenderness, guarding or rebound.  Musculoskeletal: Normal range of motion.        General: No swelling, tenderness, deformity or signs of injury.     Right lower leg: No edema.     Left lower leg: No  edema.  Lymphadenopathy:     Cervical: No cervical adenopathy.  Skin:    General: Skin is warm and dry.     Capillary Refill: Capillary refill takes less than 2 seconds.     Findings: No erythema or rash.  Neurological:     General: No focal deficit present.     Mental Status: She is alert and oriented to person, place, and time.  Comments: Fine tremor.  5/5 motor in all extremities.  Sensation fully intact.  2+ patellar DTRs bilaterally.  Psychiatric:        Behavior: Behavior normal.     Comments: Anxious appearing.  Mildly pressured speech.      ED Treatments / Results  Labs (all labs ordered are listed, but only abnormal results are displayed) Labs Reviewed  CBC WITH DIFFERENTIAL/PLATELET - Abnormal; Notable for the following components:      Result Value   RBC 5.33 (*)    Hemoglobin 16.6 (*)    HCT 50.3 (*)    All other components within normal limits  COMPREHENSIVE METABOLIC PANEL - Abnormal; Notable for the following components:   AST 13 (*)    All other components within normal limits  D-DIMER, QUANTITATIVE (NOT AT [facility]) - Abnormal; Notable for the following components:   D-Dimer, Quant 1.15 (*)    All other components within normal limits  TROPONIN I  LIPASE, BLOOD  TSH  I-STAT BETA HCG BLOOD, ED ([facility], [facility], [facility] ONLY)    EKG EKG Interpretation  Date/Time:  Friday February 03 2019 18:21:35 EST Ventricular Rate:  100 PR Interval:    QRS Duration: 98 QT Interval:  354 QTC Calculation: 457 R Axis:   103 Text Interpretation:  Sinus tachycardia Confirmed by Loren Racer ([id number]) on 02/03/2019 7:04:17 PM   Radiology Dg Chest 2 View  Result Date: 02/03/2019 CLINICAL DATA:  Chest pain in the epigastric region radiating to the center of the back. EXAM: CHEST - 2 VIEW COMPARISON:  None. FINDINGS: The heart size and mediastinal contours are within normal limits. No mediastinal widening. Nonaneurysmal thoracic aorta. Both lungs are clear. The visualized skeletal  structures are unremarkable. IMPRESSION: No active cardiopulmonary disease. Electronically Signed   By: Tollie Eth M.D.   On: 02/03/2019 20:01    Procedures Procedures (including critical care time)  Medications Ordered in ED Medications  iopamidol (ISOVUE-370) 76 % injection 100 mL (100 mLs Intravenous Contrast Given 02/03/19 2204)     Initial Impression / Assessment and Plan / ED Course  I have reviewed the triage vital signs and the nursing notes.  Pertinent labs & imaging results that were available during my care of the patient were reviewed by me and considered in my medical decision making (see chart for details).     Signed out to oncoming emergency physician pending CT chest to rule out PE.  Final Clinical Impressions(s) / ED Diagnoses   Final diagnoses:  None    ED Discharge Orders    None       Loren Racer, MD 02/03/19 2213

## 2019-02-15 ENCOUNTER — Ambulatory Visit: Payer: BLUE CROSS/BLUE SHIELD | Admitting: Pulmonary Disease

## 2019-02-15 VITALS — BP 128/80 | HR 89 | Ht 68.0 in | Wt 179.6 lb

## 2019-02-15 DIAGNOSIS — R05 Cough: Secondary | ICD-10-CM

## 2019-02-15 DIAGNOSIS — R059 Cough, unspecified: Secondary | ICD-10-CM

## 2019-02-15 NOTE — Progress Notes (Signed)
Lindsey Jensen    [id number]    1986/01/03  Primary Care Physician:Fusco, Lyman Bishop, MD  Referring Physician: Elfredia Nevins, MD [address] 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  Not really bringing up any significant secretions No chest pains or chest discomfort  Has tried courses of steroids and an inhaler-Proventil -These actually made her jittery -She does use albuterol as needed  She smokes about 1/4 pack of cigarettes a day, currently on a patch  Denies a history of asthma She was not having any symptoms of shortness of breath or chronic cough or symptoms of a bronchitis prior to the last few months  She states most of her symptoms started post childbirth She notices worse symptoms just prior to her period  Outpatient Encounter Medications as of 02/15/2019  Medication Sig  . albuterol (PROVENTIL HFA;VENTOLIN HFA) 108 (90 Base) MCG/ACT inhaler TAKE 2 PUFFS BY MOUTH EVERY 6 HOURS AS NEEDED FOR WHEEZE OR SHORTNESS OF BREATH  . beclomethasone (QVAR) 80 MCG/ACT inhaler Inhale 2 puffs into the lungs 2 (two) times daily.  . montelukast (SINGULAIR) 10 MG tablet Take 1 tablet (10 mg total) by mouth at bedtime.  . [DISCONTINUED] azelastine (ASTELIN) 0.1 % nasal spray 2 sprays each nostril 1-2 times daily as needed.  . [DISCONTINUED] doxycycline (VIBRAMYCIN) 100 MG capsule   . [DISCONTINUED] fluticasone (FLONASE) 50 MCG/ACT nasal spray Place 2 sprays into both nostrils daily.  . [DISCONTINUED] loratadine (CLARITIN) 10 MG tablet Take 10 mg by mouth  daily.  . [DISCONTINUED] naproxen (NAPROSYN) 500 MG tablet Take 1 pill every 12 hours if needed for discomfort   No facility-administered encounter medications on file as of 02/15/2019.     Allergies as of 02/15/2019  . (No Known Allergies)    Past Medical History:  Diagnosis Date  . Contraceptive education 01/26/2014  . Medical history non-contributory     Past Surgical History:  Procedure Laterality Date  . NO PAST SURGERIES      Family History  Problem Relation Age of Onset  . Heart disease Father        heart attack  . Diabetes Mother   . Depression Mother   . Anxiety disorder Mother   . Heart disease Mother   . Heart disease Maternal Grandmother        CHF  . Alzheimer's disease Maternal Grandfather     Social History   Socioeconomic History  . Marital status: Married    Spouse name: Not on file  . Number of children: Not on file  . Years of education: Not on file  . Highest education level: Not on file  Occupational History  . Not on file  Social Needs  . Financial resource strain: Not on file  . Food insecurity:    Worry: Not on file    Inability: Not on file  . Transportation needs:    Medical: Not on file    Non-medical: Not on file  Tobacco Use  . Smoking status: Current Every Day Smoker    Packs/day:  0.50    Types: Cigarettes  . Smokeless tobacco: Never Used  Substance and Sexual Activity  . Alcohol use: No    Alcohol/week: 14.0 standard drinks    Types: 14 Glasses of wine per week    Frequency: Never    Comment: glass of wine every night or every other night   . Drug use: No  . Sexual activity: Yes    Birth control/protection: None  Lifestyle  . Physical activity:    Days per week: Not on file    Minutes per session: Not on file  . Stress: Not on file  Relationships  . Social connections:    Talks on phone: Not on file    Gets together: Not on file    Attends religious service: Not on file    Active member of club or organization:  Not on file    Attends meetings of clubs or organizations: Not on file    Relationship status: Not on file  . Intimate partner violence:    Fear of current or ex partner: Not on file    Emotionally abused: Not on file    Physically abused: Not on file    Forced sexual activity: Not on file  Other Topics Concern  . Not on file  Social History Narrative  . Not on file    Review of Systems  Constitutional: Negative for fatigue.  HENT: Negative.        Throat irritation  Eyes: Negative.   Respiratory: Positive for cough. Negative for shortness of breath and wheezing.   Cardiovascular: Negative.   Gastrointestinal: Negative.   All other systems reviewed and are negative.   Vitals:   02/15/19 1131  BP: 128/80  Pulse: 89  SpO2: 100%  Saturation was spurious  Physical Exam  Constitutional: She appears well-developed and well-nourished.  HENT:  Head: Normocephalic and atraumatic.  Eyes: Pupils are equal, round, and reactive to light. Conjunctivae and EOM are normal. Right eye exhibits no discharge. Left eye exhibits no discharge.  Neck: Normal range of motion. Neck supple. No tracheal deviation present. No thyromegaly present.  Cardiovascular: Normal rate and regular rhythm.  Pulmonary/Chest: Effort normal and breath sounds normal. No respiratory distress. She has no wheezes. She has no rales. She exhibits no tenderness.     Data Reviewed: FeNo of 47, decreased to 13, 17 previously, was not repeated today Recent chest x-ray performed 10/18/2018 reviewed by myself showing no acute infiltrate  She did have a spirometry in the office recently revealing normal spirometric evaluation Of the chest on 02/03/2019-unremarkable-4 mm granuloma right apex  Assessment:   Persistent acute bronchitis with accompanying cough -Symptoms a little bit better at the present time -Coughing episodes better -Recent ED visit with resolving symptoms at present  Nasal stuffiness and  congestion -She will continue with Singulair  Elevated FeNo suggestive of an inflammatory process in the airway -Level improved   She has no history of asthma known to her  Cough -Improving symptomatology, improving  Multiple allergens   Plan/Recommendations: Inhaled steroid-Qvar 80, 2 puffs twice daily -Continue Qvar  Continue Singulair  I will see her back in the office in about 3 months  Continue smoking cessation efforts   Virl Diamond MD [facility] 02/15/2019, 11:56 AM  CC: Elfredia Nevins, MD

## 2019-02-15 NOTE — Patient Instructions (Signed)
Recent ED visit with resolution of symptoms are present  No changes Continue Qvar, continue Singulair  We will see you back in 3 months

## 2019-02-16 DIAGNOSIS — M79604 Pain in right leg: Secondary | ICD-10-CM | POA: Diagnosis not present

## 2019-02-16 DIAGNOSIS — I83811 Varicose veins of right lower extremities with pain: Secondary | ICD-10-CM | POA: Diagnosis not present

## 2019-02-21 ENCOUNTER — Ambulatory Visit: Payer: Self-pay | Admitting: Obstetrics & Gynecology

## 2019-02-22 ENCOUNTER — Ambulatory Visit: Payer: BLUE CROSS/BLUE SHIELD | Admitting: Allergy & Immunology

## 2019-02-27 ENCOUNTER — Other Ambulatory Visit: Payer: Self-pay | Admitting: Allergy & Immunology

## 2019-02-28 ENCOUNTER — Other Ambulatory Visit: Payer: Self-pay | Admitting: Allergy & Immunology

## 2019-02-28 ENCOUNTER — Ambulatory Visit: Payer: BLUE CROSS/BLUE SHIELD | Admitting: Cardiology

## 2019-02-28 NOTE — Telephone Encounter (Signed)
Gave 1 courtesy refill of albuterol- pt needs ov for any future refills.

## 2019-03-10 ENCOUNTER — Ambulatory Visit: Payer: BLUE CROSS/BLUE SHIELD | Admitting: Psychology

## 2019-03-22 ENCOUNTER — Ambulatory Visit: Payer: BLUE CROSS/BLUE SHIELD | Admitting: Cardiology

## 2019-03-23 ENCOUNTER — Telehealth: Payer: Self-pay | Admitting: Pulmonary Disease

## 2019-03-23 MED ORDER — NYSTATIN 100000 UNIT/ML MT SUSP: 5.0000 mL | Freq: Four times a day (QID) | OROMUCOSAL | 0 refills | Status: DC

## 2019-03-23 NOTE — Telephone Encounter (Signed)
She is having a sore throat with white film on the back of her throat and tongue. No fever,SOB. She does note that she has not been washing her mouth out after taking her inhaler.   BM please advise.

## 2019-03-23 NOTE — Telephone Encounter (Signed)
Can offer:   Nystatin 500,000 units suspension /100,000 units/mL >>>5 mL's every 6 hours for 7 days >>>Try to retain nystatin in mouth as long as possible  Place order please.   If symptoms do not improve then will need televisit.   Arlys John

## 2019-03-23 NOTE — Telephone Encounter (Signed)
Called and spoke with pt stating to her that Arlys John wants [place] to send Rx nystatin to see if this would help with the thrush in her mouth. Pt expressed understanding. Verified pt's preferred pharmacy and sent Rx in for pt. Nothing further needed.

## 2019-03-27 ENCOUNTER — Encounter: Payer: Self-pay | Admitting: Allergy & Immunology

## 2019-03-28 ENCOUNTER — Other Ambulatory Visit: Payer: Self-pay | Admitting: Allergy & Immunology

## 2019-04-04 ENCOUNTER — Other Ambulatory Visit: Payer: Self-pay

## 2019-04-04 MED ORDER — ALBUTEROL SULFATE HFA 108 (90 BASE) MCG/ACT IN AERS: 2.0000 | INHALATION_SPRAY | Freq: Four times a day (QID) | RESPIRATORY_TRACT | 0 refills | Status: DC | PRN

## 2019-04-06 ENCOUNTER — Other Ambulatory Visit: Payer: Self-pay | Admitting: Women's Health

## 2019-04-06 ENCOUNTER — Telehealth: Payer: Self-pay | Admitting: Women's Health

## 2019-04-06 MED ORDER — FLUCONAZOLE 150 MG PO TABS: 150.0000 mg | ORAL_TABLET | Freq: Once | ORAL | 0 refills | Status: AC

## 2019-04-06 NOTE — Telephone Encounter (Signed)
Pt is wanting to see if diflucan can be sent in for a yeast infection.

## 2019-04-23 ENCOUNTER — Telehealth: Payer: BLUE CROSS/BLUE SHIELD | Admitting: Family

## 2019-04-23 ENCOUNTER — Other Ambulatory Visit: Payer: Self-pay | Admitting: Allergy & Immunology

## 2019-04-23 DIAGNOSIS — R6883 Chills (without fever): Secondary | ICD-10-CM

## 2019-04-23 DIAGNOSIS — L0291 Cutaneous abscess, unspecified: Secondary | ICD-10-CM

## 2019-04-23 DIAGNOSIS — L089 Local infection of the skin and subcutaneous tissue, unspecified: Secondary | ICD-10-CM | POA: Diagnosis not present

## 2019-04-23 NOTE — Progress Notes (Signed)
Based on what you shared with me, I feel your condition warrants further evaluation and I recommend that you be seen for a face to face office visit.  Given your symptoms of body aches, chest tightness with an infection you need to be seen face to face to be evaluated. It sounds like you may have a serious infection and needs to be evaluated.     NOTE: If you entered your credit card information for this eVisit, you will not be charged. You may see a "hold" on your card for the $35 but that hold will drop off and you will not have a charge processed.  If you are having a true medical emergency please call 911.  If you need an urgent face to face visit, [facility] has four urgent care centers for your convenience.    PLEASE NOTE: THE [facility] LOCATIONS AND URGENT CARE CLINICS DO NOT HAVE THE TESTING FOR CORONAVIRUS COVID19 AVAILABLE.  IF YOU FEEL YOU NEED THIS TEST YOU MUST GO TO A TRIAGE LOCATION AT ONE OF THE HOSPITAL EMERGENCY DEPARTMENTS   [email or web address] to reserve your spot online an avoid wait times  [facility] [address], [address] [place], [place] [address] Modified hours of operation: Monday-Friday, 12 PM to 6 PM  Saturday & Sunday 10 AM to 4 PM *Across the street from [employer]  [facility] (New Address!) [address], [address] [place], [place] [address] *Just off [employer], across the road from [place]* Modified hours of operation: Monday-Friday, 12 PM to 6 PM  Closed Saturday & Sunday  [facility]'s modified hours of operation will be in effect from May 1 until May 31   The following sites will take your insurance:  . [facility]  [phone] Get Driving Directions Find a Provider at this Location  [address] [place], [place] [address] . 10 am to 8 pm Monday-Friday . 12 pm to 8 pm Saturday-Sunday   . [facility]  [phone] Get  Driving Directions Find a Provider at this Location  [address], [address] [place], [place] [address] . 8 am to 8 pm Monday-Friday . 9 am to 6 pm Saturday . 11 am to 6 pm Sunday   . [facility] at [facility]  [phone] Get Driving Directions  [address] [place], [place] [address] . 8 am to 8 pm Monday-Friday . 8 am to 4 pm Saturday-Sunday   Your e-visit answers were reviewed by a board certified advanced clinical practitioner to complete your personal care plan.  Thank you for using e-Visits.

## 2019-04-24 ENCOUNTER — Other Ambulatory Visit: Payer: Self-pay

## 2019-04-24 MED ORDER — ALBUTEROL SULFATE HFA 108 (90 BASE) MCG/ACT IN AERS: 2.0000 | INHALATION_SPRAY | Freq: Four times a day (QID) | RESPIRATORY_TRACT | 0 refills | Status: DC | PRN

## 2019-04-24 NOTE — Telephone Encounter (Signed)
Courtesy refill  

## 2019-05-03 DIAGNOSIS — Z1389 Encounter for screening for other disorder: Secondary | ICD-10-CM | POA: Diagnosis not present

## 2019-05-03 DIAGNOSIS — Z6826 Body mass index (BMI) 26.0-26.9, adult: Secondary | ICD-10-CM | POA: Diagnosis not present

## 2019-05-03 DIAGNOSIS — E663 Overweight: Secondary | ICD-10-CM | POA: Diagnosis not present

## 2019-05-03 DIAGNOSIS — J310 Chronic rhinitis: Secondary | ICD-10-CM | POA: Diagnosis not present

## 2019-05-06 ENCOUNTER — Encounter: Payer: Self-pay | Admitting: Allergy & Immunology

## 2019-05-11 ENCOUNTER — Ambulatory Visit: Payer: Self-pay | Admitting: Pulmonary Disease

## 2019-05-16 ENCOUNTER — Encounter: Payer: Self-pay | Admitting: Orthopaedic Surgery

## 2019-05-16 ENCOUNTER — Ambulatory Visit: Payer: BLUE CROSS/BLUE SHIELD | Admitting: Orthopaedic Surgery

## 2019-05-19 ENCOUNTER — Other Ambulatory Visit: Payer: Self-pay

## 2019-05-19 ENCOUNTER — Ambulatory Visit: Payer: BLUE CROSS/BLUE SHIELD | Admitting: Allergy & Immunology

## 2019-05-19 ENCOUNTER — Encounter: Payer: Self-pay | Admitting: Allergy & Immunology

## 2019-05-19 VITALS — BP 120/82 | HR 84 | Resp 16 | Ht 68.0 in

## 2019-05-19 DIAGNOSIS — J4540 Moderate persistent asthma, uncomplicated: Secondary | ICD-10-CM | POA: Diagnosis not present

## 2019-05-19 DIAGNOSIS — J302 Other seasonal allergic rhinitis: Secondary | ICD-10-CM | POA: Diagnosis not present

## 2019-05-19 DIAGNOSIS — J3089 Other allergic rhinitis: Secondary | ICD-10-CM | POA: Diagnosis not present

## 2019-05-19 HISTORY — DX: Moderate persistent asthma, uncomplicated: J45.40

## 2019-05-19 MED ORDER — CARBINOXAMINE MALEATE 6 MG PO TABS: 6.0000 mg | ORAL_TABLET | Freq: Three times a day (TID) | ORAL | 6 refills | Status: DC | PRN

## 2019-05-19 MED ORDER — ALBUTEROL SULFATE HFA 108 (90 BASE) MCG/ACT IN AERS: 2.0000 | INHALATION_SPRAY | Freq: Four times a day (QID) | RESPIRATORY_TRACT | 0 refills | Status: DC | PRN

## 2019-05-19 MED ORDER — BUDESONIDE-FORMOTEROL FUMARATE 160-4.5 MCG/ACT IN AERO: 2.0000 | INHALATION_SPRAY | Freq: Two times a day (BID) | RESPIRATORY_TRACT | 5 refills | Status: DC

## 2019-05-19 MED ORDER — FLUTICASONE PROPIONATE 93 MCG/ACT NA EXHU: 2.0000 | INHALANT_SUSPENSION | Freq: Two times a day (BID) | NASAL | 5 refills | Status: DC

## 2019-05-19 MED ORDER — MONTELUKAST SODIUM 10 MG PO TABS: 10.0000 mg | ORAL_TABLET | Freq: Every day | ORAL | 11 refills | Status: DC

## 2019-05-19 NOTE — Progress Notes (Signed)
FOLLOW UP  Date of Service/Encounter:  05/19/19   Assessment:   Seasonal and perennial allergic rhinitis (dust mites, cat, dog, roach, trees, and ragweed)  Moderate persistent asthma, uncomplicated   Lindsey Jensen presents for follow-up visit.  She is having worsening throat clearing and dry cough.  She does have some postnasal drip occasionally and her cough is productive occasionally, so I think a lot of her symptoms now are related to uncontrolled postnasal drip.  In the past, we have treated her postnasal drip aggressively.  While she was on the nasal sprays on a routine basis, her cough did get better, but did not improve.  Her history is rather confusing as she tells me that the albuterol does help her cough.  She has a lot of questions regarding the diagnosis of asthma.  She has never undergone a methacholine challenge, which is the definitive method of diagnosing asthma.  I did discuss this procedure with her and she was not inclined to go through with this, mostly due to her history of anxiety.  This seems reasonable for the time being.  I think we can aggressively maximize her therapies for her allergic rhinitis as well as presumed asthma.  We will do a telephone visit in 4 to 6 weeks to see how she is doing with these therapies.   Plan/Recommendations:   1. Chronic rhinitis - We are going to try to maximize therapies and see how you do.  - Stop the Flonase and Zyrtec. - Start taking: Ryvent (carbinoxamine)  tablet 3-4 times daily as needed and Xhance (fluticasone) 1-2 sprays per nostril twice daily  - Samples of all of those provided. - Consider allergy shots for long term control.  2. ? Asthma - Stop the Qvar. - Spacer sample and demonstration provided. - Daily controller medication(s): Symbicort 160/4.79mcg two puffs twice daily with spacer - Prior to physical activity: albuterol 2 puffs 10-15 minutes before physical activity. - Rescue medications: albuterol 4 puffs every  4-6 hours as needed - Asthma control goals:  * Full participation in all desired activities (may need albuterol before activity) * Albuterol use two time or less a week on average (not counting use with activity) * Cough interfering with sleep two time or less a month * Oral steroids no more than once a year * No hospitalizations  3. Return in about 6 weeks (around 06/30/2019).   Subjective:   Lindsey Jensen is a 33 y.o. female presenting today for follow up of  Chief Complaint  Patient presents with  . Allergic Rhinitis   . Cough    [name] has a history of the following: Patient Active Problem List   Diagnosis Date Noted  . Smoker 10/07/2017  . Generalized anxiety disorder 08/23/2013    History obtained from: chart review and patient.  Lindsey Jensen is a 33 y.o. female presenting for a follow up visit.  She was last seen in November 2019.  At that time, we did blood work to look for environmental allergy testing.  We stopped her Zyrtec and started Allegra.  We continued Flonase 2 sprays per nostril daily and Astelin 2 sprays per nostril up to twice daily.  She was having a cough which had resolved with management of her postnasal drip.  In the interim, she did have labs drawn that showed sensitization to dust mites, cat, dog, roach, trees, and ragweed.  In the interim, she did message me at the beginning of April.  She was on  the Singulair and Qvar.  Her cough was doing really well until the end of March, when she sneezed and cough after working outside.  She treated this at home with Zyrtec to see if this made any difference.  She was requesting a refill on the albuterol since she seemed to be needing it more often.  She has stopped her fluticasone and the azelastine since she did not think that she needed them any longer.   She started working remotely in early March. She went back to work in mid May and she had difficulty breathing when she walked through the door. She tells me that she  has a lot of "crap in the back of [her] throat". She does have some wet cough but it was mostly dry. She has a lot of questions today rather this is actually asthma or if this is something else entirely. She has never undergone a methacholine challenge. She has never been on anything other than the Qvar. Now she is afraid that she is just crazy, which is a thought that her husband put into her head.   Otherwise, there have been no changes to her past medical history, surgical history, family history, or social history.    Review of Systems  Constitutional: Negative.  Negative for fever, malaise/fatigue and weight loss.  HENT: Positive for congestion. Negative for ear discharge, ear pain and sinus pain.        Positive for postnasal drip.  Eyes: Negative for pain, discharge and redness.  Respiratory: Positive for cough. Negative for sputum production, shortness of breath and wheezing.   Cardiovascular: Negative.  Negative for chest pain and palpitations.  Gastrointestinal: Negative for abdominal pain, heartburn, nausea and vomiting.  Skin: Negative.  Negative for itching and rash.  Neurological: Negative for dizziness and headaches.  Endo/Heme/Allergies: Negative for environmental allergies. Does not bruise/bleed easily.       Objective:   Blood pressure 120/82, pulse 84, resp. rate 16, height 5\' 8"  (1.727 m), SpO2 96 %, not currently breastfeeding. Body mass index is 27.31 kg/m.   Physical Exam:  Physical Exam  Constitutional: She appears well-developed.  HENT:  Head: Normocephalic and atraumatic.  Right Ear: External ear and ear canal normal. A middle ear effusion is present.  Left Ear: External ear and ear canal normal. A middle ear effusion is present.  Nose: Mucosal edema present. No rhinorrhea, nasal deformity or septal deviation. No epistaxis. Right sinus exhibits no maxillary sinus tenderness and no frontal sinus tenderness. Left sinus exhibits no maxillary sinus tenderness  and no frontal sinus tenderness.  Mouth/Throat: Uvula is midline and oropharynx is clear and moist. Mucous membranes are not pale and not dry.  Markedly enlarged turbinates bilaterally.  There is no epistaxis present.  She does have cobblestoning in the posterior oropharynx.  There is fluid present bilaterally behind the bilateral tympanic membranes.  Eyes: Pupils are equal, round, and reactive to light. Conjunctivae and EOM are normal. Right eye exhibits no chemosis and no discharge. Left eye exhibits no chemosis and no discharge. Right conjunctiva is not injected. Left conjunctiva is not injected.  Cardiovascular: Normal rate, regular rhythm and normal heart sounds.  Respiratory: Effort normal and breath sounds normal. No accessory muscle usage. No tachypnea. No respiratory distress. She has no wheezes. She has no rhonchi. She has no rales. She exhibits no tenderness.  No crackles or wheezes noted.  No increased work of breathing.  Lymphadenopathy:    She has no cervical adenopathy.  Neurological:  She is alert.  Skin: No abrasion, no petechiae and no rash noted. Rash is not papular, not vesicular and not urticarial. No erythema. No pallor.  Psychiatric: She has a normal mood and affect.     Diagnostic studies: none     Malachi Bonds, MD  [facility]

## 2019-05-19 NOTE — Patient Instructions (Addendum)
1. Chronic rhinitis - We are going to try to maximize therapies and see how you do.  - Stop the Flonase and Zyrtec. - Start taking: Ryvent (carbinoxamine) 6mg  tablet 3-4 times daily as needed and Xhance (fluticasone) 1-2 sprays per nostril twice daily  - Samples of all of those provided. - Consider allergy shots for long term control.  2. ? Asthma - Stop the Qvar. - Spacer sample and demonstration provided. - Daily controller medication(s): Symbicort 160/4.75mcg two puffs twice daily with spacer - Prior to physical activity: albuterol 2 puffs 10-15 minutes before physical activity. - Rescue medications: albuterol 4 puffs every 4-6 hours as needed - Asthma control goals:  * Full participation in all desired activities (may need albuterol before activity) * Albuterol use two time or less a week on average (not counting use with activity) * Cough interfering with sleep two time or less a month * Oral steroids no more than once a year * No hospitalizations  3. Return in about 6 weeks (around 06/30/2019).  Please inform us of any Emergency Department visits, hospitalizations, or changes in symptoms. Call us before going to the ED for breathing or allergy symptoms since we might be able to fit you in for a sick visit. Feel free to contact us anytime with any questions, problems, or concerns.  It was a pleasure to see you again today!  Websites that have reliable patient information: 1. American Academy of Asthma, Allergy, and Immunology: www.aaaai.org 2. Food Allergy Research and Education (FARE): foodallergy.org 3. Mothers of Asthmatics: http://www.asthmacommunitynetwork.org 4. American College of Allergy, Asthma, and Immunology: MissingWeapons.ca   Make sure you are registered to vote! If you have moved or changed any of your contact information, you will need to get this updated before voting!

## 2019-05-22 ENCOUNTER — Encounter: Payer: Self-pay | Admitting: Allergy & Immunology

## 2019-05-22 MED ORDER — PREDNISONE 10 MG PO TABS: ORAL_TABLET | ORAL | 0 refills | Status: DC

## 2019-05-22 MED ORDER — DOXYCYCLINE HYCLATE 100 MG PO CAPS: 100.0000 mg | ORAL_CAPSULE | Freq: Two times a day (BID) | ORAL | 0 refills | Status: AC

## 2019-05-24 ENCOUNTER — Other Ambulatory Visit: Payer: Self-pay | Admitting: Allergy & Immunology

## 2019-05-24 MED ORDER — ALBUTEROL SULFATE HFA 108 (90 BASE) MCG/ACT IN AERS: 2.0000 | INHALATION_SPRAY | Freq: Four times a day (QID) | RESPIRATORY_TRACT | 1 refills | Status: DC | PRN

## 2019-05-26 ENCOUNTER — Other Ambulatory Visit: Payer: Self-pay | Admitting: Pulmonary Disease

## 2019-05-26 ENCOUNTER — Telehealth: Payer: Self-pay | Admitting: Obstetrics & Gynecology

## 2019-05-26 DIAGNOSIS — E063 Autoimmune thyroiditis: Secondary | ICD-10-CM

## 2019-05-30 MED ORDER — OMEPRAZOLE 40 MG PO CPDR: 40.0000 mg | DELAYED_RELEASE_CAPSULE | Freq: Every day | ORAL | 1 refills | Status: DC

## 2019-05-30 NOTE — Addendum Note (Signed)
Addended by: Valentina Shaggy on: 05/30/2019 08:21 AM   Modules accepted: Orders

## 2019-06-02 ENCOUNTER — Telehealth: Payer: Self-pay | Admitting: *Deleted

## 2019-06-02 DIAGNOSIS — E063 Autoimmune thyroiditis: Secondary | ICD-10-CM | POA: Diagnosis not present

## 2019-06-02 NOTE — Telephone Encounter (Signed)
Error

## 2019-06-02 NOTE — Telephone Encounter (Signed)
I called the patent to discuss her symptoms more thoroughly. She denies white patches at all. She does have some nystatin on hand and could go ahead and start that if the white patches start.  She is open to this idea.  She does not think it is a side effect of the omeprazole and I tend to agree with her there.  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  She is afraid she might be developing another food allergy.  I recommended that she take note of foods that trigger the symptoms and we could do more targeted testing at the next visit.  She is in agreement with this plan.  Salvatore Marvel, MD [facility]

## 2019-06-03 ENCOUNTER — Other Ambulatory Visit: Payer: Self-pay | Admitting: Pulmonary Disease

## 2019-06-03 LAB — THYROID PANEL WITH TSH
Free Thyroxine Index: 2.3 (ref 1.2–4.9)
T3 Uptake Ratio: 31 % (ref 24–39)
T4, Total: 7.5 ug/dL (ref 4.5–12.0)
TSH: 1.1 u[IU]/mL (ref 0.450–4.500)

## 2019-06-03 LAB — THYROID PEROXIDASE ANTIBODY: Thyroperoxidase Ab SerPl-aCnc: 11 IU/mL (ref 0–34)

## 2019-06-05 ENCOUNTER — Telehealth: Payer: Self-pay | Admitting: Pulmonary Disease

## 2019-06-05 MED ORDER — QVAR REDIHALER 80 MCG/ACT IN AERB: 2.0000 | INHALATION_SPRAY | Freq: Two times a day (BID) | RESPIRATORY_TRACT | 0 refills | Status: DC

## 2019-06-05 NOTE — Telephone Encounter (Signed)
Returned call to patient. Qvar 80 had been taken off med list She says her rx refill had been denied. Added back and made pt aware she does need ov before next refill. 90 day sent Nothing further needed.

## 2019-06-07 MED ORDER — ALBUTEROL SULFATE HFA 108 (90 BASE) MCG/ACT IN AERS: 2.0000 | INHALATION_SPRAY | Freq: Four times a day (QID) | RESPIRATORY_TRACT | 1 refills | Status: DC | PRN

## 2019-06-07 NOTE — Addendum Note (Signed)
Addended by: Lucrezia Starch I on: 06/07/2019 10:25 AM   Modules accepted: Orders

## 2019-06-12 ENCOUNTER — Encounter: Payer: Self-pay | Admitting: Allergy & Immunology

## 2019-06-13 ENCOUNTER — Encounter (HOSPITAL_COMMUNITY): Payer: Self-pay

## 2019-06-13 ENCOUNTER — Emergency Department (HOSPITAL_COMMUNITY): Payer: BC Managed Care – PPO

## 2019-06-13 ENCOUNTER — Other Ambulatory Visit: Payer: Self-pay

## 2019-06-13 ENCOUNTER — Emergency Department (HOSPITAL_COMMUNITY)
Admission: EM | Admit: 2019-06-13 | Discharge: 2019-06-13 | Disposition: A | Discharge status: Home / Self Care | Payer: BC Managed Care – PPO | Attending: Emergency Medicine | Admitting: Emergency Medicine

## 2019-06-13 DIAGNOSIS — Z20828 Contact with and (suspected) exposure to other viral communicable diseases: Secondary | ICD-10-CM | POA: Insufficient documentation

## 2019-06-13 DIAGNOSIS — Z79899 Other long term (current) drug therapy: Secondary | ICD-10-CM | POA: Insufficient documentation

## 2019-06-13 DIAGNOSIS — J45909 Unspecified asthma, uncomplicated: Secondary | ICD-10-CM | POA: Insufficient documentation

## 2019-06-13 DIAGNOSIS — R109 Unspecified abdominal pain: Secondary | ICD-10-CM | POA: Diagnosis not present

## 2019-06-13 DIAGNOSIS — F17210 Nicotine dependence, cigarettes, uncomplicated: Secondary | ICD-10-CM | POA: Insufficient documentation

## 2019-06-13 DIAGNOSIS — R1010 Upper abdominal pain, unspecified: Secondary | ICD-10-CM

## 2019-06-13 DIAGNOSIS — R1011 Right upper quadrant pain: Secondary | ICD-10-CM | POA: Diagnosis not present

## 2019-06-13 DIAGNOSIS — R1013 Epigastric pain: Secondary | ICD-10-CM | POA: Diagnosis not present

## 2019-06-13 DIAGNOSIS — R079 Chest pain, unspecified: Secondary | ICD-10-CM | POA: Diagnosis not present

## 2019-06-13 LAB — CBC WITH DIFFERENTIAL/PLATELET
Abs Immature Granulocytes: 0.04 10*3/uL (ref 0.00–0.07)
Basophils Absolute: 0.1 10*3/uL (ref 0.0–0.1)
Basophils Relative: 1 %
Eosinophils Absolute: 0.1 10*3/uL (ref 0.0–0.5)
Eosinophils Relative: 1 %
HCT: 54.5 % — ABNORMAL HIGH (ref 36.0–46.0)
Hemoglobin: 17.5 g/dL — ABNORMAL HIGH (ref 12.0–15.0)
Immature Granulocytes: 0 %
Lymphocytes Relative: 23 %
Lymphs Abs: 2.1 10*3/uL (ref 0.7–4.0)
MCH: 29.9 pg (ref 26.0–34.0)
MCHC: 32.1 g/dL (ref 30.0–36.0)
MCV: 93 fL (ref 80.0–100.0)
Monocytes Absolute: 0.6 10*3/uL (ref 0.1–1.0)
Monocytes Relative: 7 %
Neutro Abs: 6.3 10*3/uL (ref 1.7–7.7)
Neutrophils Relative %: 68 %
Platelets: 219 10*3/uL (ref 150–400)
RBC: 5.86 MIL/uL — ABNORMAL HIGH (ref 3.87–5.11)
RDW: 17.1 % — ABNORMAL HIGH (ref 11.5–15.5)
WBC: 9.1 10*3/uL (ref 4.0–10.5)
nRBC: 0 % (ref 0.0–0.2)

## 2019-06-13 LAB — COMPREHENSIVE METABOLIC PANEL
ALT: 12 U/L (ref 0–44)
AST: 10 U/L — ABNORMAL LOW (ref 15–41)
Albumin: 3.9 g/dL (ref 3.5–5.0)
Alkaline Phosphatase: 51 U/L (ref 38–126)
Anion gap: 10 (ref 5–15)
BUN: 9 mg/dL (ref 6–20)
CO2: 26 mmol/L (ref 22–32)
Calcium: 8.9 mg/dL (ref 8.9–10.3)
Chloride: 102 mmol/L (ref 98–111)
Creatinine, Ser: 0.69 mg/dL (ref 0.44–1.00)
GFR calc Af Amer: >60 mL/min (ref >60)
GFR calc non Af Amer: >60 mL/min (ref >60)
Glucose, Bld: 99 mg/dL (ref 70–99)
Potassium: 3.6 mmol/L (ref 3.5–5.1)
Sodium: 138 mmol/L (ref 135–145)
Total Bilirubin: 0.5 mg/dL (ref 0.3–1.2)
Total Protein: 6.9 g/dL (ref 6.5–8.1)

## 2019-06-13 LAB — I-STAT BETA HCG BLOOD, ED (MC, WL, AP ONLY): I-stat hCG, quantitative: <5 m[IU]/mL (ref <5)

## 2019-06-13 LAB — LIPASE, BLOOD: Lipase: 25 U/L (ref 11–51)

## 2019-06-13 LAB — SARS CORONAVIRUS 2 BY RT PCR (HOSPITAL ORDER, PERFORMED IN ~~LOC~~ HOSPITAL LAB): SARS Coronavirus 2: NEGATIVE

## 2019-06-13 MED ORDER — PANTOPRAZOLE SODIUM 40 MG IV SOLR
40.0000 mg | Freq: Once | INTRAVENOUS | Status: DC
  Filled 2019-06-13: qty 40

## 2019-06-13 MED ORDER — SODIUM CHLORIDE 0.9 % IV BOLUS: 1000.0000 mL | Freq: Once | INTRAVENOUS | Status: DC

## 2019-06-13 MED ORDER — SODIUM CHLORIDE 0.9% FLUSH: 3.0000 mL | Freq: Once | INTRAVENOUS | Status: DC

## 2019-06-13 MED ORDER — HYDROMORPHONE HCL 1 MG/ML IJ SOLN
0.5000 mg | Freq: Once | INTRAMUSCULAR | Status: DC
  Filled 2019-06-13: qty 1

## 2019-06-13 MED ORDER — ONDANSETRON HCL 4 MG/2ML IJ SOLN
4.0000 mg | Freq: Once | INTRAMUSCULAR | Status: DC
  Filled 2019-06-13: qty 2

## 2019-06-13 NOTE — ED Notes (Addendum)
Pt does not want CT scan, pt states, "wants to go home and will f/u with primary doctor"- pt asking over and over why she needs CT scan when they took xray of her abdomen and didn't find anything, attempted several times to explain that was a chest xray and showed pt where her lungs are located. Pt continues to question IV access and CT scan order. Dr Roderic Palau at bedside talking with pt at this time.

## 2019-06-13 NOTE — ED Triage Notes (Signed)
Pt is having right lower quadrant abdominal pain that started last night. Is nauseous but has not vomited. HR in triage is 132. Temp of 99.3

## 2019-06-13 NOTE — Discharge Instructions (Addendum)
Radiology should contact you tomorrow and tell you when to come in for your ultrasound.  Return to the emergency department if you get worse.  Otherwise take Tylenol or Motrin for the discomfort

## 2019-06-13 NOTE — ED Notes (Signed)
Pt refused medications, states she is not in that much pain right now and has already took her omeprazole today. Pt also does not want IV until Dr orders CT scan and she "must have one". Dr Roderic Palau aware.

## 2019-06-13 NOTE — ED Provider Notes (Signed)
[facility] EMERGENCY DEPARTMENT Provider Note   CSN: [id number] Arrival date & time: 06/13/19  1542     History   Chief Complaint Chief Complaint  Patient presents with  . Abdominal Pain    HPI Colon [name] is a 33 y.o. female.      Patient complains of abdominal pain.  Pain came on all of a sudden in his right upper quadrant  The history is provided by the patient. No language interpreter was used.  Abdominal Pain Pain location:  Epigastric Pain quality: aching   Pain radiates to:  Does not radiate Pain severity:  Moderate Onset quality:  Sudden Timing:  Constant Progression:  Worsening Chronicity:  New Context: not alcohol use   Associated symptoms: no chest pain, no cough, no diarrhea, no fatigue and no hematuria     Past Medical History:  Diagnosis Date  . Contraceptive education 01/26/2014  . Medical history non-contributory   . Moderate persistent asthma, uncomplicated 05/19/2019    Patient Active Problem List   Diagnosis Date Noted  . Seasonal and perennial allergic rhinitis 05/19/2019  . Moderate persistent asthma, uncomplicated 05/19/2019  . Smoker 10/07/2017  . Generalized anxiety disorder 08/23/2013    Past Surgical History:  Procedure Laterality Date  . NO PAST SURGERIES       OB History    Gravida  1   Para  1   Term  1   Preterm  0   AB  0   Living  1     SAB  0   TAB  0   Ectopic  0   Multiple  0   Live Births  1            Home Medications    Prior to Admission medications   Medication Sig Start Date End Date Taking? Authorizing Provider  albuterol (PROVENTIL HFA) 108 (90 Base) MCG/ACT inhaler Inhale 2 puffs into the lungs every 6 (six) hours as needed for wheezing or shortness of breath. 06/07/19   Alfonse SpruceGallagher, Joel Louis, MD  beclomethasone (QVAR REDIHALER) 80 MCG/ACT inhaler Inhale 2 puffs into the lungs 2 (two) times daily. 06/05/19   Tomma Lightninglalere, Adewale A, MD  budesonide-formoterol (SYMBICORT) 160-4.5 MCG/ACT  inhaler Inhale 2 puffs into the lungs 2 (two) times daily. 05/19/19   Alfonse SpruceGallagher, Joel Louis, MD  Carbinoxamine Maleate (RYVENT) 6 MG TABS Take 6 mg by mouth every 8 (eight) hours as needed (3-4 times daily PRN). 05/19/19   Alfonse SpruceGallagher, Joel Louis, MD  Fluticasone Propionate Timmothy Sours(XHANCE) 93 MCG/ACT EXHU Place 2 sprays into the nose 2 (two) times a day. 05/19/19   Alfonse SpruceGallagher, Joel Louis, MD  montelukast (SINGULAIR) 10 MG tablet Take 1 tablet (10 mg total) by mouth at bedtime. 05/19/19   Alfonse SpruceGallagher, Joel Louis, MD  nystatin (MYCOSTATIN) 100000 UNIT/ML suspension Take 5 mLs (500,000 Units total) by mouth every 6 (six) hours. Swish around in mouth and try to keep in mouth as long as possible before spitting it out. 03/23/19   Coral CeoMack, Brian P, NP  omeprazole (PRILOSEC) 40 MG capsule Take 1 capsule (40 mg total) by mouth daily for 30 days. 05/30/19 06/29/19  Alfonse SpruceGallagher, Joel Louis, MD  predniSONE (DELTASONE) 10 MG tablet Take two tablets (20mg ) twice daily for three days, then one tablet (10mg ) twice daily for three days, then STOP. 05/22/19   Alfonse SpruceGallagher, Joel Louis, MD    Family History Family History  Problem Relation Age of Onset  . Heart disease Father  heart attack  . Diabetes Mother   . Depression Mother   . Anxiety disorder Mother   . Heart disease Mother   . Heart disease Maternal Grandmother        CHF  . Alzheimer's disease Maternal Grandfather     Social History Social History   Tobacco Use  . Smoking status: Current Every Day Smoker    Packs/day: 0.50    Types: Cigarettes  . Smokeless tobacco: Never Used  Substance Use Topics  . Alcohol use: No    Alcohol/week: 14.0 standard drinks    Types: 14 Glasses of wine per week    Frequency: Never    Comment: glass of wine every night or every other night   . Drug use: No     Allergies   Patient has no known allergies.   Review of Systems Review of Systems  Constitutional: Negative for appetite change and fatigue.  HENT: Negative for  congestion, ear discharge and sinus pressure.   Eyes: Negative for discharge.  Respiratory: Negative for cough.   Cardiovascular: Negative for chest pain.  Gastrointestinal: Positive for abdominal pain. Negative for diarrhea.  Genitourinary: Negative for frequency and hematuria.  Musculoskeletal: Negative for back pain.  Skin: Negative for rash.  Neurological: Negative for seizures and headaches.  Psychiatric/Behavioral: Negative for hallucinations.     Physical Exam Updated Vital Signs BP (!) 128/91   Pulse 84   Temp 99.3 F (37.4 C) (Oral)   Resp 17   Ht 5\' 8"  (1.727 m)   Wt 79.4 kg   LMP 06/12/2019   SpO2 100%   BMI 26.61 kg/m   Physical Exam Vitals signs and nursing note reviewed.  Constitutional:      Appearance: She is well-developed.  HENT:     Head: Normocephalic.     Nose: Nose normal.  Eyes:     General: No scleral icterus.    Conjunctiva/sclera: Conjunctivae normal.  Neck:     Musculoskeletal: Neck supple.     Thyroid: No thyromegaly.  Cardiovascular:     Rate and Rhythm: Normal rate and regular rhythm.     Heart sounds: No murmur. No friction rub. No gallop.   Pulmonary:     Breath sounds: No stridor. No wheezing or rales.  Chest:     Chest wall: No tenderness.  Abdominal:     General: There is no distension.     Tenderness: There is abdominal tenderness. There is no rebound.  Musculoskeletal: Normal range of motion.  Lymphadenopathy:     Cervical: No cervical adenopathy.  Skin:    Findings: No erythema or rash.  Neurological:     Mental Status: She is oriented to person, place, and time.     Motor: No abnormal muscle tone.     Coordination: Coordination normal.  Psychiatric:        Behavior: Behavior normal.      ED Treatments / Results  Labs (all labs ordered are listed, but only abnormal results are displayed) Labs Reviewed  COMPREHENSIVE METABOLIC PANEL - Abnormal; Notable for the following components:      Result Value   AST 10  (*)    All other components within normal limits  CBC WITH DIFFERENTIAL/PLATELET - Abnormal; Notable for the following components:   RBC 5.86 (*)    Hemoglobin 17.5 (*)    HCT 54.5 (*)    RDW 17.1 (*)    All other components within normal limits  SARS CORONAVIRUS 2 (HOSPITAL ORDER, PERFORMED  [facility])  LIPASE, BLOOD  URINALYSIS, ROUTINE W REFLEX MICROSCOPIC  I-STAT BETA HCG BLOOD, ED ([facility], [facility], [facility] ONLY)    EKG    Radiology Dg Chest Portable 1 View  Result Date: 06/13/2019 CLINICAL DATA:  33 year old female with chest pain. EXAM: PORTABLE CHEST 1 VIEW COMPARISON:  Chest CT dated 02/03/2019 FINDINGS: The heart size and mediastinal contours are within normal limits. Both lungs are clear. The visualized skeletal structures are unremarkable. IMPRESSION: No active disease. Electronically Signed   By: Anner Crete M.D.   On: 06/13/2019 19:47    Procedures Procedures (including critical care time)  Medications Ordered in ED Medications  sodium chloride flush (NS) 0.9 % injection 3 mL (3 mLs Intravenous Not Given 06/13/19 2018)  sodium chloride 0.9 % bolus 1,000 mL (1,000 mLs Intravenous Refused 06/13/19 2057)  pantoprazole (PROTONIX) injection 40 mg (40 mg Intravenous Refused 06/13/19 2019)  HYDROmorphone (DILAUDID) injection 0.5 mg (0.5 mg Intravenous Refused 06/13/19 2019)  ondansetron (ZOFRAN) injection 4 mg (4 mg Intravenous Refused 06/13/19 2019)     Initial Impression / Assessment and Plan / ED Course  I have reviewed the triage vital signs and the nursing notes.  Pertinent labs & imaging results that were available during my care of the patient were reviewed by me and considered in my medical decision making (see chart for details).        Labs unremarkable.  Patient's pain has improved.  She does not want a CAT scan.  We will arrange for her to get an ultrasound look at her gallbladder in the next couple days.  She will return if any problems.  Patient is  taking Prilosec already and does not want pain medicine  Final Clinical Impressions(s) / ED Diagnoses   Final diagnoses:  Pain of upper abdomen    ED Discharge Orders         Ordered    US Abdomen Complete     06/13/19 2105           Milton Ferguson, MD 06/13/19 2108

## 2019-06-16 ENCOUNTER — Ambulatory Visit: Payer: BC Managed Care – PPO | Admitting: Family Medicine

## 2019-06-16 ENCOUNTER — Encounter: Payer: Self-pay | Admitting: Family Medicine

## 2019-06-16 ENCOUNTER — Other Ambulatory Visit: Payer: Self-pay

## 2019-06-16 VITALS — BP 108/72 | HR 92 | Temp 98.2°F | Resp 18

## 2019-06-16 DIAGNOSIS — J3089 Other allergic rhinitis: Secondary | ICD-10-CM

## 2019-06-16 DIAGNOSIS — R05 Cough: Secondary | ICD-10-CM | POA: Diagnosis not present

## 2019-06-16 DIAGNOSIS — R059 Cough, unspecified: Secondary | ICD-10-CM

## 2019-06-16 DIAGNOSIS — J4540 Moderate persistent asthma, uncomplicated: Secondary | ICD-10-CM

## 2019-06-16 DIAGNOSIS — B370 Candidal stomatitis: Secondary | ICD-10-CM

## 2019-06-16 DIAGNOSIS — J302 Other seasonal allergic rhinitis: Secondary | ICD-10-CM

## 2019-06-16 DIAGNOSIS — Z720 Tobacco use: Secondary | ICD-10-CM

## 2019-06-16 MED ORDER — FLUCONAZOLE 100 MG PO TABS: 100.0000 mg | ORAL_TABLET | Freq: Every day | ORAL | 0 refills | Status: AC

## 2019-06-16 NOTE — Patient Instructions (Addendum)
1. Chronic rhinitis - Continue cetirizine 10 mg once a day for a runny nose or itch - Continue Flonase 2 sprays in each nostril once a day for a stuffy nose - Continue saline nasal rinses. Use this before any nasal sprays.  - Consider allergy shots for long term control.  2. ? Asthma - Daily controller medication(s): Qvar 80-2 puffs twice a day - Prior to physical activity: albuterol 2 puffs 10-15 minutes before physical activity. - Rescue medications: albuterol 4 puffs every 4-6 hours as needed - Asthma control goals:  * Full participation in all desired activities (may need albuterol before activity) * Albuterol use two time or less a week on average (not counting use with activity) * Cough interfering with sleep two time or less a month * Oral steroids no more than once a year * No hospitalizations  3. Oral candidiasis - Begin fluconazole 100 mg once a day for 7 days  4. Tobacco - Stop smoking or cut down the amount you are smoking (information given)  5. Follow up in 2 months or sooner if needed  Please inform us of any Emergency Department visits, hospitalizations, or changes in symptoms. Call us before going to the ED for breathing or allergy symptoms since we might be able to fit you in for a sick visit. Feel free to contact us anytime with any questions, problems, or concerns.  It was a pleasure to meet you today!  Websites that have reliable patient information: 1. American Academy of Asthma, Allergy, and Immunology: www.aaaai.org 2. Food Allergy Research and Education (FARE): foodallergy.org 3. Mothers of Asthmatics: http://www.asthmacommunitynetwork.org 4. American College of Allergy, Asthma, and Immunology: www.acaai.org

## 2019-06-17 ENCOUNTER — Encounter: Payer: Self-pay | Admitting: Family Medicine

## 2019-06-17 DIAGNOSIS — B370 Candidal stomatitis: Secondary | ICD-10-CM | POA: Insufficient documentation

## 2019-06-17 DIAGNOSIS — R059 Cough, unspecified: Secondary | ICD-10-CM | POA: Insufficient documentation

## 2019-06-17 DIAGNOSIS — R05 Cough: Secondary | ICD-10-CM | POA: Insufficient documentation

## 2019-06-17 NOTE — Progress Notes (Signed)
[address] [place] [address] Dept: [phone]  FOLLOW UP NOTE  Patient ID: Lindsey Jensen, female    DOB: 08-13-1986  Age: 33 y.o. MRN: [id number] Date of Office Visit: 06/16/2019  Assessment  Chief Complaint: Ritta Slot (wonders if she may be developing thrush due to inhaler use. she believes that because she is more aware of the issue then she is more aware of the symptoms. )  HPI Lindsey Jensen is a 33 year old female who presents to the clinic for an acute problem. She reports that about 3 weeks ago she began to experience a sore throat with white patches in the back of her throat. She continues using Quar 80-2 puffs twice a day for a cough and reports brushing her teeth, rinsing her mouth, and spitting after each use. She reports her asthma as well controlled with no shortness of breath or wheeze. She reports moderately well controlled allergic rhinitis with symptoms including throat clearing and cough which is mostly dry and occasionally produces clear mucus. She continues Flonase and cetirizine daily. She reports that she is not using Xhance as it was not effective for her. She also continues saline nasal rinses daily. She continues to smoke cigarettes daily. Her medications are listed in the chart.    Drug Allergies:  No Known Allergies  Physical Exam: BP 108/72 (BP Location: Left Arm, Patient Position: Sitting, Cuff Size: Normal)   Pulse 92   Temp 98.2 F (36.8 C) (Temporal)   Resp 18   LMP 06/12/2019   SpO2 98%    Physical Exam Vitals signs reviewed.  Constitutional:      Appearance: Normal appearance.  HENT:     Head: Normocephalic.     Right Ear: Tympanic membrane normal.     Left Ear: Tympanic membrane normal.     Nose:     Comments: bilateral nares slightly erythematous with clear nasal drainage noted. Pharynx erythematous with white patches on the right tonsillar area. Ears normal. Eyes normal. Eyes:     Conjunctiva/sclera: Conjunctivae normal.  Neck:   Musculoskeletal: Normal range of motion and neck supple.  Cardiovascular:     Rate and Rhythm: Normal rate and regular rhythm.     Heart sounds: Normal heart sounds. No murmur.  Pulmonary:     Effort: Pulmonary effort is normal.     Breath sounds: Normal breath sounds.     Comments: Lungs clear to auscultation Musculoskeletal: Normal range of motion.  Skin:    General: Skin is warm and dry.  Neurological:     Mental Status: She is alert and oriented to person, place, and time.  Psychiatric:        Mood and Affect: Mood normal.        Behavior: Behavior normal.        Thought Content: Thought content normal.        Judgment: Judgment normal.     Assessment and Plan: 1. Oral candida   2. Seasonal and perennial allergic rhinitis   3. Cough   4. Moderate persistent asthma, uncomplicated   5. Tobacco use     Meds ordered this encounter  Medications  . fluconazole (DIFLUCAN) 100 MG tablet    Sig: Take 1 tablet (100 mg total) by mouth daily for 7 days.    Dispense:  7 tablet    Refill:  0    Patient Instructions  1. Chronic rhinitis - Continue cetirizine 10 mg once a day for a runny nose  or itch - Continue Flonase 2 sprays in each nostril once a day for a stuffy nose - Continue saline nasal rinses. Use this before any nasal sprays.  - Consider allergy shots for long term control.  2. ? Asthma - Daily controller medication(s): Qvar 80-2 puffs twice a day - Prior to physical activity: albuterol 2 puffs 10-15 minutes before physical activity. - Rescue medications: albuterol 4 puffs every 4-6 hours as needed - Asthma control goals:  * Full participation in all desired activities (may need albuterol before activity) * Albuterol use two time or less a week on average (not counting use with activity) * Cough interfering with sleep two time or less a month * Oral steroids no more than once a year * No hospitalizations  3. Oral candidiasis - Begin fluconazole 100 mg once a day  for 7 days  4. Tobacco - Stop smoking or cut down the amount you are smoking (information given)  5. Follow up in 2 months or sooner if needed   Return in about 2 months (around 08/16/2019), or if symptoms worsen or fail to improve.    Thank you for the opportunity to care for this patient.  Please do not hesitate to contact me with questions.  Thermon LeylandAnne Ambs, FNP [facility]

## 2019-06-19 ENCOUNTER — Other Ambulatory Visit: Payer: Self-pay

## 2019-06-19 ENCOUNTER — Other Ambulatory Visit: Payer: Self-pay | Admitting: Internal Medicine

## 2019-06-19 DIAGNOSIS — Z20822 Contact with and (suspected) exposure to covid-19: Secondary | ICD-10-CM

## 2019-06-22 ENCOUNTER — Telehealth: Payer: Self-pay | Admitting: Allergy & Immunology

## 2019-06-22 LAB — NOVEL CORONAVIRUS, NAA: SARS-CoV-2, NAA: NOT DETECTED

## 2019-06-22 MED ORDER — PREDNISONE 10 MG PO TABS: ORAL_TABLET | ORAL | 0 refills | Status: DC

## 2019-06-22 NOTE — Telephone Encounter (Signed)
Prednisone course sent in.  Salvatore Marvel, MD [facility] [place] [facility] [place]

## 2019-06-22 NOTE — Telephone Encounter (Signed)
Dr. Gallagher please advise.  

## 2019-06-22 NOTE — Telephone Encounter (Signed)
Patient is on vacation at her parents house in [place]. She said as soon as she got there yesterday and laid down in her childhood bed, she immediately started having a reaction. She was wheezing, had watery eyes and a barking cough. She is requesting that Prednisone be sent in to a pharmacy in [place] [address], [place], [place].

## 2019-06-25 ENCOUNTER — Other Ambulatory Visit: Payer: Self-pay | Admitting: Allergy & Immunology

## 2019-06-30 ENCOUNTER — Ambulatory Visit (HOSPITAL_COMMUNITY)
Admission: RE | Admit: 2019-06-30 | Discharge: 2019-06-30 | Disposition: A | Discharge status: Home / Self Care | Payer: BC Managed Care – PPO | Source: Ambulatory Visit | Attending: Family Medicine | Admitting: Family Medicine

## 2019-06-30 ENCOUNTER — Other Ambulatory Visit: Payer: Self-pay

## 2019-06-30 ENCOUNTER — Other Ambulatory Visit: Payer: Self-pay | Admitting: Family Medicine

## 2019-06-30 ENCOUNTER — Other Ambulatory Visit (HOSPITAL_COMMUNITY): Payer: Self-pay | Admitting: Family Medicine

## 2019-06-30 DIAGNOSIS — M79604 Pain in right leg: Secondary | ICD-10-CM | POA: Insufficient documentation

## 2019-06-30 DIAGNOSIS — M79661 Pain in right lower leg: Secondary | ICD-10-CM | POA: Diagnosis not present

## 2019-07-02 ENCOUNTER — Encounter: Payer: Self-pay | Admitting: Allergy & Immunology

## 2019-07-04 ENCOUNTER — Telehealth: Payer: Self-pay | Admitting: *Deleted

## 2019-07-04 NOTE — Telephone Encounter (Signed)
Message forwarded to Dr. Ernst Bowler.

## 2019-07-04 NOTE — Telephone Encounter (Signed)
Mailed out immunotherapy info

## 2019-07-05 ENCOUNTER — Ambulatory Visit: Payer: BLUE CROSS/BLUE SHIELD | Admitting: Cardiovascular Disease

## 2019-07-05 NOTE — Telephone Encounter (Signed)
Called the patient to schedule an appointment and she wanted to confirm which allergens would be in her vials since she has a lot of allergens that she is very allergic too. Please advise. When we call back to inform her we will schedule her injection appt.

## 2019-07-05 NOTE — Telephone Encounter (Signed)
I included everything she was allergic to on her environmental allergy panel from November 2019.  This includes dust mite, cat, dog, cockroach, trees, and ragweed.  I did push the dog little higher than we normally would because it was her highest allergen and she lives with a dog so wanted to be sure to cover that allergen the most.  Salvatore Marvel, MD [facility] [place] [facility]

## 2019-07-05 NOTE — Progress Notes (Signed)
We received notification from Lindsey Jensen that she is interested in pursuing allergen immunotherapy. Prescriptions written and routed to the Immunotherapy Team.    Salvatore Marvel, MD  [facility] [place] [facility]

## 2019-07-05 NOTE — Telephone Encounter (Signed)
Addressed in separate encounter. She is interested in starting allergen immunotherapy. I will put a script in and route to the Immunotherapy Team. Can you call to make a start injection appointment for her? She is a [place] patient but I believe that she lives in [place], so either [place] or [place] could work.   Salvatore Marvel, MD [facility] [place] [facility] [place]

## 2019-07-05 NOTE — Addendum Note (Signed)
Addended by: Valentina Shaggy on: 07/05/2019 11:24 AM   Modules accepted: Orders

## 2019-07-05 NOTE — Telephone Encounter (Signed)
Patient verbalized understanding and has been placed on the schedule to receive her first allergy injections on 07/20/2019. She will receive her first and maybe second injection in the [place] office and then switch to [place].

## 2019-07-06 DIAGNOSIS — J3081 Allergic rhinitis due to animal (cat) (dog) hair and dander: Secondary | ICD-10-CM | POA: Diagnosis not present

## 2019-07-06 NOTE — Progress Notes (Signed)
VIALS EXP 07-05-2020 

## 2019-07-10 ENCOUNTER — Other Ambulatory Visit: Payer: Self-pay | Admitting: Family Medicine

## 2019-07-10 ENCOUNTER — Telehealth: Payer: Self-pay | Admitting: *Deleted

## 2019-07-10 DIAGNOSIS — J3089 Other allergic rhinitis: Secondary | ICD-10-CM | POA: Diagnosis not present

## 2019-07-10 NOTE — Telephone Encounter (Signed)
Received PA request for quantity limit for omeprazole. Contacted pharmacy and they state they are unsure why this is needed since this medication is preferred under patients plan. Contacted patient advised to call insurance as this medication may be required as a mail order states she will call insurance tomorrow to set this up and call us with any questions

## 2019-07-20 ENCOUNTER — Ambulatory Visit (INDEPENDENT_AMBULATORY_CARE_PROVIDER_SITE_OTHER): Payer: BC Managed Care – PPO

## 2019-07-20 ENCOUNTER — Telehealth: Payer: Self-pay | Admitting: *Deleted

## 2019-07-20 DIAGNOSIS — J302 Other seasonal allergic rhinitis: Secondary | ICD-10-CM | POA: Diagnosis not present

## 2019-07-20 DIAGNOSIS — J3089 Other allergic rhinitis: Secondary | ICD-10-CM

## 2019-07-20 MED ORDER — EPINEPHRINE 0.3 MG/0.3ML IJ SOAJ: INTRAMUSCULAR | 2 refills | Status: DC

## 2019-07-20 NOTE — Telephone Encounter (Signed)
Notified patient.

## 2019-07-20 NOTE — Progress Notes (Signed)
Immunotherapy   Patient Details  Name: Lindsey Jensen MRN: [id number] Date of Birth: May 31, 1986  07/20/2019  Lindsey Jensen started injections for  T/C/D & DM/CR/RW Following schedule: B  Frequency:2 times per week Epi-Pen:Epi-Pen Available  Consent signed and patient instructions given.   Lindsey Jensen 07/20/2019, 1:42 PM

## 2019-07-20 NOTE — Telephone Encounter (Signed)
Epipen sent in. Called patient no answer voicemail not set up.

## 2019-07-20 NOTE — Telephone Encounter (Signed)
Pt starts allergy injections today and needs a prescritpion called in for an EpiPen. Pharmacy is [facility] [address] in [place]. Please call patient once this has been done.

## 2019-07-21 ENCOUNTER — Other Ambulatory Visit: Payer: Self-pay

## 2019-07-21 ENCOUNTER — Encounter: Payer: Self-pay | Admitting: Allergy & Immunology

## 2019-07-21 ENCOUNTER — Ambulatory Visit: Payer: BLUE CROSS/BLUE SHIELD | Admitting: Allergy & Immunology

## 2019-07-21 ENCOUNTER — Telehealth: Payer: Self-pay | Admitting: Allergy

## 2019-07-21 ENCOUNTER — Ambulatory Visit (INDEPENDENT_AMBULATORY_CARE_PROVIDER_SITE_OTHER): Payer: BC Managed Care – PPO | Admitting: Allergy & Immunology

## 2019-07-21 DIAGNOSIS — T782XXD Anaphylactic shock, unspecified, subsequent encounter: Secondary | ICD-10-CM

## 2019-07-21 NOTE — Progress Notes (Signed)
RE: Lindsey [name] MRN: [id number] DOB: 08/25/1986 Date of Telemedicine Visit: 07/21/2019  Referring provider: Elfredia NevinsFusco, [place], MD Primary care provider: Elfredia NevinsFusco, [place], MD  Chief Complaint: Medication Management (Televisit at home. Patient gave verbal consent to treat and bill insurance for this visit.)   Telemedicine Follow Up Visit via Telephone: I connected with Lindsey PlaterGina Jensen for a follow up on 07/22/19 by telephone and verified that I am speaking with the correct person using two identifiers.   I discussed the limitations, risks, security and privacy concerns of performing an evaluation and management service by telephone and the availability of in person appointments. I also discussed with the patient that there may be a patient responsible charge related to this service. The patient expressed understanding and agreed to proceed.  Patient is at work.  Provider is at the office.  Visit start time: 10:30 AM Visit end time: 11:09 AM [facility] Medical consent and medical assistant/nurse: Morrie SheldonAshley  History of Present Illness:  She is a 33 y.o. female, who is being followed for a questionable history of asthma as well as a chronic cough and P/SAR with postnasal drip. Her previous allergy office visit was in June 2020 with Thermon LeylandAnne Ambs, FNP.  At that visit, she was complaining of 3 weeks of a sore throat with white patches in the back of her throat.  She remains on Qvar 80 mcg 2 puffs twice daily for the cough and despite rinsing her mouth out and brushing her teeth, she seemed to have developed oral candidiasis.  She was started on fluconazole 100 mg daily for 7 days.  She was continued on the Qvar 2 puffs twice daily with albuterol as needed.  For her rhinitis, she was continued on cetirizine 10 mg daily and Flonase 2 sprays per nostril daily.  In the interim, she reached out to me to express interest in starting allergen immunotherapy.  We did have some MyChart messages  back-and-forth about the process and we sent her information about immunotherapy.  She did make the decision to go ahead and start.  She received her first injection yesterday and did fine during the wait period. She did fine after that. She did have her EpiPen with her. S- tthe was on the way home and during that time she just felt "exhausted". She literally felt like she needed to pull over. She got back to work in [place]. She got a cup of coffee at work and then developed thick mucous in the back of her throat. She also felt that she needed to clear her throat. She then started to feel some pressure in her abdomen. Her lungs started getting somewhat tight. She felt that she needed to "take a deep breath" when she was taking a deep breath. She also reports that she was developed abdominal tightness.   She did consult her sheet at this point. She did not know whether to use her EpiPen. Then with all of the confusion, she also had a sense of doom come over her. She was told by the on-call MD to take two Benadryl but she refused since she did not want to be "hung over" from this. She was also told to take four puffs of albuterol, which she did not want to do since her baseline HR is around 110 anything and with one puff of albuterol, her HR goes into the 140s. She is not on Xopenex.   She is feeling good this morning and back to her  baseline. She feels that she was not adequately prepared for anaphylaxis, although she does tell me that Lonn Georgia did a great job with explaining everything to her and cannot praise her enough during the phone call today.   She is very concerned that she had a reaction with such a low dose. She has baseline anxiety but nothing out of the ordinary. This was her baseline anxiety. She is not on anything anxiety. She tells me that many of her doctors and her family and friends attribute her symptoms to anxiety. However, prior to the onset of the cough in October 2020, she did not  have any of these problems.   She had a reaction when she was in her parent's attic. She took prednisone and she was fine after that. This is when we discussed allergy shots again. She does not feel that she was adequately prepared for allergy shots.   Otherwise, there have been no changes to her past medical history, surgical history, family history, or social history.  Assessment and Plan:  Lindsey Jensen is a 34 y.o. 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 Perennial and seasonal allergic rhinitis (dust mites, cat, dog, roach, trees, ragweed) - We are going to change you to the Silver Vial and change you to Schedule A. - For the time being, we are going to maximize your premedication regimen (this is not a long term regimen): prednisone 10mg , Allegra 360mg  (two tablets), Singulair 10mg  - Continue Allegra 180mg  once a day for a runny nose or itch - Continue Flonase 2 sprays in each nostril once a day for a stuffy nose - Continue saline nasal rinses, to be used before fluticasone.   2. ? Asthma - The definitive diagnosis of asthma involves a methacholine challenge. - We will hold off on doing that due to patient preference. - We are going to change to Xopenex due to tachycardia.  - Daily controller medication(s): Symbicort 160/4.8mcg two puffs twice daily with spacer - Prior to physical activity: albuterol 2 puffs 10-15 minutes before physical activity. - Rescue medications: albuterol 4 puffs every 4-6 hours as needed - Asthma control goals:  * Full participation in all desired activities (may need albuterol before activity) * Albuterol use two time or less a week on average (not counting use with activity) * Cough interfering with sleep two time or less  a month * Oral steroids no more than once a year * No hospitalizations  3. Return in about 2 months (around 09/20/2019). This can be an in-person, a virtual Webex or a telephone follow up visit.     Diagnostics: None.  Medication List:  Current Outpatient Medications  Medication Sig Dispense Refill  . albuterol (PROVENTIL HFA) 108 (90 Base) MCG/ACT inhaler Inhale 2 puffs into the lungs every 6 (six) hours as needed for wheezing or shortness of breath. 1 Inhaler 1  . azelastine (ASTELIN) 0.1 % nasal spray INHALE 2 SPRAYS EACH NOSTRIL 1 2 TIMES DAILY AS NEEDED.    Marland Kitchen beclomethasone (QVAR REDIHALER) 80 MCG/ACT inhaler Inhale 2 puffs into the lungs 2 (two) times daily. 31.8 g 0  . EPINEPHrine (EPIPEN 2-PAK) 0.3 mg/0.3 mL IJ SOAJ injection Use as directed for severe allergic reaction 2 each 2  . fluticasone (FLONASE) 50 MCG/ACT nasal spray SPRAY 2 SPRAYS  INTO EACH NOSTRIL EVERY DAY    . omeprazole (PRILOSEC) 40 MG capsule TAKE 1 CAPSULE BY MOUTH EVERY DAY (Patient not taking: Reported on 07/21/2019) 90 capsule 1  . predniSONE (DELTASONE) 10 MG tablet Take two tablets (20mg ) twice daily for three days, then one tablet (10mg ) twice daily for three days, then STOP. (Patient not taking: Reported on 07/21/2019) 18 tablet 0   No current facility-administered medications for this visit.    Allergies: No Known Allergies I reviewed her past medical history, social history, family history, and environmental history and no significant changes have been reported from previous visits.  Review of Systems  Constitutional: Negative for activity change, appetite change, chills, fatigue, fever and unexpected weight change.  HENT: Negative for congestion, postnasal drip, rhinorrhea, sinus pressure and sore throat.   Eyes: Negative for pain, discharge, redness and itching.  Respiratory: Positive for shortness of breath. Negative for wheezing and stridor.   Gastrointestinal: Negative for diarrhea, nausea and  vomiting.  Musculoskeletal: Negative for arthralgias, joint swelling and myalgias.  Skin: Negative for rash.  Allergic/Immunologic: Negative for environmental allergies and food allergies.    Objective:  Physical exam not obtained as encounter was done via telephone.   Previous notes and tests were reviewed.  I discussed the assessment and treatment plan with the patient. The patient was provided an opportunity to ask questions and all were answered. The patient agreed with the plan and demonstrated an understanding of the instructions.   The patient was advised to call back or seek an in-person evaluation if the symptoms worsen or if the condition fails to improve as anticipated.  I provided 39 minutes of non-face-to-face time during this encounter.  It was my pleasure to participate in [place]'s care today. Please feel free to contact me with any questions or concerns.   Sincerely,  Alfonse SpruceJoel Louis Ziva Nunziata, MD

## 2019-07-21 NOTE — Telephone Encounter (Signed)
I spoke with Rick this morning. She is doing well but has many concerns about the allergy injections and her diagnosis of asthma. I scheduled a televisit with Dr. Ernst Bowler today. I also explained to her the emergency plan in detail and let her know what symptoms may warrant epinephrine versus symptoms that may warrant benadryl. I have verbally briefed Dr. Ernst Bowler on the telephone call as well.

## 2019-07-21 NOTE — Telephone Encounter (Signed)
We had a long televisit this morning to go over things.   Lindsey Marvel, MD [facility] [place] [facility]

## 2019-07-21 NOTE — Telephone Encounter (Signed)
Please call patient and follow up to see how she is doing today.   She got her first injection yesterday and no reactions while waiting however when went home noted increased nasal congestion, coughing and throat irritation. She took albuterol 1 puff and felt better but then developed abdominal cramps and her breathing feels off.   She is calling to see if she should inject epinephrine or what to do. Advised patient to take albuterol 4 puffs but states that will make her very agitated so she will take 2 puffs. Also recommended that she takes benadryl but she was at work and didn't want to take it when she goes home because she has to watch her child. She did take allegra prior to her injection.   15 minutes later she called and asked if she can take a prednisone that she has left over. Okay if she is still having issues.

## 2019-07-22 ENCOUNTER — Encounter: Payer: Self-pay | Admitting: Allergy & Immunology

## 2019-07-22 MED ORDER — LEVALBUTEROL TARTRATE 45 MCG/ACT IN AERO: 2.0000 | INHALATION_SPRAY | Freq: Four times a day (QID) | RESPIRATORY_TRACT | 12 refills | Status: DC | PRN

## 2019-07-22 NOTE — Patient Instructions (Addendum)
1. Perennial and seasonal allergic rhinitis (dust mites, cat, dog, roach, trees, ragweed) - We are going to change you to the Silver Vial and change you to Schedule A. - For the time being, we are going to maximize your premedication regimen (this is not a long term regimen): prednisone 10mg , Allegra 360mg  (two tablets), Singulair 10mg  - Continue Allegra 180mg  once a day for a runny nose or itch - Continue Flonase 2 sprays in each nostril once a day for a stuffy nose - Continue saline nasal rinses, to be used before fluticasone.   2. ? Asthma - The definitive diagnosis of asthma involves a methacholine challenge. - We will hold off on doing that due to patient preference. - Daily controller medication(s): Symbicort 160/4.70mcg two puffs twice daily with spacer - Prior to physical activity: albuterol 2 puffs 10-15 minutes before physical activity. - Rescue medications: albuterol 4 puffs every 4-6 hours as needed - Asthma control goals:  * Full participation in all desired activities (may need albuterol before activity) * Albuterol use two time or less a week on average (not counting use with activity) * Cough interfering with sleep two time or less a month * Oral steroids no more than once a year * No hospitalizations  3. Return in about 2 months (around 09/20/2019). This can be an in-person, a virtual Webex or a telephone follow up visit.   Please inform us of any Emergency Department visits, hospitalizations, or changes in symptoms. Call us before going to the ED for breathing or allergy symptoms since we might be able to fit you in for a sick visit. Feel free to contact us anytime with any questions, problems, or concerns.  It was a pleasure to talk to you today today!  Websites that have reliable patient information: 1. American Academy of Asthma, Allergy, and Immunology: www.aaaai.org 2. Food Allergy Research and Education (FARE): foodallergy.org 3. Mothers of Asthmatics:  http://www.asthmacommunitynetwork.org 4. American College of Allergy, Asthma, and Immunology: www.acaai.org  "Like" [place] on Facebook and Instagram for our latest updates!      Make sure you are registered to vote! If you have moved or changed any of your contact information, you will need to get this updated before voting!  In some cases, you MAY be able to register to vote online: CrabDealer.it    Voter ID laws are NOT going into effect for the General Election in November 2020! DO NOT let this stop you from exercising your right to vote!   Absentee voting is the SAFEST way to vote during the coronavirus pandemic!   Download and print an absentee ballot request form at [email or web address]-[email or web address]-Request or you can scan the QR code below with your smart phone:      More information on absentee ballots can be found here: [email or web address]://[email or web address]-[email or web address]

## 2019-07-24 ENCOUNTER — Telehealth: Payer: Self-pay | Admitting: *Deleted

## 2019-07-24 NOTE — Telephone Encounter (Signed)
-----   Message from Valentina Shaggy, MD sent at 07/22/2019  7:29 AM EDT ----- Can someone note the following on her immunotherapy flow chart: Silver Vial and change to Schedule A

## 2019-07-24 NOTE — Telephone Encounter (Signed)
Noted in immunotherapy tab. Nira Conn can you mix vials down to Silver

## 2019-07-25 NOTE — Telephone Encounter (Signed)
Mixed down.

## 2019-07-27 ENCOUNTER — Ambulatory Visit (INDEPENDENT_AMBULATORY_CARE_PROVIDER_SITE_OTHER): Payer: BC Managed Care – PPO | Admitting: *Deleted

## 2019-07-27 DIAGNOSIS — J309 Allergic rhinitis, unspecified: Secondary | ICD-10-CM

## 2019-08-01 ENCOUNTER — Other Ambulatory Visit: Payer: Self-pay | Admitting: Allergy & Immunology

## 2019-08-03 ENCOUNTER — Ambulatory Visit (INDEPENDENT_AMBULATORY_CARE_PROVIDER_SITE_OTHER): Payer: BC Managed Care – PPO | Admitting: *Deleted

## 2019-08-03 DIAGNOSIS — J309 Allergic rhinitis, unspecified: Secondary | ICD-10-CM

## 2019-08-04 ENCOUNTER — Other Ambulatory Visit: Payer: Self-pay

## 2019-08-04 MED ORDER — OMEPRAZOLE 40 MG PO CPDR: 40.0000 mg | DELAYED_RELEASE_CAPSULE | Freq: Every day | ORAL | 1 refills | Status: DC

## 2019-08-04 NOTE — Telephone Encounter (Signed)
Fax request from pharmacy for omeprazole 40 mg capsule. I am sending refill in to requesting pharmacy.

## 2019-08-05 ENCOUNTER — Other Ambulatory Visit: Payer: Self-pay | Admitting: Allergy & Immunology

## 2019-08-07 ENCOUNTER — Other Ambulatory Visit: Payer: Self-pay | Admitting: *Deleted

## 2019-08-07 ENCOUNTER — Encounter: Payer: Self-pay | Admitting: Allergy & Immunology

## 2019-08-07 ENCOUNTER — Telehealth: Payer: Self-pay | Admitting: Allergy & Immunology

## 2019-08-07 MED ORDER — PREDNISONE 10 MG PO TABS: ORAL_TABLET | ORAL | 0 refills | Status: DC

## 2019-08-07 NOTE — Telephone Encounter (Signed)
Patient responded via MyChart message and has been routed to Dr. Ernst Bowler regarding prednisone.

## 2019-08-07 NOTE — Telephone Encounter (Signed)
Received fax for refill for prednisone. Called patient to see how she was doing per Dr. Ernst Bowler, had to leave a message. Dr. Ernst Bowler is wanting to know how patient is doing and if she needed the prednisone. Per Dr. Ernst Bowler will need to route message him once we hear back.

## 2019-08-08 DIAGNOSIS — D225 Melanocytic nevi of trunk: Secondary | ICD-10-CM | POA: Diagnosis not present

## 2019-08-08 DIAGNOSIS — L720 Epidermal cyst: Secondary | ICD-10-CM | POA: Diagnosis not present

## 2019-08-08 DIAGNOSIS — D485 Neoplasm of uncertain behavior of skin: Secondary | ICD-10-CM | POA: Diagnosis not present

## 2019-08-08 DIAGNOSIS — D2262 Melanocytic nevi of left upper limb, including shoulder: Secondary | ICD-10-CM | POA: Diagnosis not present

## 2019-08-08 DIAGNOSIS — Z1283 Encounter for screening for malignant neoplasm of skin: Secondary | ICD-10-CM | POA: Diagnosis not present

## 2019-08-09 ENCOUNTER — Ambulatory Visit (INDEPENDENT_AMBULATORY_CARE_PROVIDER_SITE_OTHER): Payer: BC Managed Care – PPO

## 2019-08-09 DIAGNOSIS — J309 Allergic rhinitis, unspecified: Secondary | ICD-10-CM | POA: Diagnosis not present

## 2019-08-09 LAB — CHRONIC URTICARIA: cu index: 4.6 (ref <10)

## 2019-08-09 LAB — C1 ESTERASE INHIBITOR, FUNCTIONAL: C1INH Functional/C1INH Total MFr SerPl: 94 %mean normal

## 2019-08-09 LAB — C3 AND C4
Complement C3, Serum: 125 mg/dL (ref 82–167)
Complement C4, Serum: 24 mg/dL (ref 14–44)

## 2019-08-09 LAB — C1 ESTERASE INHIBITOR: C1INH SerPl-mCnc: 30 mg/dL (ref 21–39)

## 2019-08-09 LAB — ALPHA-GAL PANEL
Alpha Gal IgE*: <0.1 kU/L (ref <0.10)
Beef (Bos spp) IgE: 2.83 kU/L — ABNORMAL HIGH (ref <0.35)
Class Interpretation: 2
Class Interpretation: 3
Class Interpretation: 4
Lamb/Mutton (Ovis spp) IgE: 7.15 kU/L — ABNORMAL HIGH (ref <0.35)
Pork (Sus spp) IgE: 18.9 kU/L — ABNORMAL HIGH (ref <0.35)

## 2019-08-09 LAB — COMPLEMENT COMPONENT C1Q: Complement C1Q: 14.1 mg/dL (ref 10.3–20.5)

## 2019-08-09 LAB — RHEUMATOID FACTOR: Rheumatoid fact SerPl-aCnc: <10 IU/mL (ref 0.0–13.9)

## 2019-08-09 LAB — C-REACTIVE PROTEIN: CRP: 2 mg/L (ref 0–10)

## 2019-08-09 LAB — TRYPTASE: Tryptase: 6.4 ug/L (ref 2.2–13.2)

## 2019-08-09 LAB — SEDIMENTATION RATE: Sed Rate: 4 mm/hr (ref 0–32)

## 2019-08-09 LAB — ANA W/REFLEX IF POSITIVE: Anti Nuclear Antibody (ANA): NEGATIVE

## 2019-08-10 NOTE — Addendum Note (Signed)
Addended by: Valentina Shaggy on: 08/10/2019 05:52 PM   Modules accepted: Orders

## 2019-08-11 ENCOUNTER — Other Ambulatory Visit: Payer: BC Managed Care – PPO

## 2019-08-11 ENCOUNTER — Ambulatory Visit: Payer: BC Managed Care – PPO | Admitting: Obstetrics & Gynecology

## 2019-08-16 ENCOUNTER — Telehealth: Payer: Self-pay

## 2019-08-16 NOTE — Telephone Encounter (Signed)
FYI: Patient called to let Dr Ernst Bowler know she is feeling amazing now without meat and cheese.  Thanks

## 2019-08-16 NOTE — Telephone Encounter (Signed)
I am glad we finally somewhat fixed her!  Salvatore Marvel, MD [facility] [place] [facility] [place]

## 2019-08-22 ENCOUNTER — Encounter: Payer: Self-pay | Admitting: Allergy & Immunology

## 2019-08-23 ENCOUNTER — Ambulatory Visit (INDEPENDENT_AMBULATORY_CARE_PROVIDER_SITE_OTHER): Payer: BC Managed Care – PPO

## 2019-08-23 DIAGNOSIS — J309 Allergic rhinitis, unspecified: Secondary | ICD-10-CM | POA: Diagnosis not present

## 2019-08-30 ENCOUNTER — Ambulatory Visit (INDEPENDENT_AMBULATORY_CARE_PROVIDER_SITE_OTHER): Payer: BC Managed Care – PPO

## 2019-08-30 DIAGNOSIS — J309 Allergic rhinitis, unspecified: Secondary | ICD-10-CM | POA: Diagnosis not present

## 2019-09-05 ENCOUNTER — Other Ambulatory Visit: Payer: Self-pay | Admitting: Obstetrics & Gynecology

## 2019-09-05 DIAGNOSIS — R1031 Right lower quadrant pain: Secondary | ICD-10-CM

## 2019-09-06 ENCOUNTER — Ambulatory Visit (INDEPENDENT_AMBULATORY_CARE_PROVIDER_SITE_OTHER): Payer: BC Managed Care – PPO | Admitting: *Deleted

## 2019-09-06 ENCOUNTER — Ambulatory Visit (INDEPENDENT_AMBULATORY_CARE_PROVIDER_SITE_OTHER): Payer: BC Managed Care – PPO

## 2019-09-06 ENCOUNTER — Encounter: Payer: Self-pay | Admitting: Women's Health

## 2019-09-06 ENCOUNTER — Other Ambulatory Visit: Payer: Self-pay

## 2019-09-06 ENCOUNTER — Ambulatory Visit (INDEPENDENT_AMBULATORY_CARE_PROVIDER_SITE_OTHER): Payer: BC Managed Care – PPO | Admitting: Women's Health

## 2019-09-06 VITALS — BP 118/80 | HR 96 | Ht 68.0 in | Wt 170.0 lb

## 2019-09-06 DIAGNOSIS — J309 Allergic rhinitis, unspecified: Secondary | ICD-10-CM

## 2019-09-06 DIAGNOSIS — Z91018 Allergy to other foods: Secondary | ICD-10-CM | POA: Insufficient documentation

## 2019-09-06 DIAGNOSIS — N926 Irregular menstruation, unspecified: Secondary | ICD-10-CM | POA: Diagnosis not present

## 2019-09-06 DIAGNOSIS — R1031 Right lower quadrant pain: Secondary | ICD-10-CM | POA: Diagnosis not present

## 2019-09-06 NOTE — Progress Notes (Signed)
   GYN VISIT Patient name: Lindsey Jensen MRN [id number]  Date of birth: [birth year] Chief Complaint:   Follow-up (u/s)  History of Present Illness:   Lindsey Jensen is a 33 y.o. G30P1001 Caucasian female being seen today for f/u after gyn u/s that was done for irregular periods and pelvic pain. Had baby 06/16/18, period returned by Aug, was regular until May, then June, July, Aug has only had a couple of days of spotting. Has also had intermittent pelvic pain, dyspareunia. Had prolonged episode of coughing and headaches after having baby as well. Went to allergist recently, has meat allergy! Has cut out meat/dairy for past 3-4wks and feels completely better!  Patient's last menstrual period was 05/20/2019. The current method of family planning is vasectomy.  Last pap 08/31/16. Results were:  normal Review of Systems:   Pertinent items are noted in HPI Denies fever/chills, dizziness, headaches, visual disturbances, fatigue, shortness of breath, chest pain, abdominal pain, vomiting, abnormal vaginal discharge/itching/odor/irritation, problems with periods, bowel movements, urination, or intercourse unless otherwise stated above.  Pertinent History Reviewed:  Reviewed past medical,surgical, social, obstetrical and family history.  Reviewed problem list, medications and allergies. Physical Assessment:   Vitals:   09/06/19 1133  BP: 118/80  Pulse: 96  Weight: 170 lb (77.1 kg)  Height: 5\' 8"  (1.727 m)  Body mass index is 25.85 kg/m.       Physical Examination:   General appearance: alert, well appearing, and in no distress  Mental status: alert, oriented to person, place, and time  Skin: warm & dry   Cardiovascular: normal heart rate noted  Respiratory: normal respiratory effort, no distress  Abdomen: soft, non-tender   Pelvic: examination not indicated  Extremities: no edema   Today's pelvic u/s:  Lindsey Jensen is a 33 y.o. G1P1001 LMP 05/20/2019,she is here for a pelvic sonogram for irregular  periods with RLQ pain.  Uterus                      7.5 x 4.1 x 6.8 cm, Total uterine volume 109 cc, homogeneous anteverted uterus,wnl, Endometrium          8.6 mm, symmetrical, wnl Right ovary             2.6 x 2.6 x 3.4 cm, wnl Left ovary                2.9 x 1.8 x 1.7 cm, wnl  Technician Comments: PELVIC US TA/TV: homogeneous anteverted uterus,wnl,EEC 8.6 mm,normal ovaries bilat,ovaries appear mobile,left adnexal pain during ultrasound  [employer] 09/06/2019 11:35 AM  No results found for this or any previous visit (from the past 24 hour(s)).  Assessment & Plan:  1) Irregular periods> discussed options of meds vs waiting to see if they will straighten out on new diet, prefers to wait  2) Pelvic pain> resolved  Meds: No orders of the defined types were placed in this encounter.   No orders of the defined types were placed in this encounter.   Return for 1st available, Pap & physical.  Roma Schanz CNM, [facility] 09/06/2019 2:50 PM

## 2019-09-06 NOTE — Progress Notes (Signed)
PELVIC US TA/TV: homogeneous anteverted uterus,wnl,EEC 8.6 mm,normal ovaries bilat,ovaries appear mobile,left adnexal pain during ultrasound

## 2019-09-13 ENCOUNTER — Ambulatory Visit (INDEPENDENT_AMBULATORY_CARE_PROVIDER_SITE_OTHER): Payer: BC Managed Care – PPO

## 2019-09-13 DIAGNOSIS — J309 Allergic rhinitis, unspecified: Secondary | ICD-10-CM

## 2019-09-15 ENCOUNTER — Telehealth: Payer: Self-pay | Admitting: Allergy & Immunology

## 2019-09-15 NOTE — Telephone Encounter (Signed)
I would recommend that she take an extra antihistamine to see if that helps.  That could have a long time for reaction to occur.  I do want to make sure that she is breathing okay through her throat, however.  Salvatore Marvel, MD [facility] [place] [facility] [place]

## 2019-09-15 NOTE — Telephone Encounter (Signed)
I spoke with Lindsey Jensen. She told me that she is having an increased amount of post nasal drainage, residual soreness in her throat and a little pressure in her chest(which she used her albuterol inhaler for). She is doing better today than before. I told her of the recommendations that Dr. Ernst Bowler had as well as to call us if she had any issues over the weekend. Patient was agreeable with plan and will reach out if anything further is needed. She did mention repeating the injection dosing again with the next one.

## 2019-09-15 NOTE — Telephone Encounter (Signed)
Patient called and stated that she received her allergy injection on Wednesday and that night her throat started irritating her. She said that her throat is sore but not like sick sore. She stated that it is sore in the area that is under the jaw and behind the ear. Patient is wondering if it might be due to her allergy injections or something else. Please advise.

## 2019-09-15 NOTE — Telephone Encounter (Signed)
I am fine with repeating the dose. I am glad that she is doing better.  Salvatore Marvel, MD [facility] [place] [facility] [place]

## 2019-09-27 ENCOUNTER — Ambulatory Visit (INDEPENDENT_AMBULATORY_CARE_PROVIDER_SITE_OTHER): Payer: BC Managed Care – PPO

## 2019-09-27 ENCOUNTER — Ambulatory Visit: Payer: BC Managed Care – PPO | Admitting: Pulmonary Disease

## 2019-09-27 ENCOUNTER — Other Ambulatory Visit: Payer: Self-pay

## 2019-09-27 ENCOUNTER — Encounter: Payer: Self-pay | Admitting: Pulmonary Disease

## 2019-09-27 VITALS — BP 140/72 | HR 107 | Ht 68.0 in | Wt 171.2 lb

## 2019-09-27 DIAGNOSIS — J309 Allergic rhinitis, unspecified: Secondary | ICD-10-CM

## 2019-09-27 DIAGNOSIS — R059 Cough, unspecified: Secondary | ICD-10-CM

## 2019-09-27 DIAGNOSIS — R05 Cough: Secondary | ICD-10-CM | POA: Diagnosis not present

## 2019-09-27 MED ORDER — BECLOMETHASONE DIPROPIONATE 80 MCG/ACT IN AERS: 2.0000 | INHALATION_SPRAY | Freq: Two times a day (BID) | RESPIRATORY_TRACT | 5 refills | Status: DC

## 2019-09-27 MED ORDER — BECLOMETHASONE DIPROPIONATE 80 MCG/ACT IN AERS: 2.0000 | INHALATION_SPRAY | Freq: Two times a day (BID) | RESPIRATORY_TRACT | 0 refills | Status: DC

## 2019-09-27 NOTE — Addendum Note (Signed)
Addended by: Elton Sin on: 09/27/2019 03:32 PM   Modules accepted: Orders

## 2019-09-27 NOTE — Progress Notes (Signed)
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 vegan Has been receiving allergy shots She does have some symptoms on the day of allergy shots  Found to be allergic to dogs, cats, dust, roaches  Not really bringing up any significant secretions No chest pains or chest discomfort  Has been on Qvar-help in symptoms  Smokes about half a pack a day at most  Denies a history of asthma She was not having any symptoms of shortness of breath or chronic cough or symptoms of a bronchitis prior to the last few months  She states most of her symptoms started post childbirth She notices worse symptoms just prior to her period  Outpatient Encounter Medications as of 09/27/2019  Medication Sig  . azelastine (ASTELIN) 0.1 % nasal spray INHALE 2 SPRAYS EACH NOSTRIL 1-2 TIMES DAILY AS NEEDED.  Marland Kitchen beclomethasone (QVAR REDIHALER) 80 MCG/ACT inhaler Inhale 2 puffs into the lungs 2 (two) times daily.  Marland Kitchen EPINEPHrine (EPIPEN 2-PAK) 0.3 mg/0.3 mL IJ SOAJ injection Use as directed for severe allergic reaction  . fluticasone (FLONASE) 50 MCG/ACT nasal spray SPRAY 2 SPRAYS INTO EACH NOSTRIL EVERY DAY  . levalbuterol (XOPENEX HFA) 45 MCG/ACT inhaler Inhale 2 puffs into the lungs every 6 (six) hours as needed for wheezing or shortness of breath.  . predniSONE (DELTASONE) 10 MG tablet Take two tablets (20mg ) twice daily for three days, then one tablet (10mg ) twice daily for three days, then STOP. (Patient taking differently: Taking on Weds. - day of allergy shots)  . [DISCONTINUED] omeprazole (PRILOSEC) 40 MG capsule Take 1 capsule (40 mg total) by mouth daily.  .  beclomethasone (QVAR) 80 MCG/ACT inhaler Inhale 2 puffs into the lungs 2 (two) times daily.  . [DISCONTINUED] albuterol (PROVENTIL HFA) 108 (90 Base) MCG/ACT inhaler Inhale 2 puffs into the lungs every 6 (six) hours as needed for wheezing or shortness of breath. (Patient not taking: Reported on 09/27/2019)   No facility-administered encounter medications on file as of 09/27/2019.     Allergies as of 09/27/2019 - Review Complete 09/27/2019  Allergen Reaction Noted  . 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 Contraceptive education 01/26/2014  . Medical history non-contributory   . Moderate persistent asthma, uncomplicated 04/15/8340    Past Surgical History:  Procedure Laterality Date  . NO PAST SURGERIES      Family History  Problem Relation Age of Onset  . Heart disease Father        heart attack  . Diabetes Mother   . Depression Mother   . Anxiety disorder Mother   . Heart disease Mother   . Heart disease Maternal Grandmother        CHF  . Alzheimer's disease Maternal Grandfather     Social History   Socioeconomic History  . Marital status: Married    Spouse name: Not on file  . Number of children: 1  . Years of education: Not on file  . Highest education level: Not on file  Occupational History  . Not on file  Social Needs  . Financial resource strain: Not on file  .  Food insecurity    Worry: Not on file    Inability: Not on file  . Transportation needs    Medical: Not on file    Non-medical: Not on file  Tobacco Use  . Smoking status: Current Every Day Smoker    Packs/day: 0.50    Types: Cigarettes  . Smokeless tobacco: Never Used  Substance and Sexual Activity  . Alcohol use: No    Alcohol/week: 14.0 standard drinks    Types: 14 Glasses of wine per week    Frequency: Never    Comment: glass of wine every night or every other night   . Drug use: No  . Sexual activity: Yes    Birth control/protection: None   Lifestyle  . Physical activity    Days per week: Not on file    Minutes per session: Not on file  . Stress: Not on file  Relationships  . Social Musicianconnections    Talks on phone: Not on file    Gets together: Not on file    Attends religious service: Not on file    Active member of club or organization: Not on file    Attends meetings of clubs or organizations: Not on file    Relationship status: Not on file  . Intimate partner violence    Fear of current or ex partner: Not on file    Emotionally abused: Not on file    Physically abused: Not on file    Forced sexual activity: Not on file  Other Topics Concern  . Not on file  Social History Narrative  . Not on file    Review of Systems  Constitutional: Negative for fatigue.  HENT: Negative.        Throat irritation  Eyes: Negative.   Respiratory: Negative for cough, shortness of breath and wheezing.   Cardiovascular: Negative.   Gastrointestinal: Negative.   Endocrine: Negative.   Genitourinary: Negative.   All other systems reviewed and are negative.   Vitals:   09/27/19 1506  BP: 140/72  Pulse: (!) 107  SpO2: 98%  Saturation was spurious  Physical Exam  Constitutional: She appears well-developed and well-nourished.  HENT:  Head: Normocephalic and atraumatic.  Eyes: Pupils are equal, round, and reactive to light. Right eye exhibits no discharge. Left eye exhibits no discharge.  Neck: Normal range of motion. Neck supple. No tracheal deviation present. No thyromegaly present.  Cardiovascular: Normal rate and regular rhythm.  Pulmonary/Chest: Effort normal and breath sounds normal. No respiratory distress. She has no wheezes. She has no rales. She exhibits no tenderness.  Abdominal: Soft. Bowel sounds are normal. She exhibits no distension. There is no abdominal tenderness.     Data Reviewed: FeNo of 47, decreased to 13, 17 on today's measurement Recent chest x-ray performed 06/13/2019 shows no acute  infiltrate-reviewed by myself  Normal spirometry performed in the office recently-in 2019  Assessment:   Multiple allergies for which she is receiving allergy shots -Symptoms seem to be stabilizing  Found to be allergic to meet -She is vegan now  Persistent acute bronchitis with accompanying cough -Cough is almost completely resolved\  Nasal stuffiness and congestion  -Continue Singulair  Cough -resolved  Multiple allergens   Plan/Recommendations:  We will continue Qvar 82 puffs twice daily at present -Did discuss trying to get off Qvar at some point  I will see her back in the office in 6 months  Continue smoking cessation efforts   Virl DiamondAdewale Darilyn Storbeck MD [facility] 09/27/2019, 3:24 PM  CC: Elfredia Nevins, MD

## 2019-09-27 NOTE — Patient Instructions (Signed)
Multiple allergies-currently on allergy shots Cough-significant improvement  Continue Qvar-at some point, we should consider stopping Qvar and see what your symptoms are like  Continue with the allergy shots  Continue smoking cessation efforts  I will see you back in about 6 months  Call with significant concerns

## 2019-10-04 DIAGNOSIS — Z0001 Encounter for general adult medical examination with abnormal findings: Secondary | ICD-10-CM | POA: Diagnosis not present

## 2019-10-04 DIAGNOSIS — F17290 Nicotine dependence, other tobacco product, uncomplicated: Secondary | ICD-10-CM | POA: Diagnosis not present

## 2019-10-04 DIAGNOSIS — E663 Overweight: Secondary | ICD-10-CM | POA: Diagnosis not present

## 2019-10-04 DIAGNOSIS — K219 Gastro-esophageal reflux disease without esophagitis: Secondary | ICD-10-CM | POA: Diagnosis not present

## 2019-10-04 DIAGNOSIS — Z6825 Body mass index (BMI) 25.0-25.9, adult: Secondary | ICD-10-CM | POA: Diagnosis not present

## 2019-10-06 ENCOUNTER — Ambulatory Visit (INDEPENDENT_AMBULATORY_CARE_PROVIDER_SITE_OTHER): Payer: BC Managed Care – PPO

## 2019-10-06 DIAGNOSIS — J309 Allergic rhinitis, unspecified: Secondary | ICD-10-CM | POA: Diagnosis not present

## 2019-10-11 ENCOUNTER — Encounter: Payer: Self-pay | Admitting: Allergy & Immunology

## 2019-10-11 ENCOUNTER — Ambulatory Visit: Payer: Self-pay

## 2019-10-11 ENCOUNTER — Other Ambulatory Visit: Payer: Self-pay

## 2019-10-11 ENCOUNTER — Ambulatory Visit (INDEPENDENT_AMBULATORY_CARE_PROVIDER_SITE_OTHER): Payer: BC Managed Care – PPO | Admitting: Allergy & Immunology

## 2019-10-11 VITALS — BP 118/78 | HR 97 | Temp 98.4°F | Resp 18

## 2019-10-11 DIAGNOSIS — J4540 Moderate persistent asthma, uncomplicated: Secondary | ICD-10-CM

## 2019-10-11 DIAGNOSIS — T782XXD Anaphylactic shock, unspecified, subsequent encounter: Secondary | ICD-10-CM | POA: Diagnosis not present

## 2019-10-11 DIAGNOSIS — Z720 Tobacco use: Secondary | ICD-10-CM | POA: Diagnosis not present

## 2019-10-11 DIAGNOSIS — J302 Other seasonal allergic rhinitis: Secondary | ICD-10-CM

## 2019-10-11 DIAGNOSIS — J309 Allergic rhinitis, unspecified: Secondary | ICD-10-CM

## 2019-10-11 DIAGNOSIS — J3089 Other allergic rhinitis: Secondary | ICD-10-CM | POA: Diagnosis not present

## 2019-10-11 NOTE — Progress Notes (Signed)
FOLLOW UP  Date of Service/Encounter:  10/11/19   Assessment:   Seasonal and perennial allergic rhinitis(dust mites, cat, dog, roach, trees, and ragweed)  Intermittent episodes of throat irritation for days following her allergy injections (despite antihistamines and prednisone)  Moderate persistent asthma, uncomplicated    Plan/Recommendations:   1. Perennial and seasonal allergic rhinitis (dust mites, cat, dog, roach, trees, ragweed) - Continue with allergy shots at the same schedule (Schedule A).  - We held the prednisone today so let us know how that goes.  - Continue Allegra 180mg  once a day for a runny nose or itch - Continue Flonase 2 sprays in each nostril once a day for a stuffy nose - Continue saline nasal rinses, to be used before fluticasone.   2. ? Asthma - triggered by foods - Get the blood work to look for allergies.  - We will contact you in 1-2 weeks with the results of the testing.  - Daily controller medication(s): NONE - Prior to physical activity: albuterol 2 puffs 10-15 minutes before physical activity. - Rescue medications: albuterol 4 puffs every 4-6 hours as needed - Asthma control goals:  * Full participation in all desired activities (may need albuterol before activity) * Albuterol use two time or less a week on average (not counting use with activity) * Cough interfering with sleep two time or less a month * Oral steroids no more than once a year * No hospitalizations  3. Return in about 6 months (around 04/10/2020). This can be an in-person, a virtual Webex or a telephone follow up visit.  Subjective:   Lindsey Jensen is a 33 y.o. female presenting today for follow up of  Chief Complaint  Patient presents with  . Allergic Rhinitis   . Asthma    KAJAL SCALICI has a history of the following: Patient Active Problem List   Diagnosis Date Noted  . 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 Cough 06/17/2019  . Oral candida 06/17/2019  . Seasonal and  perennial allergic rhinitis 05/19/2019  . Moderate persistent asthma, uncomplicated 05/19/2019  . Tobacco use 10/07/2017  . Generalized anxiety disorder 08/23/2013    History obtained from: chart review and patient.  Lindsey Jensen is a 33 y.o. female presenting for a follow up visit.  We last talked via televisit in July 2020.  At that time, she had recently experienced an anaphylaxis episode from her first allergy shot.  We changed her to the silver vial and changed her schedule to A instead of B.  For the time being, we maximized her premedication regimen with prednisone 10 mg, Allegra 2 tablets, and Singulair 10 mg prior to shots.  For her asthma, she was still not on board with the diagnosis.  We did offer a methacholine challenge for confirmation but she declined this.  We changed her from albuterol to Xopenex due to tachycardia.  We also continued Symbicort 160/4.5 mcg 2 puffs twice daily with a spacer.  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  Interestingly, her IgE to alpha gal was negative.  Since the last visit, she has done well. She had contacted me in the interim to let me know that avoidance of the mammalian meats has resulted in improvement in her symptoms.  She is no longer eating any red meat at all. They do not eat pork. She changed her  diet to vegan. She cut out dairy and eggs and all animal products. When she started to feel really good, she took a piece of cheese and noticed that she needed some inhaler after eating it. She skipped a day and tried an egg and cheese omlet the next morning. She stopped putting creamer in her coffee and has been using nut milks instead. Overall the avoidance of red meat has resulted in an improvement in her frequency of reactions.   She remains on allergen immunotherapy. Out of an abundance of caution, we did start prednisone   as part of her pre-medication regimen for her allergy shots. She is interested in stopping this today to see how she does. She reports a lot of weight gain with this.   She does have omeprazole but she stopped taking it. It was working well, but she is just not one to take something on a routine basis. She stopped her daily asthma medication as well. She has found that avoiding certain foods actually makes her asthma better. She lists a variety of foods that trigger her asthma symptoms. Again, this does stem from her desire to avoid medications in general. She has not needed any prednisone for asthma exacerbations, ED visits, or UC visits for her asthma symptoms.   Otherwise, there have been no changes to her past medical history, surgical history, family history, or social history.    Review of Systems  Constitutional: Negative.  Negative for chills, fever, malaise/fatigue and weight loss.  HENT: Negative.  Negative for congestion, ear discharge and ear pain.   Eyes: Negative for pain, discharge and redness.  Respiratory: Negative for cough, sputum production, shortness of breath and wheezing.   Cardiovascular: Negative.  Negative for chest pain and palpitations.  Gastrointestinal: Negative for abdominal pain, heartburn, nausea and vomiting.  Skin: Negative.  Negative for itching and rash.  Neurological: Negative for dizziness and headaches.  Endo/Heme/Allergies: Negative for environmental allergies. Does not bruise/bleed easily.       Objective:   Blood pressure 118/78, pulse 97, temperature 98.4 F (36.9 C), temperature source Temporal, resp. rate 18, SpO2 99 %, not currently breastfeeding. There is no height or weight on file to calculate BMI.   Physical Exam:  Physical Exam  Constitutional: She appears well-developed.  Pleasant female. Very talkative. Somewhat anxious.   HENT:  Head: Normocephalic and atraumatic.  Right Ear: Tympanic membrane, external ear and ear canal  normal.  Left Ear: Tympanic membrane, external ear and ear canal normal.  Nose: Mucosal edema and rhinorrhea present. No nasal deformity or septal deviation. No epistaxis. Right sinus exhibits no maxillary sinus tenderness and no frontal sinus tenderness. Left sinus exhibits no maxillary sinus tenderness and no frontal sinus tenderness.  Mouth/Throat: Uvula is midline and oropharynx is clear and moist. Mucous membranes are not pale and not dry.  Eyes: Pupils are equal, round, and reactive to light. Conjunctivae and EOM are normal. Right eye exhibits no chemosis and no discharge. Left eye exhibits no chemosis and no discharge. Right conjunctiva is not injected. Left conjunctiva is not injected.  Cardiovascular: Normal rate, regular rhythm and normal heart sounds.  Respiratory: Effort normal and breath sounds normal. No accessory muscle usage. No tachypnea. No respiratory distress. She has no wheezes. She has no rhonchi. She has no rales. She exhibits no tenderness.  Moving air well in all lung fields. No increased work of breathing.  Lymphadenopathy:    She has cervical adenopathy.       Right  cervical: Superficial cervical adenopathy present.       Left cervical: Superficial cervical adenopathy present.  Neurological: She is alert.  Skin: No abrasion, no petechiae and no rash noted. Rash is not papular, not vesicular and not urticarial. No erythema. No pallor.  Psychiatric: She has a normal mood and affect.     Diagnostic studies: none     Salvatore Marvel, MD  [facility] [place]

## 2019-10-11 NOTE — Patient Instructions (Addendum)
1. Perennial and seasonal allergic rhinitis (dust mites, cat, dog, roach, trees, ragweed) - Continue with allergy shots at the same schedule (Schedule A).  - We held the prednisone today so let us know how that goes.  - Continue Allegra 180mg  once a day for a runny nose or itch - Continue Flonase 2 sprays in each nostril once a day for a stuffy nose - Continue saline nasal rinses, to be used before fluticasone.   2. ? Asthma - triggered by foods - Daily controller medication(s): NONE - Prior to physical activity: albuterol 2 puffs 10-15 minutes before physical activity. - Rescue medications: albuterol 4 puffs every 4-6 hours as needed - Asthma control goals:  * Full participation in all desired activities (may need albuterol before activity) * Albuterol use two time or less a week on average (not counting use with activity) * Cough interfering with sleep two time or less a month * Oral steroids no more than once a year * No hospitalizations  3. Return in about 6 months (around 04/10/2020). This can be an in-person, a virtual Webex or a telephone follow up visit.   Please inform us of any Emergency Department visits, hospitalizations, or changes in symptoms. Call us before going to the ED for breathing or allergy symptoms since we might be able to fit you in for a sick visit. Feel free to contact us anytime with any questions, problems, or concerns.  It was a pleasure to talk to you today today!  Websites that have reliable patient information: 1. American Academy of Asthma, Allergy, and Immunology: www.aaaai.org 2. Food Allergy Research and Education (FARE): foodallergy.org 3. Mothers of Asthmatics: http://www.asthmacommunitynetwork.org 4. American College of Allergy, Asthma, and Immunology: www.acaai.org  "Like" [place] on Facebook and Instagram for our latest updates!      Make sure you are registered to vote! If you have moved or changed any of your contact information, you will need  to get this updated before voting!  In some cases, you MAY be able to register to vote online: [email or web address]    Voter ID laws are NOT going into effect for the General Election in November 2020! DO NOT let this stop you from exercising your right to vote!   Absentee voting is the SAFEST way to vote during the coronavirus pandemic!   Download and print an absentee ballot request form at [email or web address]-Request or you can scan the QR code below with your smart phone:      More information on absentee ballots can be found here: [email or web address]

## 2019-10-12 DIAGNOSIS — E663 Overweight: Secondary | ICD-10-CM | POA: Diagnosis not present

## 2019-10-13 ENCOUNTER — Encounter: Payer: Self-pay | Admitting: Allergy & Immunology

## 2019-10-18 ENCOUNTER — Ambulatory Visit (INDEPENDENT_AMBULATORY_CARE_PROVIDER_SITE_OTHER): Payer: BC Managed Care – PPO | Admitting: *Deleted

## 2019-10-18 DIAGNOSIS — J309 Allergic rhinitis, unspecified: Secondary | ICD-10-CM | POA: Diagnosis not present

## 2019-10-25 ENCOUNTER — Ambulatory Visit (INDEPENDENT_AMBULATORY_CARE_PROVIDER_SITE_OTHER): Payer: BC Managed Care – PPO

## 2019-10-25 DIAGNOSIS — J309 Allergic rhinitis, unspecified: Secondary | ICD-10-CM | POA: Diagnosis not present

## 2019-11-01 ENCOUNTER — Ambulatory Visit (INDEPENDENT_AMBULATORY_CARE_PROVIDER_SITE_OTHER): Payer: BC Managed Care – PPO

## 2019-11-01 DIAGNOSIS — J309 Allergic rhinitis, unspecified: Secondary | ICD-10-CM | POA: Diagnosis not present

## 2019-11-08 ENCOUNTER — Telehealth: Payer: Self-pay

## 2019-11-08 ENCOUNTER — Ambulatory Visit (INDEPENDENT_AMBULATORY_CARE_PROVIDER_SITE_OTHER): Payer: BC Managed Care – PPO

## 2019-11-08 DIAGNOSIS — T782XXD Anaphylactic shock, unspecified, subsequent encounter: Secondary | ICD-10-CM

## 2019-11-08 DIAGNOSIS — J309 Allergic rhinitis, unspecified: Secondary | ICD-10-CM

## 2019-11-08 NOTE — Telephone Encounter (Signed)
Patient would like to add [place] and chicken to her labs.  Please Advise

## 2019-11-08 NOTE — Telephone Encounter (Signed)
Fine with me. Added labs.   Salvatore Marvel, MD [facility] [place] [facility] [place]

## 2019-11-08 NOTE — Telephone Encounter (Signed)
Sent patient a mychart message.

## 2019-11-14 MED ORDER — QVAR REDIHALER 80 MCG/ACT IN AERB: 2.0000 | INHALATION_SPRAY | Freq: Two times a day (BID) | RESPIRATORY_TRACT | 2 refills | Status: DC

## 2019-11-15 ENCOUNTER — Ambulatory Visit (INDEPENDENT_AMBULATORY_CARE_PROVIDER_SITE_OTHER): Payer: BC Managed Care – PPO

## 2019-11-15 ENCOUNTER — Other Ambulatory Visit: Payer: BC Managed Care – PPO | Admitting: Adult Health

## 2019-11-15 DIAGNOSIS — J309 Allergic rhinitis, unspecified: Secondary | ICD-10-CM

## 2019-11-20 ENCOUNTER — Ambulatory Visit: Payer: BC Managed Care – PPO | Admitting: Cardiology

## 2019-11-22 ENCOUNTER — Ambulatory Visit (INDEPENDENT_AMBULATORY_CARE_PROVIDER_SITE_OTHER): Payer: BC Managed Care – PPO

## 2019-11-22 DIAGNOSIS — J309 Allergic rhinitis, unspecified: Secondary | ICD-10-CM | POA: Diagnosis not present

## 2019-11-22 LAB — ALLERGY PANEL 18, NUT MIX GROUP
Allergen Coconut IgE: <0.1 kU/L
F020-IgE Almond: 1.48 kU/L — AB
F202-IgE Cashew Nut: <0.1 kU/L
Hazelnut (Filbert) IgE: 6.97 kU/L — AB
Pecan Nut IgE: <0.1 kU/L
Sesame Seed IgE: <0.1 kU/L

## 2019-11-22 LAB — ALLERGEN AVOCADO F96: F096-IgE Avocado: <0.1 kU/L

## 2019-11-22 LAB — MILK COMPONENT PANEL
F076-IgE Alpha Lactalbumin: <0.1 kU/L
F077-IgE Beta Lactoglobulin: <0.1 kU/L
F078-IgE Casein: 0.11 kU/L — AB

## 2019-11-22 LAB — IGE PEANUT COMPONENT PROFILE
F352-IgE Ara h 8: 9.74 kU/L — AB
F422-IgE Ara h 1: <0.1 kU/L
F423-IgE Ara h 2: <0.1 kU/L
F424-IgE Ara h 3: <0.1 kU/L
F427-IgE Ara h 9: <0.1 kU/L
F447-IgE Ara h 6: <0.1 kU/L

## 2019-11-22 LAB — ALLERGEN, BRAZIL NUT, F18: Brazil Nut IgE: <0.1 kU/L

## 2019-11-22 LAB — ALLERGEN, TURKEY, F284: Allergen Turkey IgE: <0.1 kU/L

## 2019-11-22 LAB — ALLERGY PANEL 19, SEAFOOD GROUP
Allergen Salmon IgE: <0.1 kU/L
Catfish: <0.1 kU/L
Codfish IgE: <0.1 kU/L
F023-IgE Crab: 0.18 kU/L — AB
F080-IgE Lobster: <0.1 kU/L
Shrimp IgE: 0.25 kU/L — AB
Tuna: <0.1 kU/L

## 2019-11-22 LAB — ALLERGEN PROFILE, BASIC FOOD
Allergen Corn, IgE: <0.1 kU/L
Beef IgE: 1.6 kU/L — AB
Chocolate/Cacao IgE: <0.1 kU/L
Egg, Whole IgE: <0.1 kU/L
Food Mix (Seafoods) IgE: NEGATIVE
Milk IgE: 2.27 kU/L — AB
Peanut IgE: 0.3 kU/L — AB
Pork IgE: 14 kU/L — AB
Soybean IgE: <0.1 kU/L
Wheat IgE: 0.17 kU/L — AB

## 2019-11-22 LAB — ALLERGEN PISTACHIO F203: F203-IgE Pistachio Nut: 0.16 kU/L — AB

## 2019-11-22 LAB — ALLERGEN WALNUT F256: Walnut IgE: 0.13 kU/L — AB

## 2019-11-22 LAB — EGG COMPONENT PANEL
F232-IgE Ovalbumin: <0.1 kU/L
F233-IgE Ovomucoid: <0.1 kU/L

## 2019-11-22 LAB — ALLERGEN, CHICKEN F83: Chicken IgE: <0.1 kU/L

## 2019-11-23 ENCOUNTER — Encounter: Payer: Self-pay | Admitting: Allergy & Immunology

## 2019-11-29 ENCOUNTER — Ambulatory Visit (INDEPENDENT_AMBULATORY_CARE_PROVIDER_SITE_OTHER): Payer: BC Managed Care – PPO | Admitting: *Deleted

## 2019-11-29 DIAGNOSIS — J309 Allergic rhinitis, unspecified: Secondary | ICD-10-CM

## 2019-12-06 ENCOUNTER — Ambulatory Visit (INDEPENDENT_AMBULATORY_CARE_PROVIDER_SITE_OTHER): Payer: BC Managed Care – PPO | Admitting: *Deleted

## 2019-12-06 DIAGNOSIS — J309 Allergic rhinitis, unspecified: Secondary | ICD-10-CM

## 2019-12-13 ENCOUNTER — Ambulatory Visit (INDEPENDENT_AMBULATORY_CARE_PROVIDER_SITE_OTHER): Payer: BC Managed Care – PPO

## 2019-12-13 DIAGNOSIS — J309 Allergic rhinitis, unspecified: Secondary | ICD-10-CM

## 2019-12-20 ENCOUNTER — Ambulatory Visit (INDEPENDENT_AMBULATORY_CARE_PROVIDER_SITE_OTHER): Payer: BC Managed Care – PPO

## 2019-12-20 DIAGNOSIS — J309 Allergic rhinitis, unspecified: Secondary | ICD-10-CM | POA: Diagnosis not present

## 2019-12-26 ENCOUNTER — Other Ambulatory Visit: Payer: Self-pay | Admitting: Allergy & Immunology

## 2019-12-26 NOTE — Telephone Encounter (Signed)
Can we call and confirm with the patient?  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 With the avoidance of red meat and cows milk, her symptoms were improving.  At least that is what I thought.  Malachi Bonds, MD [facility]

## 2019-12-26 NOTE — Telephone Encounter (Signed)
Pt requesting refill of prednisone thru pharmacy is it ok to send in refills?

## 2019-12-26 NOTE — Telephone Encounter (Signed)
Pt said you told her that she does not have alpha gal just an intolerance and she had just happen to not take a prednisone that day, but she feels more comfortable with the prednisone on her days of the shots.

## 2019-12-26 NOTE — Telephone Encounter (Signed)
Dr gallagher sent in prednisone for pt

## 2019-12-27 ENCOUNTER — Ambulatory Visit (INDEPENDENT_AMBULATORY_CARE_PROVIDER_SITE_OTHER): Payer: BC Managed Care – PPO

## 2019-12-27 DIAGNOSIS — J309 Allergic rhinitis, unspecified: Secondary | ICD-10-CM

## 2020-01-03 ENCOUNTER — Ambulatory Visit (INDEPENDENT_AMBULATORY_CARE_PROVIDER_SITE_OTHER): Payer: BC Managed Care – PPO

## 2020-01-03 DIAGNOSIS — J309 Allergic rhinitis, unspecified: Secondary | ICD-10-CM | POA: Diagnosis not present

## 2020-01-09 DIAGNOSIS — L700 Acne vulgaris: Secondary | ICD-10-CM | POA: Diagnosis not present

## 2020-01-09 DIAGNOSIS — D225 Melanocytic nevi of trunk: Secondary | ICD-10-CM | POA: Diagnosis not present

## 2020-01-09 DIAGNOSIS — Z1283 Encounter for screening for malignant neoplasm of skin: Secondary | ICD-10-CM | POA: Diagnosis not present

## 2020-01-09 DIAGNOSIS — D45 Polycythemia vera: Secondary | ICD-10-CM | POA: Diagnosis not present

## 2020-01-10 ENCOUNTER — Ambulatory Visit (INDEPENDENT_AMBULATORY_CARE_PROVIDER_SITE_OTHER): Payer: BC Managed Care – PPO

## 2020-01-10 DIAGNOSIS — J309 Allergic rhinitis, unspecified: Secondary | ICD-10-CM

## 2020-01-17 ENCOUNTER — Ambulatory Visit (INDEPENDENT_AMBULATORY_CARE_PROVIDER_SITE_OTHER): Payer: BC Managed Care – PPO

## 2020-01-17 DIAGNOSIS — J309 Allergic rhinitis, unspecified: Secondary | ICD-10-CM | POA: Diagnosis not present

## 2020-01-19 ENCOUNTER — Other Ambulatory Visit: Payer: Self-pay | Admitting: Allergy & Immunology

## 2020-01-19 NOTE — Telephone Encounter (Signed)
Called and left a message for patient to call the office in regards to this refill.

## 2020-01-19 NOTE — Telephone Encounter (Signed)
Spoke with patient and asked how she was doing and if she requested this medication refill. Patient stated she did not request it, that she just refilled it on 12/26/2019. Patient stated that she still has about 16 left. She stated that she doesn't need it at the moment however she keeps it on hand in case of emergencies if Dr. Dellis Anes wanted to go ahead and refill it for when she needs it. Dr. Dellis Anes please advise.

## 2020-01-24 ENCOUNTER — Ambulatory Visit (INDEPENDENT_AMBULATORY_CARE_PROVIDER_SITE_OTHER): Payer: BC Managed Care – PPO

## 2020-01-24 ENCOUNTER — Encounter (HOSPITAL_COMMUNITY): Payer: Self-pay | Admitting: Surgery

## 2020-01-24 ENCOUNTER — Other Ambulatory Visit: Payer: Self-pay

## 2020-01-24 DIAGNOSIS — J309 Allergic rhinitis, unspecified: Secondary | ICD-10-CM | POA: Diagnosis not present

## 2020-01-25 ENCOUNTER — Inpatient Hospital Stay (HOSPITAL_COMMUNITY): Payer: BC Managed Care – PPO | Attending: Hematology | Admitting: Hematology

## 2020-01-25 ENCOUNTER — Inpatient Hospital Stay (HOSPITAL_COMMUNITY): Payer: BC Managed Care – PPO

## 2020-01-25 ENCOUNTER — Encounter (HOSPITAL_COMMUNITY): Payer: Self-pay | Admitting: Hematology

## 2020-01-25 VITALS — BP 134/94 | HR 94 | Temp 97.5°F | Resp 16 | Ht 68.0 in | Wt 167.2 lb

## 2020-01-25 DIAGNOSIS — I7381 Erythromelalgia: Secondary | ICD-10-CM | POA: Diagnosis not present

## 2020-01-25 DIAGNOSIS — D751 Secondary polycythemia: Secondary | ICD-10-CM | POA: Diagnosis not present

## 2020-01-25 DIAGNOSIS — J45909 Unspecified asthma, uncomplicated: Secondary | ICD-10-CM | POA: Diagnosis not present

## 2020-01-25 DIAGNOSIS — F419 Anxiety disorder, unspecified: Secondary | ICD-10-CM

## 2020-01-25 DIAGNOSIS — R42 Dizziness and giddiness: Secondary | ICD-10-CM | POA: Diagnosis not present

## 2020-01-25 DIAGNOSIS — F17210 Nicotine dependence, cigarettes, uncomplicated: Secondary | ICD-10-CM | POA: Diagnosis not present

## 2020-01-25 DIAGNOSIS — D582 Other hemoglobinopathies: Secondary | ICD-10-CM

## 2020-01-25 LAB — COMPREHENSIVE METABOLIC PANEL
ALT: 16 U/L (ref 0–44)
AST: 16 U/L (ref 15–41)
Albumin: 4.2 g/dL (ref 3.5–5.0)
Alkaline Phosphatase: 76 U/L (ref 38–126)
Anion gap: 10 (ref 5–15)
BUN: 13 mg/dL (ref 6–20)
CO2: 25 mmol/L (ref 22–32)
Calcium: 8.9 mg/dL (ref 8.9–10.3)
Chloride: 102 mmol/L (ref 98–111)
Creatinine, Ser: 0.81 mg/dL (ref 0.44–1.00)
GFR calc Af Amer: >60 mL/min (ref >60)
GFR calc non Af Amer: >60 mL/min (ref >60)
Glucose, Bld: 82 mg/dL (ref 70–99)
Potassium: 3.8 mmol/L (ref 3.5–5.1)
Sodium: 137 mmol/L (ref 135–145)
Total Bilirubin: 1.1 mg/dL (ref 0.3–1.2)
Total Protein: 7.2 g/dL (ref 6.5–8.1)

## 2020-01-25 LAB — CBC WITH DIFFERENTIAL/PLATELET
Abs Immature Granulocytes: 0.02 10*3/uL (ref 0.00–0.07)
Basophils Absolute: 0 10*3/uL (ref 0.0–0.1)
Basophils Relative: 1 %
Eosinophils Absolute: 0.1 10*3/uL (ref 0.0–0.5)
Eosinophils Relative: 1 %
HCT: 63.1 % — ABNORMAL HIGH (ref 36.0–46.0)
Hemoglobin: 20.2 g/dL — ABNORMAL HIGH (ref 12.0–15.0)
Immature Granulocytes: 0 %
Lymphocytes Relative: 22 %
Lymphs Abs: 1.6 10*3/uL (ref 0.7–4.0)
MCH: 30.7 pg (ref 26.0–34.0)
MCHC: 32 g/dL (ref 30.0–36.0)
MCV: 95.8 fL (ref 80.0–100.0)
Monocytes Absolute: 0.6 10*3/uL (ref 0.1–1.0)
Monocytes Relative: 9 %
Neutro Abs: 4.8 10*3/uL (ref 1.7–7.7)
Neutrophils Relative %: 67 %
Platelets: 180 10*3/uL (ref 150–400)
RBC: 6.59 MIL/uL — ABNORMAL HIGH (ref 3.87–5.11)
RDW: 17.4 % — ABNORMAL HIGH (ref 11.5–15.5)
WBC: 7.1 10*3/uL (ref 4.0–10.5)
nRBC: 0 % (ref 0.0–0.2)

## 2020-01-25 LAB — LACTATE DEHYDROGENASE: LDH: 169 U/L (ref 98–192)

## 2020-01-25 LAB — FERRITIN: Ferritin: 10 ng/mL — ABNORMAL LOW (ref 11–307)

## 2020-01-25 NOTE — Progress Notes (Signed)
CONSULT NOTE  Patient Care Team: Elfredia Nevins, MD as PCP - General (Internal Medicine) Wyline Mood Dorothe Pea, MD as PCP - Cardiology (Cardiology)  CHIEF COMPLAINTS/PURPOSE OF CONSULTATION: Elevated hemoglobin  HISTORY OF PRESENTING ILLNESS:  Lindsey Jensen 34 y.o. female was sent here by her PCP for elevated hemoglobin.  Patient reports she has known about her elevated hemoglobin since 2014.  She reports that she stopped smoking for a year and her hemoglobin returned to normal.  She reports during her pregnancy she was not smoking and her hemoglobin was normal then as well.  She reports she has had severe anxiety over the past 2 years.  She reports all of her signs and symptoms started after having her child in 2019.  Patient is having flushing only in her fingers which makes them feel warm and they turn reddish-purple.  She reports she has dizziness and anxiety daily.  She also reports having multiple allergies since moving from [place].  Patient reports she started smoking in college in 2007 and smokes about a half a pack per day.  She also reports drinking a couple glasses of red wine every night.  Patient denies any obstructive sleep apnea.  She has been recently diagnosed with asthma that flares up when she eats certain foods.  Patient denies any diuretics or antibiotics.  Patient denies any aquagenic pruritus.  Patient denies a history of blood clots.  Patient denies any recent chest pain on exertion, shortness of breath on minimal exertion, presyncopal episodes, or palpitations.  Patient has not noticed any recent bleeding such as epistasis, hematuria or hematochezia.  Patient has no prior history or diagnosis of cancer.  Her age-appropriate screening programs are up-to-date.  Patient has never donated blood or received any blood transfusions.  Patient is not taking any oral iron supplementations Patient has a negative family history for polycythemia or cancers. 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  She is not around any harmful chemicals.  But also notes that there is mold visible in the plant.   MEDICAL HISTORY:  Past Medical History:  Diagnosis Date  . Contraceptive education 01/26/2014  . Medical history non-contributory   . Moderate persistent asthma, uncomplicated 05/19/2019    SURGICAL HISTORY: Past Surgical History:  Procedure Laterality Date  . NO PAST SURGERIES      SOCIAL HISTORY: Social History   Socioeconomic History  . Marital status: Married    Spouse name: Not on file  . Number of children: 1  . Years of education: Not on file  . Highest education level: Not on file  Occupational History    Employer: Airline pilot  Tobacco Use  . Smoking status: Current Every Day Smoker    Packs/day: 0.50    Years: 14.00    Pack years: 7.00    Types: Cigarettes  . Smokeless tobacco: Never Used  Substance and Sexual Activity  . Alcohol use: Yes    Alcohol/week: 14.0 standard drinks    Types: 14 Glasses of wine per week    Comment: glass of wine every night    . Drug use: No  . Sexual activity: Yes    Birth control/protection: None  Other Topics Concern  . Not on file  Social History Narrative  . Not on file   Social Determinants of Health   Financial Resource Strain:   . Difficulty of Paying Living Expenses: Not on file  Food Insecurity:   . Worried  About Running Out of Food in the Last Year: Not on file  . Ran Out of Food in the Last Year: Not on file  Transportation Needs:   . Lack of Transportation (Medical): Not on file  . Lack of Transportation (Non-Medical): Not on file  Physical Activity:   . Days of Exercise per Week: Not on file  . Minutes of Exercise per Session: Not on file  Stress:   . Feeling of Stress : Not on file  Social Connections:   . Frequency of Communication with Friends and Family: Not on file  . Frequency of Social Gatherings with Friends and Family: Not on file  .  Attends Religious Services: Not on file  . Active Member of Clubs or Organizations: Not on file  . Attends BankerClub or Organization Meetings: Not on file  . Marital Status: Not on file  Intimate Partner Violence:   . Fear of Current or Ex-Partner: Not on file  . Emotionally Abused: Not on file  . Physically Abused: Not on file  . Sexually Abused: Not on file    FAMILY HISTORY: Family History  Problem Relation Age of Onset  . Heart disease Father        heart attack  . Allergic rhinitis Father   . Diabetes Mother   . Depression Mother   . Anxiety disorder Mother   . Heart disease Mother   . Heart disease Maternal Grandmother        CHF  . Asthma Maternal Grandmother   . Diabetes Maternal Grandmother   . High Cholesterol Sister   . Kidney Stones Brother   . Alzheimer's disease Maternal Grandfather   . Asthma Maternal Grandfather   . Diabetes Maternal Uncle   . Angioedema Neg Hx   . Immunodeficiency Neg Hx     ALLERGIES:  is allergic to beef-derived products; dairy aid [lactase]; meat [alpha-gal]; pork-derived products; and shellfish allergy.  MEDICATIONS:  Current Outpatient Medications  Medication Sig Dispense Refill  . beclomethasone (QVAR REDIHALER) 80 MCG/ACT inhaler Inhale 2 puffs into the lungs 2 (two) times daily. 3 g 2  . EPINEPHrine (EPIPEN 2-PAK) 0.3 mg/0.3 mL IJ SOAJ injection Use as directed for severe allergic reaction 2 each 2  . fexofenadine (ALLEGRA) 180 MG tablet Take 180 mg by mouth daily.    Marland Kitchen. levalbuterol (XOPENEX HFA) 45 MCG/ACT inhaler Inhale 1-2 puffs into the lungs every 4 (four) hours as needed for wheezing.    . predniSONE (DELTASONE) 10 MG tablet TAKE ONE TABLET ON SHOT DAYS. 30 tablet 0   No current facility-administered medications for this visit.    REVIEW OF SYSTEMS:   Constitutional: Denies fevers, chills or abnormal night sweats Respiratory: Denies cough, dyspnea or wheezes Cardiovascular: Denies palpitation, chest discomfort or lower  extremity swelling Gastrointestinal:  Denies nausea, heartburn or change in bowel habits Skin: Denies abnormal skin rashes, +red fingers Lymphatics: Denies new lymphadenopathy or easy bruising Neurological:Denies numbness, tingling or new weaknesses Behavioral/Psych: Mood is stable, no new changes  All other systems were reviewed with the patient and are negative.  PHYSICAL EXAMINATION: ECOG PERFORMANCE STATUS: 1 - Symptomatic but completely ambulatory  Vitals:   01/25/20 1300  BP: (!) 134/94  Pulse: 94  Resp: 16  Temp: (!) 97.5 F (36.4 C)  SpO2: 100%   Filed Weights   01/25/20 1300  Weight: 167 lb 3 oz (75.8 kg)    GENERAL:alert, no distress and comfortable SKIN: skin color, texture, turgor are normal, no rashes or significant  lesions, + red fingers NECK: supple, thyroid normal size, non-tender, without nodularity LYMPH:  no palpable lymphadenopathy in the cervical, axillary or inguinal LUNGS: clear to auscultation and percussion with normal breathing effort HEART: regular rate & rhythm and no murmurs and no lower extremity edema ABDOMEN:abdomen soft, non-tender and normal bowel sounds Musculoskeletal:no cyanosis of digits and no clubbing  PSYCH: alert & oriented x 3 with fluent speech NEURO: no focal motor/sensory deficits  LABORATORY DATA:  I have reviewed the data as listed Recent Results (from the past 2160 hour(s))  Egg Component Panel     Status: None   Collection Time: 11/20/19  1:39 PM  Result Value Ref Range   F232-IgE Ovalbumin <0.10 Class 0 kU/L   F233-IgE Ovomucoid <0.10 Class 0 kU/L  Milk Component Panel     Status: Abnormal   Collection Time: 11/20/19  1:39 PM  Result Value Ref Range   Class Description Allergens Comment     Comment:     Levels of Specific IgE       Class  Description of Class     ---------------------------  -----  --------------------                    < 0.10         0         Negative            0.10 -    0.31         0/I        Equivocal/Low            0.32 -    0.55         I         Low            0.56 -    1.40         II        Moderate            1.41 -    3.90         III       High            3.91 -   19.00         IV        Very High           19.01 -  100.00         V         Very High                   >100.00         VI        Very High    F076-IgE Alpha Lactalbumin <0.10 Class 0 kU/L   F077-IgE Beta Lactoglobulin <0.10 Class 0 kU/L   F078-IgE Casein 0.11 (A) Class 0/I kU/L  Allergen, [place], C585     Status: None   Collection Time: 11/20/19  1:39 PM  Result Value Ref Range   Allergen [place] IgE <0.10 Class 0 kU/L  Chicken IgE     Status: None   Collection Time: 11/20/19  1:39 PM  Result Value Ref Range   Chicken IgE <0.10 Class 0 kU/L    Comment:     Levels of Specific IgE       Class  Description of Class     ---------------------------  -----  --------------------                    <  0.10         0         Negative            0.10 -    0.31         0/I       Equivocal/Low            0.32 -    0.55         I         Low            0.56 -    1.40         II        Moderate            1.41 -    3.90         III       High            3.91 -   19.00         IV        Very High           19.01 -  100.00         V         Very High                   >100.00         VI        Very High   Allergen Profile, Basic Food     Status: Abnormal   Collection Time: 11/20/19  1:40 PM  Result Value Ref Range   Class Description Allergens Comment     Comment:     Levels of Specific IgE       Class  Description of Class     ---------------------------  -----  --------------------                    < 0.10         0         Negative            0.10 -    0.31         0/I       Equivocal/Low            0.32 -    0.55         I         Low            0.56 -    1.40         II        Moderate            1.41 -    3.90         III       High            3.91 -   19.00         IV        Very High           19.01  -  100.00         V         Very High                   >100.00         VI        Very  High    Milk IgE 2.27 (A) Class III kU/L   Wheat IgE 0.17 (A) Class 0/I kU/L   Allergen Corn, IgE <0.10 Class 0 kU/L   Peanut IgE 0.30 (A) Class 0/I kU/L   Soybean IgE <0.10 Class 0 kU/L   Pork IgE 14.00 (A) Class IV kU/L   Beef IgE 1.60 (A) Class III kU/L   Food Mix (Seafoods) IgE Negative     Comment: Allergens in this mix are:  Teacher, early years/pre  Shrimp  [employer], Whole IgE <0.10 Class 0 kU/L   Chocolate/Cacao IgE <0.10 Class 0 kU/L  Allergen, [place] Nut, f18     Status: None   Collection Time: 11/20/19  1:40 PM  Result Value Ref Range   [place] Nut IgE <0.10 Class 0 kU/L  Allergy Panel 18, Nut Mix Group     Status: Abnormal   Collection Time: 11/20/19  1:40 PM  Result Value Ref Range   Sesame Seed IgE <0.10 Class 0 kU/L   Hazelnut ([place]) IgE 6.97 (A) Class IV kU/L   F020-IgE Almond 1.48 (A) Class III kU/L   Allergen Coconut IgE <0.10 Class 0 kU/L   Pecan Nut IgE <0.10 Class 0 kU/L   F202-IgE Cashew Nut <0.10 Class 0 kU/L  IgE Peanut Component Profile     Status: Abnormal   Collection Time: 11/20/19  1:40 PM  Result Value Ref Range   F422-IgE Ara h 1 <0.10 Class 0 kU/L   F423-IgE Ara h 2 <0.10 Class 0 kU/L   F424-IgE Ara h 3 <0.10 Class 0 kU/L   F447-IgE Ara h 6 <0.10 Class 0 kU/L   F352-IgE Ara h 8 9.74 (A) Class IV kU/L   F427-IgE Ara h 9 <0.10 Class 0 kU/L  Allergen [facility] f256     Status: Abnormal   Collection Time: 11/20/19  1:40 PM  Result Value Ref Range   Walnut IgE 0.13 (A) Class 0/I kU/L  Allergen Pistachio f203     Status: Abnormal   Collection Time: 11/20/19  1:40 PM  Result Value Ref Range   F203-IgE Pistachio Nut 0.16 (A) Class 0/I kU/L  Allergen Avocado f96     Status: None   Collection Time: 11/20/19  1:40 PM  Result Value Ref Range   F096-IgE Avocado <0.10 Class 0 kU/L  Allergy Panel 19, Seafood Group     Status: Abnormal   Collection Time:  11/20/19  1:40 PM  Result Value Ref Range   Codfish IgE <0.10 Class 0 kU/L   F023-IgE Crab 0.18 (A) Class 0/I kU/L   Shrimp IgE 0.25 (A) Class 0/I kU/L   Tuna <0.10 Class 0 kU/L   Allergen Salmon IgE <0.10 Class 0 kU/L   F080-IgE Lobster <0.10 Class 0 kU/L   Catfish <0.10 Class 0 kU/L  Ferritin     Status: Abnormal   Collection Time: 01/25/20  3:21 PM  Result Value Ref Range   Ferritin 10 (L) 11 - 307 ng/mL    Comment: Performed at [facility], [address], [place],  [address]  Comprehensive metabolic panel     Status: None   Collection Time: 01/25/20  3:22 PM  Result Value Ref Range   Sodium 137 135 - 145 mmol/L   Potassium 3.8 3.5 - 5.1 mmol/L   Chloride 102 98 - 111 mmol/L   CO2 25 22 - 32 mmol/L   Glucose, Bld 82 70 - 99 mg/dL   BUN  13 6 - 20 mg/dL   Creatinine, Ser 4.58 0.44 - 1.00 mg/dL   Calcium 8.9 8.9 - 09.9 mg/dL   Total Protein 7.2 6.5 - 8.1 g/dL   Albumin 4.2 3.5 - 5.0 g/dL   AST 16 15 - 41 U/L   ALT 16 0 - 44 U/L   Alkaline Phosphatase 76 38 - 126 U/L   Total Bilirubin 1.1 0.3 - 1.2 mg/dL   GFR calc non Af Amer >60 >60 mL/min   GFR calc Af Amer >60 >60 mL/min   Anion gap 10 5 - 15    Comment: Performed at [facility], [address] [place], [place] [address]  CBC with Differential/Platelet     Status: Abnormal   Collection Time: 01/25/20  3:22 PM  Result Value Ref Range   WBC 7.1 4.0 - 10.5 K/uL   RBC 6.59 (H) 3.87 - 5.11 MIL/uL   Hemoglobin 20.2 (H) 12.0 - 15.0 g/dL   HCT 50.5 (H) 39.7 - 67.3 %   MCV 95.8 80.0 - 100.0 fL   MCH 30.7 26.0 - 34.0 pg   MCHC 32.0 30.0 - 36.0 g/dL   RDW 41.9 (H) 37.9 - 02.4 %   Platelets 180 150 - 400 K/uL   nRBC 0.0 0.0 - 0.2 %   Neutrophils Relative % 67 %   Neutro Abs 4.8 1.7 - 7.7 K/uL   Lymphocytes Relative 22 %   Lymphs Abs 1.6 0.7 - 4.0 K/uL   Monocytes Relative 9 %   Monocytes Absolute 0.6 0.1 - 1.0 K/uL   Eosinophils Relative 1 %   Eosinophils Absolute 0.1 0.0 - 0.5 K/uL   Basophils Relative 1 %    Basophils Absolute 0.0 0.0 - 0.1 K/uL   Immature Granulocytes 0 %   Abs Immature Granulocytes 0.02 0.00 - 0.07 K/uL    Comment: Performed at [facility], [address], [place], [place] [address]  Lactate dehydrogenase     Status: None   Collection Time: 01/25/20  3:22 PM  Result Value Ref Range   LDH 169 98 - 192 U/L    Comment: Performed at [facility], [address], [place], [place] [address]    RADIOGRAPHIC STUDIES: I have personally reviewed the radiological images as listed and agreed with the findings in the report. I have independently examined and interviewed this patient.  I agree with the HPI note written by my nurse practitioner Corliss Skains, FNP.  I have independently formulated my assessment and plan which is as follows.  ASSESSMENT & PLAN:  Erythrocytosis 1.  Elevated hemoglobin: -She was seen at the request of Dr. Margo Aye for elevated hemoglobin. -Her labs on 01/11/2020 shows hemoglobin around 19 with hematocrit more than 55.  Platelet count and white count was normal. -We have reviewed her labs which showed some degree of elevated hemoglobin for the last few years. -Smokes about half pack per day of cigarettes since 2007.  Does not report any hematuria. -She reports symptoms of erythromelalgia with redness of her fingers starting from the knuckles distally, lasting few minutes to few hours, with burning type of pain.  She showed me pictures on her cell phone of the same. -CT angio PE on 02/03/2019 showed normal upper abdominal organs. -She denies any headaches or vision changes.  Denies any aquagenic pruritus.  She is not on any diuretics. -No family history of polycythemia or phlebotomy. -Differential diagnosis includes polycythemia vera. -We will check a CBC, also check serum erythropoietin and JAK2 V6  38F with reflex mutation testing. -We will see her back in 2 weeks to discuss results.     All questions were answered. The patient knows to call the clinic with any  problems, questions or concerns.      Doreatha MassedSreedhar Latasha Puskas, MD 01/25/20 6:04 PM

## 2020-01-25 NOTE — Assessment & Plan Note (Addendum)
1.  Elevated hemoglobin: -She was seen at the request of Dr. Margo Aye for elevated hemoglobin. -Her labs on 01/11/2020 shows hemoglobin around 19 with hematocrit more than 55.  Platelet count and white count was normal. -We have reviewed her labs which showed some degree of elevated hemoglobin for the last few years. -Smokes about half pack per day of cigarettes since 2007.  Does not report any hematuria. -She reports symptoms of erythromelalgia with redness of her fingers starting from the knuckles distally, lasting few minutes to few hours, with burning type of pain.  She showed me pictures on her cell phone of the same. -CT angio PE on 02/03/2019 showed normal upper abdominal organs. -She denies any headaches or vision changes.  Denies any aquagenic pruritus.  She is not on any diuretics. -No family history of polycythemia or phlebotomy. -Differential diagnosis includes polycythemia vera. -We will check a CBC, also check serum erythropoietin and JAK2 V6 63F with reflex mutation testing. -We will see her back in 2 weeks to discuss results.

## 2020-01-25 NOTE — Patient Instructions (Signed)
[facility] at [facility] Discharge Instructions  Follow up in 2-3 weeks   Thank you for choosing [facility] at [facility] to provide your oncology and hematology care.  To afford each patient quality time with our provider, please arrive at least 15 minutes before your scheduled appointment time.   If you have a lab appointment with the Cancer Center please come in thru the Main Entrance and check in at the main information desk.  You need to re-schedule your appointment should you arrive 10 or more minutes late.  We strive to give you quality time with our providers, and arriving late affects you and other patients whose appointments are after yours.  Also, if you no show three or more times for appointments you may be dismissed from the clinic at the providers discretion.     Again, thank you for choosing [facility].  Our hope is that these requests will decrease the amount of time that you wait before being seen by our physicians.       _____________________________________________________________  Should you have questions after your visit to [facility], please contact our office at [phone] between the hours of 8:00 a.m. and 4:30 p.m.  Voicemails left after 4:00 p.m. will not be returned until the following business day.  For prescription refill requests, have your pharmacy contact our office and allow 72 hours.    Due to Covid, you will need to wear a mask upon entering the hospital. If you do not have a mask, a mask will be given to you at the Main Entrance upon arrival. For doctor visits, patients may have 1 support person with them. For treatment visits, patients can not have anyone with them due to social distancing guidelines and our immunocompromised population.      

## 2020-01-26 LAB — ERYTHROPOIETIN: Erythropoietin: 12.7 m[IU]/mL (ref 2.6–18.5)

## 2020-01-31 ENCOUNTER — Ambulatory Visit (INDEPENDENT_AMBULATORY_CARE_PROVIDER_SITE_OTHER): Payer: BC Managed Care – PPO

## 2020-01-31 DIAGNOSIS — J309 Allergic rhinitis, unspecified: Secondary | ICD-10-CM | POA: Diagnosis not present

## 2020-02-07 ENCOUNTER — Ambulatory Visit (INDEPENDENT_AMBULATORY_CARE_PROVIDER_SITE_OTHER): Payer: BC Managed Care – PPO

## 2020-02-07 DIAGNOSIS — J309 Allergic rhinitis, unspecified: Secondary | ICD-10-CM | POA: Diagnosis not present

## 2020-02-14 ENCOUNTER — Ambulatory Visit (INDEPENDENT_AMBULATORY_CARE_PROVIDER_SITE_OTHER): Payer: BC Managed Care – PPO

## 2020-02-14 DIAGNOSIS — J309 Allergic rhinitis, unspecified: Secondary | ICD-10-CM | POA: Diagnosis not present

## 2020-02-15 ENCOUNTER — Inpatient Hospital Stay (HOSPITAL_COMMUNITY): Payer: BC Managed Care – PPO

## 2020-02-15 ENCOUNTER — Other Ambulatory Visit (HOSPITAL_COMMUNITY): Payer: Self-pay | Admitting: *Deleted

## 2020-02-15 ENCOUNTER — Ambulatory Visit (HOSPITAL_COMMUNITY): Payer: BC Managed Care – PPO | Admitting: Hematology

## 2020-02-15 ENCOUNTER — Other Ambulatory Visit: Payer: Self-pay

## 2020-02-15 DIAGNOSIS — F419 Anxiety disorder, unspecified: Secondary | ICD-10-CM | POA: Diagnosis not present

## 2020-02-15 DIAGNOSIS — F17210 Nicotine dependence, cigarettes, uncomplicated: Secondary | ICD-10-CM | POA: Diagnosis not present

## 2020-02-15 DIAGNOSIS — D751 Secondary polycythemia: Secondary | ICD-10-CM | POA: Diagnosis not present

## 2020-02-15 DIAGNOSIS — I7381 Erythromelalgia: Secondary | ICD-10-CM | POA: Diagnosis not present

## 2020-02-15 DIAGNOSIS — R42 Dizziness and giddiness: Secondary | ICD-10-CM | POA: Diagnosis not present

## 2020-02-15 DIAGNOSIS — J45909 Unspecified asthma, uncomplicated: Secondary | ICD-10-CM | POA: Diagnosis not present

## 2020-02-15 DIAGNOSIS — D582 Other hemoglobinopathies: Secondary | ICD-10-CM | POA: Diagnosis not present

## 2020-02-15 LAB — CARBOXYHEMOGLOBIN - COOX: Carboxyhemoglobin: 4.2 % — ABNORMAL HIGH (ref 0.5–1.5)

## 2020-02-19 ENCOUNTER — Telehealth (HOSPITAL_COMMUNITY): Payer: Self-pay | Admitting: *Deleted

## 2020-02-19 ENCOUNTER — Encounter (HOSPITAL_COMMUNITY): Payer: Self-pay | Admitting: Hematology

## 2020-02-19 ENCOUNTER — Inpatient Hospital Stay (HOSPITAL_COMMUNITY): Payer: BC Managed Care – PPO

## 2020-02-19 ENCOUNTER — Inpatient Hospital Stay (HOSPITAL_COMMUNITY): Payer: BC Managed Care – PPO | Attending: Hematology | Admitting: Hematology

## 2020-02-19 ENCOUNTER — Other Ambulatory Visit (HOSPITAL_COMMUNITY): Payer: Self-pay | Admitting: *Deleted

## 2020-02-19 ENCOUNTER — Other Ambulatory Visit: Payer: Self-pay

## 2020-02-19 VITALS — BP 149/95 | HR 103 | Temp 97.5°F | Resp 19 | Wt 165.7 lb

## 2020-02-19 DIAGNOSIS — F17200 Nicotine dependence, unspecified, uncomplicated: Secondary | ICD-10-CM | POA: Diagnosis not present

## 2020-02-19 DIAGNOSIS — R200 Anesthesia of skin: Secondary | ICD-10-CM | POA: Diagnosis not present

## 2020-02-19 DIAGNOSIS — R519 Headache, unspecified: Secondary | ICD-10-CM | POA: Insufficient documentation

## 2020-02-19 DIAGNOSIS — Z8249 Family history of ischemic heart disease and other diseases of the circulatory system: Secondary | ICD-10-CM | POA: Insufficient documentation

## 2020-02-19 DIAGNOSIS — H5789 Other specified disorders of eye and adnexa: Secondary | ICD-10-CM | POA: Diagnosis not present

## 2020-02-19 DIAGNOSIS — D751 Secondary polycythemia: Secondary | ICD-10-CM | POA: Diagnosis not present

## 2020-02-19 DIAGNOSIS — R0602 Shortness of breath: Secondary | ICD-10-CM | POA: Diagnosis not present

## 2020-02-19 DIAGNOSIS — Z79899 Other long term (current) drug therapy: Secondary | ICD-10-CM | POA: Diagnosis not present

## 2020-02-19 DIAGNOSIS — M79646 Pain in unspecified finger(s): Secondary | ICD-10-CM | POA: Insufficient documentation

## 2020-02-19 DIAGNOSIS — J302 Other seasonal allergic rhinitis: Secondary | ICD-10-CM

## 2020-02-19 DIAGNOSIS — R002 Palpitations: Secondary | ICD-10-CM | POA: Diagnosis not present

## 2020-02-19 DIAGNOSIS — I7381 Erythromelalgia: Secondary | ICD-10-CM | POA: Insufficient documentation

## 2020-02-19 DIAGNOSIS — R0789 Other chest pain: Secondary | ICD-10-CM

## 2020-02-19 DIAGNOSIS — F411 Generalized anxiety disorder: Secondary | ICD-10-CM

## 2020-02-19 DIAGNOSIS — F17210 Nicotine dependence, cigarettes, uncomplicated: Secondary | ICD-10-CM | POA: Insufficient documentation

## 2020-02-19 DIAGNOSIS — R42 Dizziness and giddiness: Secondary | ICD-10-CM | POA: Diagnosis not present

## 2020-02-19 DIAGNOSIS — J3089 Other allergic rhinitis: Secondary | ICD-10-CM

## 2020-02-19 DIAGNOSIS — J4540 Moderate persistent asthma, uncomplicated: Secondary | ICD-10-CM | POA: Diagnosis not present

## 2020-02-19 LAB — CBC WITH DIFFERENTIAL/PLATELET
Abs Immature Granulocytes: 0.02 10*3/uL (ref 0.00–0.07)
Basophils Absolute: 0.1 10*3/uL (ref 0.0–0.1)
Basophils Relative: 1 %
Eosinophils Absolute: 0.1 10*3/uL (ref 0.0–0.5)
Eosinophils Relative: 2 %
HCT: 61.5 % — ABNORMAL HIGH (ref 36.0–46.0)
Hemoglobin: 20 g/dL — ABNORMAL HIGH (ref 12.0–15.0)
Immature Granulocytes: 0 %
Lymphocytes Relative: 25 %
Lymphs Abs: 1.8 10*3/uL (ref 0.7–4.0)
MCH: 31.1 pg (ref 26.0–34.0)
MCHC: 32.5 g/dL (ref 30.0–36.0)
MCV: 95.5 fL (ref 80.0–100.0)
Monocytes Absolute: 0.6 10*3/uL (ref 0.1–1.0)
Monocytes Relative: 8 %
Neutro Abs: 4.7 10*3/uL (ref 1.7–7.7)
Neutrophils Relative %: 64 %
Platelets: 191 10*3/uL (ref 150–400)
RBC: 6.44 MIL/uL — ABNORMAL HIGH (ref 3.87–5.11)
RDW: 17.6 % — ABNORMAL HIGH (ref 11.5–15.5)
WBC: 7.4 10*3/uL (ref 4.0–10.5)
nRBC: 0 % (ref 0.0–0.2)

## 2020-02-19 LAB — COMPREHENSIVE METABOLIC PANEL
ALT: 16 U/L (ref 0–44)
AST: 15 U/L (ref 15–41)
Albumin: 4.2 g/dL (ref 3.5–5.0)
Alkaline Phosphatase: 71 U/L (ref 38–126)
Anion gap: 9 (ref 5–15)
BUN: 10 mg/dL (ref 6–20)
CO2: 28 mmol/L (ref 22–32)
Calcium: 9.6 mg/dL (ref 8.9–10.3)
Chloride: 101 mmol/L (ref 98–111)
Creatinine, Ser: 0.65 mg/dL (ref 0.44–1.00)
GFR calc Af Amer: >60 mL/min (ref >60)
GFR calc non Af Amer: >60 mL/min (ref >60)
Glucose, Bld: 104 mg/dL — ABNORMAL HIGH (ref 70–99)
Potassium: 4.7 mmol/L (ref 3.5–5.1)
Sodium: 138 mmol/L (ref 135–145)
Total Bilirubin: 1.5 mg/dL — ABNORMAL HIGH (ref 0.3–1.2)
Total Protein: 7.3 g/dL (ref 6.5–8.1)

## 2020-02-19 LAB — CK TOTAL AND CKMB (NOT AT ARMC)
CK, MB: UNDETERMINED ng/mL (ref 0.5–5.0)
CK, MB: 0.5 ng/mL (ref 0.5–5.0)
Relative Index: UNDETERMINED (ref 0.0–2.5)
Relative Index: INVALID (ref 0.0–2.5)
Total CK: 36 U/L — ABNORMAL LOW (ref 38–234)
Total CK: 36 U/L — ABNORMAL LOW (ref 38–234)

## 2020-02-19 LAB — TROPONIN I (HIGH SENSITIVITY): Troponin I (High Sensitivity): <2 ng/L (ref <18)

## 2020-02-19 MED ORDER — SODIUM CHLORIDE 0.9 % IV SOLN: INTRAVENOUS | Status: DC

## 2020-02-19 NOTE — Telephone Encounter (Signed)
Pt called into the clinic stating she felt fatigue,tightness in chest,and rapid heart rate. Pt stated she did not want to go to her PCP or ER. Pt wanted to come in to be seen in clinic sooner than her next appointment.I spoke with Dr. Ellin Saba and stated for pt to come in today for labs, office visit, and possible phlebotomy. Scheduler called pt to let her know of new appointment time.

## 2020-02-19 NOTE — Progress Notes (Signed)
[facility] [address], [place] [address]   CLINIC:  Medical Oncology/Hematology  PCP:  Elfredia Nevins, MD [address] [place] [address] [phone]   REASON FOR VISIT:  Follow-up for erythrocytosis.  CURRENT THERAPY: Phlebotomy.   INTERVAL HISTORY:  [name] 34 y.o. female seen on unscheduled visit today with complaints of headaches, redness of the eyes, chest pressure intermittently.  She reported right-sided headache and she seldom gets headaches.  She also reports the chest pressure lasts about 20 minutes.  She has some shortness of breath on exertion.  Appetite is 100%.  Energy levels are 75%.  She reports occasional palpitations.    REVIEW OF SYSTEMS:  Review of Systems  Respiratory: Positive for shortness of breath.   Cardiovascular: Positive for chest pain and palpitations.  Neurological: Positive for dizziness and headaches.  All other systems reviewed and are negative.    PAST MEDICAL/SURGICAL HISTORY:  Past Medical History:  Diagnosis Date  . Contraceptive education 01/26/2014  . Medical history non-contributory   . Moderate persistent asthma, uncomplicated 05/19/2019   Past Surgical History:  Procedure Laterality Date  . NO PAST SURGERIES       SOCIAL HISTORY:  Social History   Socioeconomic History  . Marital status: Married    Spouse name: Not on file  . Number of children: 1  . Years of education: Not on file  . Highest education level: Not on file  Occupational History    Employer: Airline pilot  Tobacco Use  . Smoking status: Current Every Day Smoker    Packs/day: 0.50    Years: 14.00    Pack years: 7.00    Types: Cigarettes  . Smokeless tobacco: Never Used  Substance and Sexual Activity  . Alcohol use: Yes    Alcohol/week: 14.0 standard drinks    Types: 14 Glasses of wine per week    Comment: glass of wine every night    . Drug use: No  . Sexual activity: Yes    Birth  control/protection: None  Other Topics Concern  . Not on file  Social History Narrative  . Not on file   Social Determinants of Health   Financial Resource Strain:   . Difficulty of Paying Living Expenses: Not on file  Food Insecurity:   . Worried About Programme researcher, broadcasting/film/video in the Last Year: Not on file  . Ran Out of Food in the Last Year: Not on file  Transportation Needs:   . Lack of Transportation (Medical): Not on file  . Lack of Transportation (Non-Medical): Not on file  Physical Activity:   . Days of Exercise per Week: Not on file  . Minutes of Exercise per Session: Not on file  Stress:   . Feeling of Stress : Not on file  Social Connections:   . Frequency of Communication with Friends and Family: Not on file  . Frequency of Social Gatherings with Friends and Family: Not on file  . Attends Religious Services: Not on file  . Active Member of Clubs or Organizations: Not on file  . Attends Banker Meetings: Not on file  . Marital Status: Not on file  Intimate Partner Violence:   . Fear of Current or Ex-Partner: Not on file  . Emotionally Abused: Not on file  . Physically Abused: Not on file  . Sexually Abused: Not on file    FAMILY HISTORY:  Family History  Problem Relation Age of Onset  .  Heart disease Father        heart attack  . Allergic rhinitis Father   . Diabetes Mother   . Depression Mother   . Anxiety disorder Mother   . Heart disease Mother   . Heart disease Maternal Grandmother        CHF  . Asthma Maternal Grandmother   . Diabetes Maternal Grandmother   . High Cholesterol Sister   . Kidney Stones Brother   . Alzheimer's disease Maternal Grandfather   . Asthma Maternal Grandfather   . Diabetes Maternal Uncle   . Angioedema Neg Hx   . Immunodeficiency Neg Hx     CURRENT MEDICATIONS:  Outpatient Encounter Medications as of 02/19/2020  Medication Sig  . Vitamin D, Ergocalciferol, (DRISDOL) 1.25 MG (50000 UNIT) CAPS capsule 1 CAPS ONCE  WEEKLY. ONCE THIS PRESCRIPTION IS DONE THEN PT IS TO TAKE 2000 UNITS OTC DAILY.  . [DISCONTINUED] fexofenadine (ALLEGRA) 180 MG tablet Take 180 mg by mouth daily.  . beclomethasone (QVAR REDIHALER) 80 MCG/ACT inhaler Inhale 2 puffs into the lungs 2 (two) times daily. (Patient not taking: Reported on 02/19/2020)  . EPINEPHrine (EPIPEN 2-PAK) 0.3 mg/0.3 mL IJ SOAJ injection Use as directed for severe allergic reaction (Patient not taking: Reported on 02/19/2020)  . levalbuterol (XOPENEX HFA) 45 MCG/ACT inhaler Inhale 1-2 puffs into the lungs every 4 (four) hours as needed for wheezing.  . [DISCONTINUED] predniSONE (DELTASONE) 10 MG tablet TAKE ONE TABLET ON SHOT DAYS.   No facility-administered encounter medications on file as of 02/19/2020.    ALLERGIES:  Allergies  Allergen Reactions  . Beef-Derived Products Anaphylaxis    Alpha-Gal Allergy   . Dairy Aid [Lactase] Other (See Comments)    SOB  . Meat [Alpha-Gal] Palpitations    All mammal. Symptom asthma .  Marland Kitchen Pork-Derived Products Anaphylaxis    Alpha-Gal Allergy  . Shellfish Allergy Other (See Comments)    SOB     PHYSICAL EXAM:  ECOG Performance status: 0  Vitals:   02/19/20 1342  BP: (!) 149/95  Pulse: (!) 103  Resp: 19  Temp: (!) 97.5 F (36.4 C)  SpO2: 100%   Filed Weights   02/19/20 1342  Weight: 165 lb 11.2 oz (75.2 kg)    Physical Exam Vitals reviewed.  Constitutional:      Appearance: Normal appearance.  Cardiovascular:     Rate and Rhythm: Normal rate and regular rhythm.     Heart sounds: Normal heart sounds.  Pulmonary:     Effort: Pulmonary effort is normal.     Breath sounds: Normal breath sounds.  Abdominal:     Palpations: Abdomen is soft.  Skin:    General: Skin is warm.  Neurological:     General: No focal deficit present.     Mental Status: She is alert and oriented to person, place, and time.  Psychiatric:        Mood and Affect: Mood normal.        Behavior: Behavior normal.       LABORATORY DATA:  I have reviewed the labs as listed.  CBC    Component Value Date/Time   WBC 7.4 02/19/2020 1322   RBC 6.44 (H) 02/19/2020 1322   HGB 20.0 (H) 02/19/2020 1322   HGB 12.3 03/25/2018 0907   HCT 61.5 (H) 02/19/2020 1322   HCT 35.9 03/25/2018 0907   PLT 191 02/19/2020 1322   PLT 185 03/25/2018 0907   MCV 95.5 02/19/2020 1322   MCV 92 03/25/2018  0277   MCH 31.1 02/19/2020 1322   MCHC 32.5 02/19/2020 1322   RDW 17.6 (H) 02/19/2020 1322   RDW 12.1 (L) 03/25/2018 0907   LYMPHSABS 1.8 02/19/2020 1322   MONOABS 0.6 02/19/2020 1322   EOSABS 0.1 02/19/2020 1322   BASOSABS 0.1 02/19/2020 1322   CMP Latest Ref Rng & Units 02/19/2020 01/25/2020 06/13/2019  Glucose 70 - 99 mg/dL 104(H) 82 99  BUN 6 - 20 mg/dL 10 13 9   Creatinine 0.44 - 1.00 mg/dL 0.65 0.81 0.69  Sodium 135 - 145 mmol/L 138 137 138  Potassium 3.5 - 5.1 mmol/L 4.7 3.8 3.6  Chloride 98 - 111 mmol/L 101 102 102  CO2 22 - 32 mmol/L 28 25 26   Calcium 8.9 - 10.3 mg/dL 9.6 8.9 8.9  Total Protein 6.5 - 8.1 g/dL 7.3 7.2 6.9  Total Bilirubin 0.3 - 1.2 mg/dL 1.5(H) 1.1 0.5  Alkaline Phos 38 - 126 U/L 71 76 51  AST 15 - 41 U/L 15 16 10(L)  ALT 0 - 44 U/L 16 16 12        DIAGNOSTIC IMAGING:  I have independently reviewed the scans and discussed with the patient.   ASSESSMENT & PLAN:   Erythrocytosis 1.  Erythrocytosis: -She was evaluated for increased hemoglobin from CBC on 01/11/2020 showing 19/55.  Platelet count and white count was normal. -She reported symptoms of erythromelalgia with redness of her fingers starting from the knuckles distally lasting few minutes to few hours with burning type pain. -She smokes half pack per day of cigarettes since 2007. -CBC in our office showed hemoglobin 20 and hematocrit of 63.  Serum erythropoietin was 12.7. -Carboxyhemoglobin was 4.2 (0.5-1.5).  Recent CT from 02/03/2019 showed normal upper abdominal organs. -JAK2 mutation testing is pending at this time. -She  is seen on an unscheduled visit with complaints of headache, chest pain/pressure over the lateral part of the chest lasting 20 minutes. -She does not have any history of heart problems.  Both parents had MI in their late 31s.  I have sent troponin, CPK and CK-MB. -It is possible that her symptoms are coming from elevated hemoglobin and hematocrit. -I have recommended a trial of phlebotomy.  We will give her 500 mL of normal saline after her phlebotomy today. -She felt better with improvement in headache after phlebotomy today.  She will follow up with me in 1 week to discuss results of JAK2 testing.      Orders placed this encounter:  Orders Placed This Encounter  Procedures  . CK total and CKMB (cardiac)not at [facility], [place] [phone]

## 2020-02-19 NOTE — Patient Instructions (Signed)
[facility] at [facility] Discharge Instructions  You were seen today by Dr. Ellin Saba. He went over how you're feeling. He will schedule you for a therapeutic phlebotomy. He will see you back as scheduled for follow up.   Thank you for choosing [facility] at [facility] to provide your oncology and hematology care.  To afford each patient quality time with our provider, please arrive at least 15 minutes before your scheduled appointment time.   If you have a lab appointment with the Cancer Center please come in thru the  Main Entrance and check in at the main information desk  You need to re-schedule your appointment should you arrive 10 or more minutes late.  We strive to give you quality time with our providers, and arriving late affects you and other patients whose appointments are after yours.  Also, if you no show three or more times for appointments you may be dismissed from the clinic at the providers discretion.     Again, thank you for choosing [facility].  Our hope is that these requests will decrease the amount of time that you wait before being seen by our physicians.       _____________________________________________________________  Should you have questions after your visit to [facility], please contact our office at [phone] between the hours of 8:00 a.m. and 4:30 p.m.  Voicemails left after 4:00 p.m. will not be returned until the following business day.  For prescription refill requests, have your pharmacy contact our office and allow 72 hours.    Cancer Center Support Programs:   > Cancer Support Group  2nd Tuesday of the month 1pm-2pm, Journey Room

## 2020-02-19 NOTE — Assessment & Plan Note (Addendum)
1.  Erythrocytosis: -She was evaluated for increased hemoglobin from CBC on 01/11/2020 showing 19/55.  Platelet count and white count was normal. -She reported symptoms of erythromelalgia with redness of her fingers starting from the knuckles distally lasting few minutes to few hours with burning type pain. -She smokes half pack per day of cigarettes since 2007. -CBC in our office showed hemoglobin 20 and hematocrit of 63.  Serum erythropoietin was 12.7. -Carboxyhemoglobin was 4.2 (0.5-1.5).  Recent CT from 02/03/2019 showed normal upper abdominal organs. -JAK2 mutation testing is pending at this time. -She is seen on an unscheduled visit with complaints of headache, chest pain/pressure over the lateral part of the chest lasting 20 minutes. -She does not have any history of heart problems.  Both parents had MI in their late 16s.  I have sent troponin, CPK and CK-MB. -It is possible that her symptoms are coming from elevated hemoglobin and hematocrit. -I have recommended a trial of phlebotomy.  We will give her 500 mL of normal saline after her phlebotomy today. -She felt better with improvement in headache after phlebotomy today.  She will follow up with me in 1 week to discuss results of JAK2 testing.

## 2020-02-19 NOTE — Progress Notes (Signed)
Lindsey Jensen presents today for phlebotomy per MD orders. Phlebotomy procedure started at 1432and ended at 1440 500 grams removed. Patient observed for 30 minutes after procedure without any incident. Patient tolerated procedure well. IV needle removed intact.  500 ml bolus of NS given per orders.   Vitals stable and discharged home from clinic ambulatory. Follow up as scheduled.

## 2020-02-21 ENCOUNTER — Ambulatory Visit (INDEPENDENT_AMBULATORY_CARE_PROVIDER_SITE_OTHER): Payer: BC Managed Care – PPO

## 2020-02-21 DIAGNOSIS — J309 Allergic rhinitis, unspecified: Secondary | ICD-10-CM | POA: Diagnosis not present

## 2020-02-21 LAB — JAK2 GENOTYPR

## 2020-02-26 ENCOUNTER — Ambulatory Visit (HOSPITAL_COMMUNITY): Payer: BC Managed Care – PPO | Admitting: Hematology

## 2020-02-26 ENCOUNTER — Inpatient Hospital Stay (HOSPITAL_BASED_OUTPATIENT_CLINIC_OR_DEPARTMENT_OTHER): Payer: BC Managed Care – PPO | Admitting: Hematology

## 2020-02-26 ENCOUNTER — Other Ambulatory Visit: Payer: Self-pay

## 2020-02-26 ENCOUNTER — Encounter (HOSPITAL_COMMUNITY): Payer: Self-pay | Admitting: Hematology

## 2020-02-26 DIAGNOSIS — D582 Other hemoglobinopathies: Secondary | ICD-10-CM

## 2020-02-26 DIAGNOSIS — D751 Secondary polycythemia: Secondary | ICD-10-CM | POA: Diagnosis not present

## 2020-02-26 DIAGNOSIS — N281 Cyst of kidney, acquired: Secondary | ICD-10-CM | POA: Diagnosis not present

## 2020-02-26 NOTE — Progress Notes (Signed)
Virtual Visit via Telephone Note  I connected with Lindsey Jensen on 02/26/20 at  4:20 PM EST by telephone and verified that I am speaking with the correct person using two identifiers.   I discussed the limitations, risks, security and privacy concerns of performing an evaluation and management service by telephone and the availability of in person appointments. I also discussed with the patient that there may be a patient responsible charge related to this service. The patient expressed understanding and agreed to proceed.   History of Present Illness: She is seen in the clinic for work-up of erythrocytosis.  She also has symptoms of erythromelalgia with redness of her fingers distal to the knuckles lasting few minutes to few hours with burning type of pain.  She smoked half pack of cigarettes per day since 2007.   Observations/Objective: She had phlebotomy done on 02/19/2020.  She felt fine on that day and felt tired the next day.  Her fingers are still getting red occasionally.  After phlebotomy, her headaches and pain in the calves have improved.  Denies any hematuria.  Has occasional nonspecific chest pains.  Assessment and Plan:  1.  JAK2 V6 1 7 F- erythrocytosis: -CBC on 02/19/2020 shows hemoglobin 20 and hematocrit 61.5. -He underwent phlebotomy on 02/19/2020. -Symptoms including headaches and pain in the calf has improved. -She still has erythromelalgias. -We talked about JAK2 V617F test which was negative.  Erythropoietin was 12.7. -She reports that she was smoking more than half pack a day few years ago but her blood was not this high at that time. -We would do other minor mutation testing including Jak 2 exon 12-15, MPL and a CALR. -We would also check imaging of her abdomen to evaluate for cysts/benign tumors in the kidneys/liver which can cause a secondary erythrocytosis. -She also reports nonspecific chest pains and palpitations.  We have checked cardiac enzymes at last visit which were  normal. -She had several EKGs which were consistent with biatrial enlargement. -We will check an echocardiogram. -We will also check a CBC to see the response to recent phlebotomy. -We will see her back in 2 to 3 weeks for follow-up.   Follow Up Instructions: RTC 2 to 3 weeks in follow-up.   I discussed the assessment and treatment plan with the patient. The patient was provided an opportunity to ask questions and all were answered. The patient agreed with the plan and demonstrated an understanding of the instructions.   The patient was advised to call back or seek an in-person evaluation if the symptoms worsen or if the condition fails to improve as anticipated.  I provided 16 minutes of non-face-to-face time during this encounter.   Doreatha Massed, MD

## 2020-02-28 ENCOUNTER — Ambulatory Visit (INDEPENDENT_AMBULATORY_CARE_PROVIDER_SITE_OTHER): Payer: BC Managed Care – PPO

## 2020-02-28 DIAGNOSIS — J309 Allergic rhinitis, unspecified: Secondary | ICD-10-CM

## 2020-03-05 ENCOUNTER — Ambulatory Visit (HOSPITAL_COMMUNITY)
Admission: RE | Admit: 2020-03-05 | Discharge: 2020-03-05 | Disposition: A | Discharge status: Home / Self Care | Payer: BC Managed Care – PPO | Source: Ambulatory Visit | Attending: Hematology | Admitting: Hematology

## 2020-03-05 ENCOUNTER — Inpatient Hospital Stay (HOSPITAL_COMMUNITY): Payer: BC Managed Care – PPO

## 2020-03-05 ENCOUNTER — Other Ambulatory Visit: Payer: Self-pay

## 2020-03-05 ENCOUNTER — Other Ambulatory Visit (HOSPITAL_COMMUNITY): Payer: BC Managed Care – PPO

## 2020-03-05 DIAGNOSIS — Z8249 Family history of ischemic heart disease and other diseases of the circulatory system: Secondary | ICD-10-CM | POA: Diagnosis not present

## 2020-03-05 DIAGNOSIS — H5789 Other specified disorders of eye and adnexa: Secondary | ICD-10-CM | POA: Diagnosis not present

## 2020-03-05 DIAGNOSIS — F17200 Nicotine dependence, unspecified, uncomplicated: Secondary | ICD-10-CM | POA: Diagnosis not present

## 2020-03-05 DIAGNOSIS — Z79899 Other long term (current) drug therapy: Secondary | ICD-10-CM | POA: Diagnosis not present

## 2020-03-05 DIAGNOSIS — R200 Anesthesia of skin: Secondary | ICD-10-CM | POA: Diagnosis not present

## 2020-03-05 DIAGNOSIS — J4540 Moderate persistent asthma, uncomplicated: Secondary | ICD-10-CM | POA: Diagnosis not present

## 2020-03-05 DIAGNOSIS — R519 Headache, unspecified: Secondary | ICD-10-CM | POA: Diagnosis not present

## 2020-03-05 DIAGNOSIS — D582 Other hemoglobinopathies: Secondary | ICD-10-CM

## 2020-03-05 DIAGNOSIS — R0602 Shortness of breath: Secondary | ICD-10-CM | POA: Diagnosis not present

## 2020-03-05 DIAGNOSIS — R0789 Other chest pain: Secondary | ICD-10-CM | POA: Diagnosis not present

## 2020-03-05 DIAGNOSIS — F17210 Nicotine dependence, cigarettes, uncomplicated: Secondary | ICD-10-CM | POA: Diagnosis not present

## 2020-03-05 DIAGNOSIS — M79646 Pain in unspecified finger(s): Secondary | ICD-10-CM | POA: Diagnosis not present

## 2020-03-05 DIAGNOSIS — R9431 Abnormal electrocardiogram [ECG] [EKG]: Secondary | ICD-10-CM

## 2020-03-05 DIAGNOSIS — I7381 Erythromelalgia: Secondary | ICD-10-CM | POA: Diagnosis not present

## 2020-03-05 DIAGNOSIS — R002 Palpitations: Secondary | ICD-10-CM | POA: Diagnosis not present

## 2020-03-05 DIAGNOSIS — D751 Secondary polycythemia: Secondary | ICD-10-CM

## 2020-03-05 DIAGNOSIS — R42 Dizziness and giddiness: Secondary | ICD-10-CM | POA: Diagnosis not present

## 2020-03-05 LAB — COMPREHENSIVE METABOLIC PANEL
ALT: 15 U/L (ref 0–44)
AST: 15 U/L (ref 15–41)
Albumin: 4.2 g/dL (ref 3.5–5.0)
Alkaline Phosphatase: 68 U/L (ref 38–126)
Anion gap: 9 (ref 5–15)
BUN: 10 mg/dL (ref 6–20)
CO2: 29 mmol/L (ref 22–32)
Calcium: 9.4 mg/dL (ref 8.9–10.3)
Chloride: 102 mmol/L (ref 98–111)
Creatinine, Ser: 0.53 mg/dL (ref 0.44–1.00)
GFR calc Af Amer: >60 mL/min (ref >60)
GFR calc non Af Amer: >60 mL/min (ref >60)
Glucose, Bld: 91 mg/dL (ref 70–99)
Potassium: 4.5 mmol/L (ref 3.5–5.1)
Sodium: 140 mmol/L (ref 135–145)
Total Bilirubin: 1.4 mg/dL — ABNORMAL HIGH (ref 0.3–1.2)
Total Protein: 7.4 g/dL (ref 6.5–8.1)

## 2020-03-05 LAB — CBC WITH DIFFERENTIAL/PLATELET
Abs Immature Granulocytes: 0.01 10*3/uL (ref 0.00–0.07)
Basophils Absolute: 0.1 10*3/uL (ref 0.0–0.1)
Basophils Relative: 1 %
Eosinophils Absolute: 0.3 10*3/uL (ref 0.0–0.5)
Eosinophils Relative: 4 %
HCT: 59.4 % — ABNORMAL HIGH (ref 36.0–46.0)
Hemoglobin: 18.9 g/dL — ABNORMAL HIGH (ref 12.0–15.0)
Immature Granulocytes: 0 %
Lymphocytes Relative: 28 %
Lymphs Abs: 1.8 10*3/uL (ref 0.7–4.0)
MCH: 30.6 pg (ref 26.0–34.0)
MCHC: 31.8 g/dL (ref 30.0–36.0)
MCV: 96.1 fL (ref 80.0–100.0)
Monocytes Absolute: 0.6 10*3/uL (ref 0.1–1.0)
Monocytes Relative: 9 %
Neutro Abs: 3.9 10*3/uL (ref 1.7–7.7)
Neutrophils Relative %: 58 %
Platelets: 171 10*3/uL (ref 150–400)
RBC: 6.18 MIL/uL — ABNORMAL HIGH (ref 3.87–5.11)
RDW: 16.2 % — ABNORMAL HIGH (ref 11.5–15.5)
WBC: 6.5 10*3/uL (ref 4.0–10.5)
nRBC: 0 % (ref 0.0–0.2)

## 2020-03-05 NOTE — Progress Notes (Signed)
*  PRELIMINARY RESULTS* Echocardiogram 2D Echocardiogram has been performed.  Stacey Drain 03/05/2020, 2:03 PM

## 2020-03-06 ENCOUNTER — Ambulatory Visit (INDEPENDENT_AMBULATORY_CARE_PROVIDER_SITE_OTHER): Payer: BC Managed Care – PPO

## 2020-03-06 DIAGNOSIS — J309 Allergic rhinitis, unspecified: Secondary | ICD-10-CM

## 2020-03-07 ENCOUNTER — Other Ambulatory Visit (HOSPITAL_COMMUNITY): Payer: Self-pay | Admitting: Nurse Practitioner

## 2020-03-11 LAB — CALRETICULIN (CALR) MUTATION ANALYSIS

## 2020-03-11 LAB — MPL MUTATION ANALYSIS

## 2020-03-12 ENCOUNTER — Ambulatory Visit (HOSPITAL_COMMUNITY): Admission: RE | Admit: 2020-03-12 | Payer: BC Managed Care – PPO | Source: Ambulatory Visit

## 2020-03-13 ENCOUNTER — Ambulatory Visit (INDEPENDENT_AMBULATORY_CARE_PROVIDER_SITE_OTHER): Payer: BC Managed Care – PPO

## 2020-03-13 DIAGNOSIS — J309 Allergic rhinitis, unspecified: Secondary | ICD-10-CM

## 2020-03-14 ENCOUNTER — Other Ambulatory Visit: Payer: Self-pay

## 2020-03-14 ENCOUNTER — Inpatient Hospital Stay (HOSPITAL_BASED_OUTPATIENT_CLINIC_OR_DEPARTMENT_OTHER): Payer: BC Managed Care – PPO | Admitting: Hematology

## 2020-03-14 VITALS — BP 132/92 | HR 95 | Temp 97.3°F | Resp 18 | Wt 166.0 lb

## 2020-03-14 DIAGNOSIS — F17200 Nicotine dependence, unspecified, uncomplicated: Secondary | ICD-10-CM | POA: Diagnosis not present

## 2020-03-14 DIAGNOSIS — D751 Secondary polycythemia: Secondary | ICD-10-CM

## 2020-03-14 DIAGNOSIS — Z79899 Other long term (current) drug therapy: Secondary | ICD-10-CM | POA: Diagnosis not present

## 2020-03-14 DIAGNOSIS — Z8249 Family history of ischemic heart disease and other diseases of the circulatory system: Secondary | ICD-10-CM | POA: Diagnosis not present

## 2020-03-14 DIAGNOSIS — F17210 Nicotine dependence, cigarettes, uncomplicated: Secondary | ICD-10-CM | POA: Diagnosis not present

## 2020-03-14 DIAGNOSIS — R200 Anesthesia of skin: Secondary | ICD-10-CM | POA: Diagnosis not present

## 2020-03-14 DIAGNOSIS — H5789 Other specified disorders of eye and adnexa: Secondary | ICD-10-CM | POA: Diagnosis not present

## 2020-03-14 DIAGNOSIS — R0602 Shortness of breath: Secondary | ICD-10-CM | POA: Diagnosis not present

## 2020-03-14 DIAGNOSIS — N281 Cyst of kidney, acquired: Secondary | ICD-10-CM

## 2020-03-14 DIAGNOSIS — R0789 Other chest pain: Secondary | ICD-10-CM | POA: Diagnosis not present

## 2020-03-14 DIAGNOSIS — R519 Headache, unspecified: Secondary | ICD-10-CM | POA: Diagnosis not present

## 2020-03-14 DIAGNOSIS — R42 Dizziness and giddiness: Secondary | ICD-10-CM | POA: Diagnosis not present

## 2020-03-14 DIAGNOSIS — M79646 Pain in unspecified finger(s): Secondary | ICD-10-CM | POA: Diagnosis not present

## 2020-03-14 DIAGNOSIS — R002 Palpitations: Secondary | ICD-10-CM | POA: Diagnosis not present

## 2020-03-14 DIAGNOSIS — J4540 Moderate persistent asthma, uncomplicated: Secondary | ICD-10-CM | POA: Diagnosis not present

## 2020-03-14 DIAGNOSIS — I7381 Erythromelalgia: Secondary | ICD-10-CM | POA: Diagnosis not present

## 2020-03-15 NOTE — Assessment & Plan Note (Signed)
1.  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  Carboxyhemoglobin 4.2 (0.5-1.5) -Positive erythromelalgia. -Erythropoietin 12.7. -Phlebotomy on 02/19/2020.  She felt better after that with improvement in erythromelalgia.  However she started to have the symptoms back in the past few days. -We reviewed results from 03/05/2020, hemoglobin was 18.9, hematocrit was 59.4. -CALR and MPL mutation were negative. -I reviewed results of her echocardiogram from 03/05/2020.  EF is 60-65% with normal function.  Atria are normal. -We have ordered CT scan of her abdomen to look for cysts and benign tumors in the kidneys and liver.  However her insurance declined it.  Even if it is approved by her insurance, her co-pay part is still high.  Hence we will order ultrasound of the abdomen. -Based on her symptoms, I have recommended phlebotomy every 7 to 10 days x 2. -We will reevaluate her in 1 month with repeat CBC.

## 2020-03-15 NOTE — Progress Notes (Signed)
[facility] [address], [place] [address]   CLINIC:  Medical Oncology/Hematology  PCP:  Elfredia Nevins, MD [address] [place] [address] [phone]   REASON FOR VISIT:  Follow-up for erythrocytosis.  CURRENT THERAPY: Phlebotomy.   INTERVAL HISTORY:  Lindsey Jensen 34 y.o. female seen for follow-up of erythrocytosis.  She felt better for few days after last phlebotomy.  However her finger started turning red again with burning sensation.  Denies any headaches or vision changes.  Chronic numbness in the feet has been stable.  No abdominal pains.  No hematuria.    REVIEW OF SYSTEMS:  Review of Systems  Neurological: Positive for numbness.  All other systems reviewed and are negative.    PAST MEDICAL/SURGICAL HISTORY:  Past Medical History:  Diagnosis Date   Contraceptive education 01/26/2014   Medical history non-contributory    Moderate persistent asthma, uncomplicated 05/19/2019   Past Surgical History:  Procedure Laterality Date   NO PAST SURGERIES       SOCIAL HISTORY:  Social History   Socioeconomic History   Marital status: Married    Spouse name: Not on file   Number of children: 1   Years of education: Not on file   Highest education level: Not on file  Occupational History    Employer: Airline pilot  Tobacco Use   Smoking status: Current Every Day Smoker    Packs/day: 0.50    Years: 14.00    Pack years: 7.00    Types: Cigarettes   Smokeless tobacco: Never Used  Substance and Sexual Activity   Alcohol use: Yes    Alcohol/week: 14.0 standard drinks    Types: 14 Glasses of wine per week    Comment: glass of wine every night     Drug use: No   Sexual activity: Yes    Birth control/protection: None  Other Topics Concern   Not on file  Social History Narrative   Not on file   Social Determinants of Health   Financial Resource Strain:    Difficulty of Paying Living  Expenses:   Food Insecurity:    Worried About Programme researcher, broadcasting/film/video in the Last Year:    Barista in the Last Year:   Transportation Needs:    Freight forwarder (Medical):    Lack of Transportation (Non-Medical):   Physical Activity:    Days of Exercise per Week:    Minutes of Exercise per Session:   Stress:    Feeling of Stress :   Social Connections:    Frequency of Communication with Friends and Family:    Frequency of Social Gatherings with Friends and Family:    Attends Religious Services:    Active Member of Clubs or Organizations:    Attends Engineer, structural:    Marital Status:   Intimate Partner Violence:    Fear of Current or Ex-Partner:    Emotionally Abused:    Physically Abused:    Sexually Abused:     FAMILY HISTORY:  Family History  Problem Relation Age of Onset   Heart disease Father        heart attack   Allergic rhinitis Father    Diabetes Mother    Depression Mother    Anxiety disorder Mother    Heart disease Mother    Heart disease Maternal Grandmother        CHF   Asthma Maternal Grandmother    Diabetes Maternal  Grandmother    High Cholesterol Sister    Kidney Stones Brother    Alzheimer's disease Maternal Grandfather    Asthma Maternal Grandfather    Diabetes Maternal Uncle    Angioedema Neg Hx    Immunodeficiency Neg Hx     CURRENT MEDICATIONS:  Outpatient Encounter Medications as of 03/14/2020  Medication Sig   Vitamin D, Ergocalciferol, (DRISDOL) 1.25 MG (50000 UNIT) CAPS capsule 1 CAPS ONCE WEEKLY. ONCE THIS PRESCRIPTION IS DONE THEN PT IS TO TAKE 2000 UNITS OTC DAILY.   beclomethasone (QVAR REDIHALER) 80 MCG/ACT inhaler Inhale 2 puffs into the lungs 2 (two) times daily. (Patient not taking: Reported on 02/19/2020)   EPINEPHrine (EPIPEN 2-PAK) 0.3 mg/0.3 mL IJ SOAJ injection Use as directed for severe allergic reaction (Patient not taking: Reported on 02/19/2020)   levalbuterol  (XOPENEX HFA) 45 MCG/ACT inhaler Inhale 1-2 puffs into the lungs every 4 (four) hours as needed for wheezing.   No facility-administered encounter medications on file as of 03/14/2020.    ALLERGIES:  Allergies  Allergen Reactions   Beef-Derived Products Anaphylaxis    Alpha-Gal Allergy    Dairy Aid [Lactase] Other (See Comments)    SOB   Meat [Alpha-Gal] Palpitations    All mammal. Symptom asthma .   Pork-Derived Products Anaphylaxis    Alpha-Gal Allergy   Shellfish Allergy Other (See Comments)    SOB     PHYSICAL EXAM:  ECOG Performance status: 0  Vitals:   03/14/20 1551  BP: (!) 132/92  Pulse: 95  Resp: 18  Temp: (!) 97.3 F (36.3 C)  SpO2: 100%   Filed Weights   03/14/20 1551  Weight: 166 lb (75.3 kg)    Physical Exam Vitals reviewed.  Constitutional:      Appearance: Normal appearance.  Cardiovascular:     Rate and Rhythm: Normal rate and regular rhythm.     Heart sounds: Normal heart sounds.  Pulmonary:     Effort: Pulmonary effort is normal.     Breath sounds: Normal breath sounds.  Abdominal:     Palpations: Abdomen is soft.  Skin:    General: Skin is warm.  Neurological:     General: No focal deficit present.     Mental Status: She is alert and oriented to person, place, and time.  Psychiatric:        Mood and Affect: Mood normal.        Behavior: Behavior normal.      LABORATORY DATA:  I have reviewed the labs as listed.  CBC    Component Value Date/Time   WBC 6.5 03/05/2020 1403   RBC 6.18 (H) 03/05/2020 1403   HGB 18.9 (H) 03/05/2020 1403   HGB 12.3 03/25/2018 0907   HCT 59.4 (H) 03/05/2020 1403   HCT 35.9 03/25/2018 0907   PLT 171 03/05/2020 1403   PLT 185 03/25/2018 0907   MCV 96.1 03/05/2020 1403   MCV 92 03/25/2018 0907   MCH 30.6 03/05/2020 1403   MCHC 31.8 03/05/2020 1403   RDW 16.2 (H) 03/05/2020 1403   RDW 12.1 (L) 03/25/2018 0907   LYMPHSABS 1.8 03/05/2020 1403   MONOABS 0.6 03/05/2020 1403   EOSABS 0.3  03/05/2020 1403   BASOSABS 0.1 03/05/2020 1403   CMP Latest Ref Rng & Units 03/05/2020 02/19/2020 01/25/2020  Glucose 70 - 99 mg/dL 91 314(H) 82  BUN 6 - 20 mg/dL 10 10 13   Creatinine 0.44 - 1.00 mg/dL 7.02 6.37  Sodium 135 - 145 mmol/L  140 138 137  Potassium 3.5 - 5.1 mmol/L 4.5 4.7 3.8  Chloride 98 - 111 mmol/L 102 101 102  CO2 22 - 32 mmol/L 29 28 25   Calcium 8.9 - 10.3 mg/dL 9.4 9.6 8.9  Total Protein 6.5 - 8.1 g/dL 7.4 7.3 7.2  Total Bilirubin 0.3 - 1.2 mg/dL 1.4(H) 1.5(H) 1.1  Alkaline Phos 38 - 126 U/L 68 71 76  AST 15 - 41 U/L 15 15 16   ALT 0 - 44 U/L 15 16 16        DIAGNOSTIC IMAGING:  I have reviewed her scans.   ASSESSMENT & PLAN:   Erythrocytosis 1.  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  Carboxyhemoglobin 4.2 (0.5-1.5) -Positive erythromelalgia. -Erythropoietin 12.7. -Phlebotomy on 02/19/2020.  She felt better after that with improvement in erythromelalgia.  However she started to have the symptoms back in the past few days. -We reviewed results from 03/05/2020, hemoglobin was 18.9, hematocrit was 59.4. -CALR and MPL mutation were negative. -I reviewed results of her echocardiogram from 03/05/2020.  EF is 60-65% with normal function.  Atria are normal. -We have ordered CT scan of her abdomen to look for cysts and benign tumors in the kidneys and liver.  However her insurance declined it.  Even if it is approved by her insurance, her co-pay part is still high.  Hence we will order ultrasound of the abdomen. -Based on her symptoms, I have recommended phlebotomy every 7 to 10 days x 2. -We will reevaluate her in 1 month with repeat CBC.      Orders placed this encounter:  Orders Placed This Encounter  Procedures   US Abdomen Complete      Derek Jack, MD [place] [phone]

## 2020-03-18 ENCOUNTER — Encounter (HOSPITAL_COMMUNITY): Payer: BC Managed Care – PPO

## 2020-03-20 ENCOUNTER — Ambulatory Visit (INDEPENDENT_AMBULATORY_CARE_PROVIDER_SITE_OTHER): Payer: BC Managed Care – PPO

## 2020-03-20 DIAGNOSIS — J309 Allergic rhinitis, unspecified: Secondary | ICD-10-CM | POA: Diagnosis not present

## 2020-03-25 ENCOUNTER — Encounter: Payer: Self-pay | Admitting: Allergy & Immunology

## 2020-03-25 ENCOUNTER — Encounter (HOSPITAL_COMMUNITY): Payer: Self-pay

## 2020-03-25 ENCOUNTER — Other Ambulatory Visit: Payer: Self-pay

## 2020-03-25 MED ORDER — FLUTICASONE PROPIONATE 50 MCG/ACT NA SUSP: 2.0000 | Freq: Every day | NASAL | 5 refills | Status: DC

## 2020-03-26 ENCOUNTER — Ambulatory Visit: Payer: BC Managed Care – PPO | Admitting: Family Medicine

## 2020-03-27 ENCOUNTER — Encounter: Payer: Self-pay | Admitting: Allergy & Immunology

## 2020-03-28 ENCOUNTER — Other Ambulatory Visit: Payer: Self-pay | Admitting: *Deleted

## 2020-03-29 ENCOUNTER — Other Ambulatory Visit: Payer: Self-pay | Admitting: *Deleted

## 2020-03-29 MED ORDER — AMOXICILLIN-POT CLAVULANATE 875-125 MG PO TABS: 1.0000 | ORAL_TABLET | Freq: Two times a day (BID) | ORAL | 0 refills | Status: AC

## 2020-04-03 ENCOUNTER — Ambulatory Visit (INDEPENDENT_AMBULATORY_CARE_PROVIDER_SITE_OTHER): Payer: BC Managed Care – PPO

## 2020-04-03 DIAGNOSIS — J309 Allergic rhinitis, unspecified: Secondary | ICD-10-CM

## 2020-04-05 ENCOUNTER — Other Ambulatory Visit: Payer: Self-pay

## 2020-04-05 ENCOUNTER — Encounter (HOSPITAL_COMMUNITY): Payer: BC Managed Care – PPO

## 2020-04-05 ENCOUNTER — Ambulatory Visit (HOSPITAL_COMMUNITY)
Admission: RE | Admit: 2020-04-05 | Discharge: 2020-04-05 | Disposition: A | Discharge status: Home / Self Care | Payer: BC Managed Care – PPO | Source: Ambulatory Visit | Attending: Hematology | Admitting: Hematology

## 2020-04-05 DIAGNOSIS — N281 Cyst of kidney, acquired: Secondary | ICD-10-CM | POA: Insufficient documentation

## 2020-04-09 ENCOUNTER — Other Ambulatory Visit: Payer: Self-pay

## 2020-04-09 ENCOUNTER — Inpatient Hospital Stay (HOSPITAL_COMMUNITY): Payer: BC Managed Care – PPO | Attending: Hematology | Admitting: Hematology

## 2020-04-09 DIAGNOSIS — D751 Secondary polycythemia: Secondary | ICD-10-CM | POA: Diagnosis not present

## 2020-04-09 DIAGNOSIS — D582 Other hemoglobinopathies: Secondary | ICD-10-CM | POA: Diagnosis not present

## 2020-04-09 MED ORDER — NICOTINE 21 MG/24HR TD PT24: 21.0000 mg | MEDICATED_PATCH | Freq: Every day | TRANSDERMAL | 0 refills | Status: DC

## 2020-04-09 NOTE — Progress Notes (Signed)
Virtual Visit via Telephone Note  I connected with Lindsey Jensen on 04/09/20 at  3:50 PM EDT by telephone and verified that I am speaking with the correct person using two identifiers.   I discussed the limitations, risks, security and privacy concerns of performing an evaluation and management service by telephone and the availability of in person appointments. I also discussed with the patient that there may be a patient responsible charge related to this service. The patient expressed understanding and agreed to proceed.   History of Present Illness: She was seen for follow-up of JAK2 V6 1 7 F- erythrocytosis.  She had erythromelalgia's.  She is 1/2 pack/day cigarette smoker since 2007.  She had phlebotomy on 02/19/2020 with improvement in symptoms of erythromelalgia.   Observations/Objective: She was not able to do phlebotomy as we discussed on 03/15/2020 as she was busy with her husband's care who underwent open heart surgery last week.  However she denies any resurgence of her symptoms.  She is currently smoking three fourths of a pack of cigarettes per day.  She is also caring for her 59-year-old son.  Denies any headaches or vision changes.  Assessment and Plan:  1.  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  Serum erythropoietin is 12.7. -Ultrasound of the abdomen on 04/05/2020 did not show any cystic lesions in the liver or kidneys.  No abnormality seen. -We will check a CBC next week.  She would like to donate blood if her hematocrit is elevated. -She is planning to quit smoking soon.  Her husband just had open heart surgery and quit smoking 2 weeks ago.  I have given him prescription for nicotine patch 21 mg for 6 weeks. -We will plan to repeat his CBC in 3 months.  Ideally this would be 4 to 6 weeks after she quit smoking.  If her CBC shows that hemoglobin and hematocrit does not normalize, will consider bone marrow  biopsy.   Follow Up Instructions: RTC 3 months with labs.   I discussed the assessment and treatment plan with the patient. The patient was provided an opportunity to ask questions and all were answered. The patient agreed with the plan and demonstrated an understanding of the instructions.   The patient was advised to call back or seek an in-person evaluation if the symptoms worsen or if the condition fails to improve as anticipated.  I provided 11 minutes of non-face-to-face time during this encounter.   Doreatha Massed, MD

## 2020-04-10 ENCOUNTER — Ambulatory Visit: Payer: Self-pay

## 2020-04-10 ENCOUNTER — Ambulatory Visit (INDEPENDENT_AMBULATORY_CARE_PROVIDER_SITE_OTHER): Payer: BC Managed Care – PPO | Admitting: Allergy & Immunology

## 2020-04-10 ENCOUNTER — Encounter: Payer: Self-pay | Admitting: Allergy & Immunology

## 2020-04-10 VITALS — BP 126/82 | HR 96 | Temp 98.0°F | Resp 18 | Ht 69.0 in | Wt 166.2 lb

## 2020-04-10 DIAGNOSIS — J309 Allergic rhinitis, unspecified: Secondary | ICD-10-CM

## 2020-04-10 DIAGNOSIS — Z720 Tobacco use: Secondary | ICD-10-CM

## 2020-04-10 DIAGNOSIS — T782XXD Anaphylactic shock, unspecified, subsequent encounter: Secondary | ICD-10-CM | POA: Diagnosis not present

## 2020-04-10 DIAGNOSIS — J4540 Moderate persistent asthma, uncomplicated: Secondary | ICD-10-CM | POA: Diagnosis not present

## 2020-04-10 MED ORDER — AZELASTINE HCL 0.1 % NA SOLN: NASAL | 5 refills | Status: DC

## 2020-04-10 NOTE — Patient Instructions (Addendum)
1. Perennial and seasonal allergic rhinitis (dust mites, cat, dog, roach, trees, ragweed) - Continue with allergy shots at the same schedule (Schedule A).  - Continue Allegra 180mg  once a day for a runny nose or itch - Continue Flonase 2 sprays in each nostril once a day for a stuffy nose - Refill Astelin two sprays per nostril up to twice daily on the worst days.  - Continue saline nasal rinses, to be used before fluticasone.   2. ? asthma  - looks phenomenal. - I think if anything you had allergic asthma.  - Daily controller medication(s): NONE - Prior to physical activity: albuterol 2 puffs 10-15 minutes before physical activity. - Rescue medications: albuterol 4 puffs every 4-6 hours as needed - Asthma control goals:  * Full participation in all desired activities (may need albuterol before activity) * Albuterol use two time or less a week on average (not counting use with activity) * Cough interfering with sleep two time or less a month * Oral steroids no more than once a year * No hospitalizations  3. Multiple food allergies - Continue to avoid all of your triggering foods. - We will retest in the fall to see where allergy levels are hanging out.  4. Return in about 6 months (around 10/10/2020). This can be an in-person, a virtual Webex or a telephone follow up visit.   Please inform 10/12/2020 of any Emergency Department visits, hospitalizations, or changes in symptoms. Call us before going to the ED for breathing or allergy symptoms since we might be able to fit you in for a sick visit. Feel free to contact us anytime with any questions, problems, or concerns.  It was a pleasure to see you again today!  Websites that have reliable patient information: 1. American Academy of Asthma, Allergy, and Immunology: www.aaaai.org 2. Food Allergy Research and Education (FARE): foodallergy.org 3. Mothers of Asthmatics: http://www.asthmacommunitynetwork.org 4. American College of Allergy,  Asthma, and Immunology: www.acaai.org   COVID-19 Vaccine Information can be found at: [place] For questions related to vaccine distribution or appointments, please email [email or web address] or call [phone].     "Like" [phone] on Facebook and Instagram for our latest updates!       HAPPY SPRING!  Make sure you are registered to vote! If you have moved or changed any of your contact information, you will need to get this updated before voting!  In some cases, you MAY be able to register to vote online: [place]

## 2020-04-10 NOTE — Progress Notes (Signed)
FOLLOW UP  Date of Service/Encounter:  04/10/20   Assessment:   Seasonal and perennial allergic rhinitis(dust mites, cat, dog, roach, trees, and ragweed)  Intermittent episodes of throat irritation for days following her allergy injections (despite antihistamines and prednisone)  Moderate persistent asthma, uncomplicated   Plan/Recommendations:   1. Perennial and seasonal allergic rhinitis (dust mites, cat, dog, roach, trees, ragweed) - Continue with allergy shots at the same schedule (Schedule A).  - Continue Allegra 180mg  once a day for a runny nose or itch - Continue Flonase 2 sprays in each nostril once a day for a stuffy nose - Refill Astelin two sprays per nostril up to twice daily on the worst days.  - Continue saline nasal rinses, to be used before fluticasone.   2. ? asthma  - Arlyce Harman looks phenomenal. - I think if anything you had allergic asthma.  - Daily controller medication(s): NONE - Prior to physical activity: albuterol 2 puffs 10-15 minutes before physical activity. - Rescue medications: albuterol 4 puffs every 4-6 hours as needed - Asthma control goals:  * Full participation in all desired activities (may need albuterol before activity) * Albuterol use two time or less a week on average (not counting use with activity) * Cough interfering with sleep two time or less a month * Oral steroids no more than once a year * No hospitalizations  3. Multiple food allergies - Continue to avoid all of your triggering foods. - We will retest in the fall to see where allergy levels are hanging out.  4. Return in about 6 months (around 10/10/2020). This can be an in-person, a virtual Webex or a telephone follow up visit.   Subjective:   Lindsey Jensen is a 34 y.o. female presenting today for follow up of  Chief Complaint  Patient presents with  . No complaints    Denies any serious issues besides the pollen being "a little rough"    Lindsey Jensen has a history of  the following: Patient Active Problem List   Diagnosis Date Noted  . Erythrocytosis 01/25/2020  . 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 Cough 06/17/2019  . Oral candida 06/17/2019  . Seasonal and perennial allergic rhinitis 05/19/2019  . Moderate persistent asthma, uncomplicated 76/28/3151  . Tobacco use 10/07/2017  . Generalized anxiety disorder 08/23/2013    History obtained from: chart review and patient.  Lindsey Jensen is a 34 y.o. female presenting for a follow up visit.  He was last seen in October 2020.  At that time, we continued with her allergy shots schedule A.  We continued Allegra as well as Flonase.  She had a history of possible asthma.  We did not put her on a controller medication.  We continued with albuterol 2 puffs every 4-6 hours as needed.  She had been on both Qvar and Symbicort in the past with difficult to evaluate control.   In the interim, earlier this month, we did send in Augmentin.  However, she apparently never picked it up because of chaos in her life and she started feeling better.  It still at the pharmacy. She had what she thought was a sinus infection. She felt terrible but her husband had surgery and her mother in law came into town. This was a semi emergent surgery. Her husband recently got open heart surgery.   Asthma/Respiratory Symptom History: Asthma has bene well controlled. She is using her Qvar and is rarely using her rescue inhaler. the shots are helping a  lot. Even when she feels like she cannot breathe, her peak flow is high and her pulse ox is normal. She will take her Qvar for a few weeks at a time. She has plenty at the house.   Allergic Rhinitis Symptom History: She did have some large local reactions with her trees/cats/dogs. She is getting large local reactions with intense pruritis. They are all delayed around two hours after the injection. She is no longer using the prednisone prior to the injection. She has Flonase.   Food Allergy Symptom History:  At  the last visit, we did do food testing via the blood.  She was mildly positive to a number of foods including crab, shrimp, pistachio, walnut, peanut, hazelnut, almond, beef, pork, and milk.  Nothing was super high, but we did recommend red meat avoidance.  She went to get her Moderna vaccination. Within five minutes, she started having throat swelling. She was not nervous at all. She was told to relax. She is going in two weeks to get her second vaccination. She has never had issues with other vaccinations. She had no hives or other issues. She thinks she was just nervous.     She has not touched dairy or red meat. She was never a fan of tree nuts.   Otherwise, there have been no changes to her past medical history, surgical history, family history, or social history.    Review of Systems  Constitutional: Negative.  Negative for fever, malaise/fatigue and weight loss.  HENT: Negative.  Negative for congestion, ear discharge and ear pain.   Eyes: Negative for pain, discharge and redness.  Respiratory: Negative for cough, sputum production, shortness of breath and wheezing.   Cardiovascular: Negative.  Negative for chest pain and palpitations.  Gastrointestinal: Negative for abdominal pain, constipation, diarrhea, heartburn, nausea and vomiting.  Skin: Negative.  Negative for itching and rash.  Neurological: Negative for dizziness and headaches.  Endo/Heme/Allergies: Negative for environmental allergies. Does not bruise/bleed easily.       Objective:   Blood pressure 126/82, pulse 96, temperature 98 F (36.7 C), temperature source Temporal, resp. rate 18, height 5\' 9"  (1.753 m), weight 166 lb 3.2 oz (75.4 kg), SpO2 96 %, not currently breastfeeding. Body mass index is 24.54 kg/m.   Physical Exam:  Physical Exam  Constitutional: She appears well-developed.  HENT:  Head: Normocephalic and atraumatic.  Right Ear: Tympanic membrane, external ear and ear canal normal.  Left Ear:  Tympanic membrane and ear canal normal.  Nose: No mucosal edema, rhinorrhea, nasal deformity or septal deviation. No epistaxis. Right sinus exhibits no maxillary sinus tenderness and no frontal sinus tenderness. Left sinus exhibits no maxillary sinus tenderness and no frontal sinus tenderness.  Mouth/Throat: Uvula is midline and oropharynx is clear and moist. Mucous membranes are not pale and not dry.  Eyes: Pupils are equal, round, and reactive to light. Conjunctivae and EOM are normal. Right eye exhibits no chemosis and no discharge. Left eye exhibits no chemosis and no discharge. Right conjunctiva is not injected. Left conjunctiva is not injected.  Cardiovascular: Normal rate, regular rhythm and normal heart sounds.  Respiratory: Effort normal and breath sounds normal. No accessory muscle usage. No tachypnea. No respiratory distress. She has no wheezes. She has no rhonchi. She has no rales. She exhibits no tenderness.  Lymphadenopathy:    She has no cervical adenopathy.  Neurological: She is alert.  Skin: No abrasion, no petechiae and no rash noted. Rash is not papular, not vesicular and  not urticarial. No erythema. No pallor.  Psychiatric: She has a normal mood and affect.     Diagnostic studies:    Spirometry: results normal (FEV1: 3.55/97%, FVC: 3.89/89%, FEV1/FVC: 91%).    Spirometry consistent with normal pattern.   Allergy Studies: none      Malachi Bonds, MD  [facility]

## 2020-04-16 ENCOUNTER — Inpatient Hospital Stay (HOSPITAL_COMMUNITY): Payer: BC Managed Care – PPO

## 2020-04-16 ENCOUNTER — Other Ambulatory Visit: Payer: Self-pay

## 2020-04-16 DIAGNOSIS — D751 Secondary polycythemia: Secondary | ICD-10-CM | POA: Diagnosis not present

## 2020-04-16 LAB — CBC WITH DIFFERENTIAL/PLATELET
Abs Immature Granulocytes: 0.01 10*3/uL (ref 0.00–0.07)
Basophils Absolute: 0.1 10*3/uL (ref 0.0–0.1)
Basophils Relative: 1 %
Eosinophils Absolute: 0.1 10*3/uL (ref 0.0–0.5)
Eosinophils Relative: 2 %
HCT: 56.2 % — ABNORMAL HIGH (ref 36.0–46.0)
Hemoglobin: 18 g/dL — ABNORMAL HIGH (ref 12.0–15.0)
Immature Granulocytes: 0 %
Lymphocytes Relative: 28 %
Lymphs Abs: 1.5 10*3/uL (ref 0.7–4.0)
MCH: 31.5 pg (ref 26.0–34.0)
MCHC: 32 g/dL (ref 30.0–36.0)
MCV: 98.4 fL (ref 80.0–100.0)
Monocytes Absolute: 0.5 10*3/uL (ref 0.1–1.0)
Monocytes Relative: 10 %
Neutro Abs: 3.2 10*3/uL (ref 1.7–7.7)
Neutrophils Relative %: 59 %
Platelets: 148 10*3/uL — ABNORMAL LOW (ref 150–400)
RBC: 5.71 MIL/uL — ABNORMAL HIGH (ref 3.87–5.11)
RDW: 16.7 % — ABNORMAL HIGH (ref 11.5–15.5)
WBC: 5.4 10*3/uL (ref 4.0–10.5)
nRBC: 0 % (ref 0.0–0.2)

## 2020-04-17 ENCOUNTER — Ambulatory Visit (INDEPENDENT_AMBULATORY_CARE_PROVIDER_SITE_OTHER): Payer: BC Managed Care – PPO

## 2020-04-17 DIAGNOSIS — J309 Allergic rhinitis, unspecified: Secondary | ICD-10-CM | POA: Diagnosis not present

## 2020-04-19 ENCOUNTER — Ambulatory Visit (INDEPENDENT_AMBULATORY_CARE_PROVIDER_SITE_OTHER): Payer: BC Managed Care – PPO

## 2020-04-19 DIAGNOSIS — J309 Allergic rhinitis, unspecified: Secondary | ICD-10-CM

## 2020-04-23 ENCOUNTER — Other Ambulatory Visit: Payer: Self-pay | Admitting: *Deleted

## 2020-04-23 ENCOUNTER — Encounter: Payer: Self-pay | Admitting: Allergy & Immunology

## 2020-04-23 MED ORDER — PREDNISONE 10 MG PO TABS: 10.0000 mg | ORAL_TABLET | Freq: Every day | ORAL | 0 refills | Status: DC

## 2020-05-01 ENCOUNTER — Ambulatory Visit (INDEPENDENT_AMBULATORY_CARE_PROVIDER_SITE_OTHER): Payer: BC Managed Care – PPO

## 2020-05-01 DIAGNOSIS — J309 Allergic rhinitis, unspecified: Secondary | ICD-10-CM

## 2020-05-08 ENCOUNTER — Ambulatory Visit (INDEPENDENT_AMBULATORY_CARE_PROVIDER_SITE_OTHER): Payer: BC Managed Care – PPO

## 2020-05-08 DIAGNOSIS — J309 Allergic rhinitis, unspecified: Secondary | ICD-10-CM

## 2020-05-16 ENCOUNTER — Encounter (HOSPITAL_COMMUNITY): Payer: Self-pay | Admitting: *Deleted

## 2020-05-16 NOTE — Progress Notes (Signed)
Patient called clinic wanting to know if she could give blood.  She states that she wants to give double RBC.  Per Mathis Bud, NP, patient has no showed for her last 2 phlebotomies.  She states that it is okay for patient to donate blood.    I attempted to call patient back to notify and had to leave a VM.  I asked that she return our call should she have any questions and for her to keep her appt in July as scheduled.

## 2020-05-17 ENCOUNTER — Ambulatory Visit (INDEPENDENT_AMBULATORY_CARE_PROVIDER_SITE_OTHER): Payer: BC Managed Care – PPO

## 2020-05-17 DIAGNOSIS — J309 Allergic rhinitis, unspecified: Secondary | ICD-10-CM

## 2020-05-18 ENCOUNTER — Encounter (HOSPITAL_COMMUNITY): Payer: Self-pay | Admitting: *Deleted

## 2020-05-18 ENCOUNTER — Emergency Department (HOSPITAL_COMMUNITY)
Admission: EM | Admit: 2020-05-18 | Discharge: 2020-05-18 | Disposition: A | Discharge status: Home / Self Care | Payer: BC Managed Care – PPO | Attending: Emergency Medicine | Admitting: Emergency Medicine

## 2020-05-18 ENCOUNTER — Other Ambulatory Visit: Payer: Self-pay

## 2020-05-18 DIAGNOSIS — F17210 Nicotine dependence, cigarettes, uncomplicated: Secondary | ICD-10-CM | POA: Diagnosis not present

## 2020-05-18 DIAGNOSIS — R519 Headache, unspecified: Secondary | ICD-10-CM | POA: Diagnosis not present

## 2020-05-18 DIAGNOSIS — Z79899 Other long term (current) drug therapy: Secondary | ICD-10-CM | POA: Diagnosis not present

## 2020-05-18 LAB — COMPREHENSIVE METABOLIC PANEL
ALT: 17 U/L (ref 0–44)
AST: 15 U/L (ref 15–41)
Albumin: 3.7 g/dL (ref 3.5–5.0)
Alkaline Phosphatase: 73 U/L (ref 38–126)
Anion gap: 8 (ref 5–15)
BUN: 14 mg/dL (ref 6–20)
CO2: 27 mmol/L (ref 22–32)
Calcium: 8.9 mg/dL (ref 8.9–10.3)
Chloride: 101 mmol/L (ref 98–111)
Creatinine, Ser: 0.87 mg/dL (ref 0.44–1.00)
GFR calc Af Amer: >60 mL/min (ref >60)
GFR calc non Af Amer: >60 mL/min (ref >60)
Glucose, Bld: 98 mg/dL (ref 70–99)
Potassium: 3.7 mmol/L (ref 3.5–5.1)
Sodium: 136 mmol/L (ref 135–145)
Total Bilirubin: 0.9 mg/dL (ref 0.3–1.2)
Total Protein: 6.7 g/dL (ref 6.5–8.1)

## 2020-05-18 LAB — CBC WITH DIFFERENTIAL/PLATELET
Abs Immature Granulocytes: 0.02 10*3/uL (ref 0.00–0.07)
Basophils Absolute: 0 10*3/uL (ref 0.0–0.1)
Basophils Relative: 1 %
Eosinophils Absolute: 0.1 10*3/uL (ref 0.0–0.5)
Eosinophils Relative: 1 %
HCT: 58.5 % — ABNORMAL HIGH (ref 36.0–46.0)
Hemoglobin: 18.7 g/dL — ABNORMAL HIGH (ref 12.0–15.0)
Immature Granulocytes: 0 %
Lymphocytes Relative: 23 %
Lymphs Abs: 1.6 10*3/uL (ref 0.7–4.0)
MCH: 31.1 pg (ref 26.0–34.0)
MCHC: 32 g/dL (ref 30.0–36.0)
MCV: 97.2 fL (ref 80.0–100.0)
Monocytes Absolute: 0.6 10*3/uL (ref 0.1–1.0)
Monocytes Relative: 9 %
Neutro Abs: 4.5 10*3/uL (ref 1.7–7.7)
Neutrophils Relative %: 66 %
Platelets: 173 10*3/uL (ref 150–400)
RBC: 6.02 MIL/uL — ABNORMAL HIGH (ref 3.87–5.11)
RDW: 17.3 % — ABNORMAL HIGH (ref 11.5–15.5)
WBC: 6.8 10*3/uL (ref 4.0–10.5)
nRBC: 0 % (ref 0.0–0.2)

## 2020-05-18 LAB — PREGNANCY, URINE: Preg Test, Ur: NEGATIVE

## 2020-05-18 MED ORDER — KETOROLAC TROMETHAMINE 30 MG/ML IJ SOLN
15.0000 mg | Freq: Once | INTRAMUSCULAR | Status: DC
  Filled 2020-05-18: qty 1

## 2020-05-18 MED ORDER — METOCLOPRAMIDE HCL 5 MG/ML IJ SOLN
10.0000 mg | Freq: Once | INTRAMUSCULAR | Status: DC
  Filled 2020-05-18: qty 2

## 2020-05-18 MED ORDER — PREDNISONE 10 MG (21) PO TBPK: ORAL_TABLET | ORAL | 0 refills | Status: DC

## 2020-05-18 MED ORDER — DEXAMETHASONE SODIUM PHOSPHATE 10 MG/ML IJ SOLN
8.0000 mg | Freq: Once | INTRAMUSCULAR | Status: DC
  Filled 2020-05-18: qty 1

## 2020-05-18 MED ORDER — SODIUM CHLORIDE 0.9 % IV BOLUS
1000.0000 mL | Freq: Once | INTRAVENOUS | Status: AC
  Administered 2020-05-18: 1000 mL via INTRAVENOUS

## 2020-05-18 NOTE — Discharge Instructions (Signed)
Headache Your lab results showed no acute abnormalities.  I do recommend going forward with blood donation, as planned. For future headaches please try the following regimen: Antiinflammatory medications: Take 600 mg of ibuprofen every 6 hours or 440 mg (over the counter dose) to 500 mg (prescription dose) of naproxen every 12 hours.  Use this regimen until headache subsides for up to 3 days .  After this time, these medications may be used as needed for pain. Take these medications with food to avoid upset stomach. Choose only one of these medications, do not take them together. Acetaminophen: Should you continue to have additional pain while taking the ibuprofen or naproxen, you may add in acetaminophen (generic for Tylenol) as needed. Your daily total maximum amount of acetaminophen from all sources should be limited to 4000mg /day for persons without liver problems, or 2000mg /day for those with liver problems. Prednisone: Take the prednisone, as prescribed, until finished. If you are a diabetic, please know prednisone can raise your blood sugar temporarily. Hydration: Have a goal of about a half liter of water every couple hours to stay well hydrated.   Sleep: Please be sure to get plenty of sleep with a goal of 8 hours per night. Having a regular bed time and bedtime routine can help with this.  Screens: Reduce the amount of time you are in front of screens.  Take about a 5-10-minute break every hour or every couple hours to give your eyes rest.  Do not use screens in dark rooms.  Glasses with a blue light filter may also help reduce eye fatigue.  Stress: Take steps to reduce stress as much as possible.   Follow up: Follow-up with your primary care provider on this issue.  I also highly recommend following up with a neurologist.  More testing, including MRI, is typically necessary with this kind of presentation.

## 2020-05-18 NOTE — ED Triage Notes (Signed)
Pt with right sided Headache for a week, pt has a feeling of right side of face that feels wet, also sharp pains on right side, c/o tenderness on same side.  Body aches. Denies any numbness or tingling.  + nausea

## 2020-05-18 NOTE — ED Notes (Signed)
Pt in bed, iv placed and blood drawn and taken to the lab, ns infusion started.  Explained to pt the reasons for medications, pt expressed concerns about sensitivity to medications and ingredients, explained that pa had talked with pharmacy and they are thought to not contain animal byproducts.  Talked to pt about being active participant in healthcare decisions, pt states that she would like to hold on medications and covid swab.  Explained to pt that she could change her mind at any time.

## 2020-05-18 NOTE — ED Provider Notes (Signed)
[facility] EMERGENCY DEPARTMENT Provider Note   CSN: [id number] Arrival date & time: 05/18/20  1208     History Chief Complaint  Patient presents with  . Headache    Lindsey Jensen is a 34 y.o. female.  HPI      Lindsey Jensen is a 34 y.o. female, with a history of asthma, presenting to the ED with headache for the past week.   She will have underlying soreness and tenderness to the right side of the head head in the parietal region as well as just anterior to the right ear, rated 4/10, radiating with sharp pain intermittently with 8/10 pain into the right side of the face.  She states it typically does not prevent her from sleeping, but when she wakes up and starts going about her day her pain recurs. Intermittent, bilateral blurry vision.  She has not had this before.  She has tried Tylenol for the pain without resolution.  She also thought it could be connected with her allergies and thus has been taking Allegra and Benadryl intermittently. She denies any recent rashes or illness.  Denies fever/chills, neck pain/stiffness, vision loss, numbness, weakness, syncope, dizziness, chest pain, shortness of breath, speech abnormalities, difficulty swallowing, or any other complaints.   Past Medical History:  Diagnosis Date  . Contraceptive education 01/26/2014  . Medical history non-contributory   . Moderate persistent asthma, uncomplicated 05/19/2019    Patient Active Problem List   Diagnosis Date Noted  . Erythrocytosis 01/25/2020  . 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 Cough 06/17/2019  . Oral candida 06/17/2019  . Seasonal and perennial allergic rhinitis 05/19/2019  . Moderate persistent asthma, uncomplicated 05/19/2019  . Tobacco use 10/07/2017  . Generalized anxiety disorder 08/23/2013    Past Surgical History:  Procedure Laterality Date  . NO PAST SURGERIES       OB History    Gravida  1   Para  1   Term  1   Preterm  0   AB  0   Living  1     SAB  0   TAB  0   Ectopic  0   Multiple  0   Live Births  1           Family History  Problem Relation Age of Onset  . Heart disease Father        heart attack  . Allergic rhinitis Father   . Diabetes Mother   . Depression Mother   . Anxiety disorder Mother   . Heart disease Mother   . Heart disease Maternal Grandmother        CHF  . Asthma Maternal Grandmother   . Diabetes Maternal Grandmother   . High Cholesterol Sister   . Kidney Stones Brother   . Alzheimer's disease Maternal Grandfather   . Asthma Maternal Grandfather   . Diabetes Maternal Uncle   . Angioedema Neg Hx   . Immunodeficiency Neg Hx     Social History   Tobacco Use  . Smoking status: Current Every Day Smoker    Packs/day: 0.50    Years: 14.00    Pack years: 7.00    Types: Cigarettes  . Smokeless tobacco: Never Used  Substance Use Topics  . Alcohol use: Yes    Alcohol/week: 14.0 standard drinks    Types: 14 Glasses of wine per week    Comment: glass of wine every night    . Drug use: No    Home  Medications Prior to Admission medications   Medication Sig Start Date End Date Taking? Authorizing Provider  azelastine (ASTELIN) 0.1 % nasal spray Use 2 sprays in each nostril 1-2 times daily as needed. 04/10/20   Valentina Shaggy, MD  beclomethasone (QVAR REDIHALER) 80 MCG/ACT inhaler Inhale 2 puffs into the lungs 2 (two) times daily. Patient taking differently: Inhale 2 puffs into the lungs as needed.  11/14/19   Laurin Coder, MD  Cholecalciferol (VITAMIN D) 50 MCG (2000 UT) tablet Take 2,000 Units by mouth daily.    [provider]  EPINEPHrine (EPIPEN 2-PAK) 0.3 mg/0.3 mL IJ SOAJ injection Use as directed for severe allergic reaction 07/20/19   Valentina Shaggy, MD  fluticasone [facility]) 50 MCG/ACT nasal spray Place 2 sprays into both nostrils daily. 03/25/20   Valentina Shaggy, MD  levalbuterol [facility] HFA) 45 MCG/ACT inhaler Inhale 1-2 puffs into the lungs every 4 (four) hours as  needed for wheezing.    [provider]  nicotine (NICODERM CQ - DOSED IN MG/24 HOURS) 21 mg/24hr patch Place 1 patch (21 mg total) onto the skin daily. Patient not taking: Reported on 04/10/2020 04/09/20   Derek Jack, MD  predniSONE (DELTASONE) 10 MG tablet Take 1 tablet (10 mg total) by mouth daily with breakfast. 04/23/20   Valentina Shaggy, MD  predniSONE (STERAPRED UNI-PAK 21 TAB) 10 MG (21) TBPK tablet Take 6 tabs (60mg ) day 1, 5 tabs (50mg ) day 2, 4 tabs (40mg ) day 3, 3 tabs (30mg ) day 4, 2 tabs (20mg ) day 5, and 1 tab (10mg ) day 6. 05/18/20   Mana Haberl C, PA-C    Allergies    Beef-derived products, Dairy aid [lactase], Meat [alpha-gal], Pork-derived products, and Shellfish allergy  Review of Systems   Review of Systems  Constitutional: Negative for fever.  HENT: Negative for facial swelling, sore throat and trouble swallowing.   Respiratory: Negative for cough and shortness of breath.   Cardiovascular: Negative for chest pain.  Gastrointestinal: Negative for abdominal pain, diarrhea, nausea and vomiting.  Musculoskeletal: Negative for neck pain.  Neurological: Positive for headaches. Negative for dizziness, seizures, syncope, facial asymmetry, speech difficulty, weakness, light-headedness and numbness.  Psychiatric/Behavioral: Negative for confusion.  All other systems reviewed and are negative.   Physical Exam Updated Vital Signs BP (!) 143/102 (BP Location: Right Arm)   Pulse 79   Temp 98 F (36.7 C) (Oral)   Resp 12   Ht 5\' 8"  (1.727 m)   Wt 73.9 kg   LMP 05/12/2020   SpO2 99%   BMI 24.78 kg/m   Physical Exam Vitals and nursing note reviewed.  Constitutional:      General: She is not in acute distress.    Appearance: She is well-developed. She is not diaphoretic.  HENT:     Head: Normocephalic and atraumatic.      Comments: Tenderness in the area as shown.  Tapping in front of the ear since pain through the right side of the face.  No noted  muscle spasms, swelling, color abnormality, or muscle dysfunction.    Right Ear: Ear canal and external ear normal.     Left Ear: Tympanic membrane, ear canal and external ear normal.     Ears:     Comments: Cerumen in the right canal    Nose: Mucosal edema present.     Mouth/Throat:     Mouth: Mucous membranes are moist.     Pharynx: Oropharynx is clear.  Eyes:  Extraocular Movements: Extraocular movements intact.     Conjunctiva/sclera: Conjunctivae normal.     Pupils: Pupils are equal, round, and reactive to light.  Cardiovascular:     Rate and Rhythm: Normal rate and regular rhythm.     Pulses: Normal pulses.          Radial pulses are 2+ on the right side and 2+ on the left side.       Posterior tibial pulses are 2+ on the right side and 2+ on the left side.     Heart sounds: Normal heart sounds.     Comments: Tactile temperature in the extremities appropriate and equal bilaterally. Pulmonary:     Effort: Pulmonary effort is normal. No respiratory distress.     Breath sounds: Normal breath sounds.  Abdominal:     Palpations: Abdomen is soft.     Tenderness: There is no abdominal tenderness. There is no guarding.  Musculoskeletal:     Cervical back: Neck supple.     Right lower leg: No edema.     Left lower leg: No edema.  Lymphadenopathy:     Cervical: No cervical adenopathy.  Skin:    General: Skin is warm and dry.  Neurological:     Mental Status: She is alert and oriented to person, place, and time.     Comments: No noted acute cognitive deficit. Sensation grossly intact to light touch in the extremities.   Grip strengths equal bilaterally.   Strength 5/5 in all extremities.  No gait disturbance.  Coordination intact.  Cranial nerves III-XII grossly intact.  Handles oral secretions without noted difficulty.  No noted phonation or speech deficit. No facial droop.   Psychiatric:        Mood and Affect: Mood and affect normal.        Speech: Speech normal.         Behavior: Behavior normal.     ED Results / Procedures / Treatments   Labs (all labs ordered are listed, but only abnormal results are displayed) Labs Reviewed  CBC WITH DIFFERENTIAL/PLATELET - Abnormal; Notable for the following components:      Result Value   RBC 6.02 (*)    Hemoglobin 18.7 (*)    HCT 58.5 (*)    RDW 17.3 (*)    All other components within normal limits  SARS CORONAVIRUS 2 BY RT PCR (HOSPITAL ORDER, PERFORMED IN [place] [facility])  PREGNANCY, URINE  COMPREHENSIVE METABOLIC PANEL    EKG None  Radiology No results found.  Procedures Procedures (including critical care time)  Medications Ordered in ED Medications  ketorolac (TORADOL) 30 MG/ML injection 15 mg (15 mg Intravenous Not Given 05/18/20 1440)  metoCLOPramide (REGLAN) injection 10 mg (10 mg Intravenous Not Given 05/18/20 1440)  dexamethasone (DECADRON) injection 8 mg (8 mg Intravenous Not Given 05/18/20 1439)  sodium chloride 0.9 % bolus 1,000 mL (1,000 mLs Intravenous New Bag/Given 05/18/20 1356)    ED Course  I have reviewed the triage vital signs and the nursing notes.  Pertinent labs & imaging results that were available during my care of the patient were reviewed by me and considered in my medical decision making (see chart for details).  Clinical Course as of May 18 1532  Sat May 18, 2020  1314 Spoke with Lawson Fiscal, pharmacist.  We discussed the patient's concerns for some of the proposed medications containing animal byproducts. Lawson Fiscal researched each of the proposed medications and states they do not contain any animal byproducts.   [  SJ]  1501 Patient states she is scheduled to donate blood tomorrow.  Hemoglobin(!): 18.7 [SJ]  1517 Patient states her headache has significantly improved.  She has not developed any additional symptoms.  She has not had instances of vision abnormalities since ED arrival.   [SJ]    Clinical Course User Index [SJ] Jamella Grayer, Hillard Danker, PA-C   MDM  Rules/Calculators/A&P                      Patient presents with a complaint of headache as well as right-sided facial pain. She has no focal neurologic deficits.  The distribution of her pain as well as the tenderness to the right side of the face would make consideration of trigeminal pain, such as trigeminal neuralgia, a possible diagnosis. Unfortunately, I do not have access to MRI at this facility at this time. I discussed CT imaging.  Patient does not have any known risk factors for malignancy, regardless this imaging study was discussed with the patient, as well as its limitations.  Patient declined during this visit. Patient had quite a few questions about the proposed treatment of her pain.  We discussed the medications individually, patient initially agreed, but then declined to allow the medications to be administered.  Patient states that her symptoms improved and she does not feel comfortable taking medication she has not taken before. I discussed recommendation for neurology follow-up as well as further evaluation, such as with MRI.  The patient was given instructions for home care as well as return precautions. Patient voices understanding of these instructions, accepts the plan, and is comfortable with discharge.  I reviewed and interpreted the patient's labs.   Final Clinical Impression(s) / ED Diagnoses Final diagnoses:  Right-sided headache  Right sided facial pain    Rx / DC Orders ED Discharge Orders         Ordered    predniSONE (STERAPRED UNI-PAK 21 TAB) 10 MG (21) TBPK tablet     05/18/20 1529           Anselm Pancoast, PA-C 05/18/20 1544    Eber Hong, MD 05/20/20 [phone]

## 2020-05-18 NOTE — ED Notes (Signed)
Pt in bed, pt c/o R temporal headache for the past week, pt states that it started one morning and was present upon awakening, states that she sometimes gets headaches when her H/H is high and she has gone for therapeutic blood draws in the past.  Pt reports that sometimes when her headache is bad she gets dizzy and her vision is blurry, denies these symptoms at this time.  Pt moving all extremities, resps even and unlabored, denies nausea or confusion.  Provider at bedside

## 2020-05-18 NOTE — ED Notes (Signed)
Also c/o dizziness. 

## 2020-05-24 ENCOUNTER — Ambulatory Visit (INDEPENDENT_AMBULATORY_CARE_PROVIDER_SITE_OTHER): Payer: BC Managed Care – PPO

## 2020-05-24 DIAGNOSIS — J309 Allergic rhinitis, unspecified: Secondary | ICD-10-CM

## 2020-05-26 ENCOUNTER — Encounter (HOSPITAL_COMMUNITY): Payer: Self-pay

## 2020-05-28 ENCOUNTER — Other Ambulatory Visit: Payer: Self-pay

## 2020-05-28 ENCOUNTER — Encounter: Payer: Self-pay | Admitting: Family Medicine

## 2020-05-28 ENCOUNTER — Ambulatory Visit: Payer: BC Managed Care – PPO | Admitting: Family Medicine

## 2020-05-28 VITALS — BP 132/86 | HR 89 | Temp 97.2°F | Ht 68.0 in | Wt 166.4 lb

## 2020-05-28 DIAGNOSIS — D751 Secondary polycythemia: Secondary | ICD-10-CM | POA: Diagnosis not present

## 2020-05-28 DIAGNOSIS — Z720 Tobacco use: Secondary | ICD-10-CM

## 2020-05-28 DIAGNOSIS — F411 Generalized anxiety disorder: Secondary | ICD-10-CM | POA: Diagnosis not present

## 2020-05-28 DIAGNOSIS — J4540 Moderate persistent asthma, uncomplicated: Secondary | ICD-10-CM

## 2020-05-28 MED ORDER — NICOTINE 21 MG/24HR TD PT24: 21.0000 mg | MEDICATED_PATCH | Freq: Every day | TRANSDERMAL | 2 refills | Status: DC

## 2020-05-28 NOTE — Patient Instructions (Addendum)
I appreciate the opportunity to provide you with care for your health and wellness. Today we discussed: established care    Follow up:  3 months   No labs or referrals today  Let me know about your feelings on starting a medication for anxiety   Great to meet you today!  Continue to work on smoking reduction. :)  Please continue to practice social distancing to keep you, your family, and our community safe.  If you must go out, please wear a mask and practice good handwashing.  It was a pleasure to see you and I look forward to continuing to work together on your health and well-being. Please do not hesitate to call the office if you need care or have questions about your care.  Have a wonderful day and week. With Gratitude, Tereasa Coop, DNP, AGNP-BC

## 2020-05-28 NOTE — Progress Notes (Signed)
Subjective:  Patient ID: Lindsey Jensen, female    DOB: [birth year]  Age: 34 y.o. MRN: [id number]  CC:  Chief Complaint  Patient presents with   New Patient (Initial Visit)    was [place] pt does want to address a problem with red blood cells as they keep going up sees Dr Kirtland Bouchard at [facility]    Knee Problem    right knee bruised from fall at work       HPI  HPI Lindsey Jensen is a 34 year old female patient who presents today to establish care.  He has a history of allergy, asthma, smoker, elevated red blood cells, anxiety.  She has a few concerns to discuss here in the office.   Her most prominent concern is the ongoing erythrocytosis of but she has centimeters hematology oncology for.  She is 1/2 pack/day smoker since 2007.  Is willing to quit smoking.  Has asked for nicotine patches today.  She has positive erythromelalgia.  Echo from March was in normal range with good EF function.  CT scan was declined by insurance.  And even if it would be approved by insurance she still has a high co-pay.  Though ultrasound of abdomen was ordered which was fairly unremarkable.  She is encouraged to have phlebotomy every 7 to 10 days for 2 times. She reports that everything was okay until she had her son in June 2019 and that is when things started changing.  Sees a dentist regularly. Does get right-sided headaches, some blurred vision as well.  Has not seen the eye doctor since 2020.  Wears glasses.  He does have anxiety however she is not on any medication for this.  Is unsure if she wants to start a medication.  Versus talk to somebody.    Today patient denies signs and symptoms of COVID 19 infection including fever, chills, cough, shortness of breath, and headache. Past Medical, Surgical, Social History, Allergies, and Medications have been Reviewed.   Past Medical History:  Diagnosis Date   Allergy    Phreesia 05/26/2020   Anxiety    Phreesia 05/26/2020   Asthma    Phreesia 05/26/2020    Contraceptive education 01/26/2014   Medical history non-contributory    Moderate persistent asthma, uncomplicated 05/19/2019    Current Meds  Medication Sig   azelastine (ASTELIN) 0.1 % nasal spray Use 2 sprays in each nostril 1-2 times daily as needed.   beclomethasone (QVAR REDIHALER) 80 MCG/ACT inhaler Inhale 2 puffs into the lungs 2 (two) times daily. (Patient taking differently: Inhale 2 puffs into the lungs as needed. )   Cholecalciferol (VITAMIN D) 50 MCG (2000 UT) tablet Take 2,000 Units by mouth daily.   EPINEPHrine (EPIPEN 2-PAK) 0.3 mg/0.3 mL IJ SOAJ injection Use as directed for severe allergic reaction   fluticasone (FLONASE) 50 MCG/ACT nasal spray Place 2 sprays into both nostrils daily.   levalbuterol (XOPENEX HFA) 45 MCG/ACT inhaler Inhale 1-2 puffs into the lungs every 4 (four) hours as needed for wheezing.   predniSONE (DELTASONE) 10 MG tablet Take 1 tablet (10 mg total) by mouth daily with breakfast.    ROS:  Review of Systems  Constitutional: Negative.   HENT: Negative.   Eyes: Negative.   Respiratory: Negative.   Cardiovascular: Negative.   Gastrointestinal: Negative.   Genitourinary: Negative.   Musculoskeletal: Negative.   Skin: Negative.   Neurological: Negative.   Endo/Heme/Allergies: Negative.   Psychiatric/Behavioral: Negative.   All other systems reviewed and  are negative.   Objective:   Today's Vitals: BP 132/86 (BP Location: Left Arm, Patient Position: Sitting, Cuff Size: Normal)    Pulse 89    Temp (!) 97.2 F (36.2 C) (Temporal)    Ht 5\' 8"  (1.727 m)    Wt 166 lb 6.4 oz (75.5 kg)    LMP 05/12/2020    SpO2 98%    BMI 25.30 kg/m  Vitals with BMI 05/28/2020 05/18/2020 05/18/2020  Height 5\' 8"  - -  Weight 166 lbs 6 oz - -  BMI 40.98 - -  Systolic 119 - -  Diastolic 86 - -  Pulse 89 77 68     Physical Exam Vitals and nursing note reviewed.  Constitutional:      Appearance: Normal appearance. She is well-developed, well-groomed and  overweight.  HENT:     Head: Normocephalic and atraumatic.     Right Ear: External ear normal.     Left Ear: External ear normal.     Mouth/Throat:     Comments: Mask in place  Eyes:     General:        Right eye: No discharge.        Left eye: No discharge.     Conjunctiva/sclera: Conjunctivae normal.  Cardiovascular:     Rate and Rhythm: Normal rate and regular rhythm.     Pulses: Normal pulses.     Heart sounds: Normal heart sounds.  Pulmonary:     Effort: Pulmonary effort is normal.     Breath sounds: Normal breath sounds.  Musculoskeletal:        General: Normal range of motion.     Cervical back: Normal range of motion and neck supple.  Skin:    General: Skin is warm.     Findings: Bruising present.     Comments: Right knee  Neurological:     General: No focal deficit present.     Mental Status: She is alert and oriented to person, place, and time.  Psychiatric:        Attention and Perception: Attention and perception normal.        Mood and Affect: Mood and affect normal.        Speech: Speech normal.        Behavior: Behavior normal. Behavior is cooperative.        Thought Content: Thought content normal.        Cognition and Memory: Cognition and memory normal.        Judgment: Judgment normal.     Comments: Good communication.  Good eye contact.  Pleasant throughout conversation     GAD 7 : Generalized Anxiety Score 05/28/2020  Nervous, Anxious, on Edge 3  Control/stop worrying 1  Worry too much - different things 1  Trouble relaxing 0  Restless 0  Easily annoyed or irritable 3  Afraid - awful might happen 1  Total GAD 7 Score 9  Anxiety Difficulty Somewhat difficult   Depression screen [facility] 2/9 05/28/2020 10/21/2017 10/07/2017 05/13/2017  Decreased Interest 0 0 0 1  Down, Depressed, Hopeless 0 0 0 1  PHQ - 2 Score 0 0 0 2  Altered sleeping 0 0 1 0  Tired, decreased energy 0 1 1 1   Change in appetite 0 1 2 0  Feeling bad or failure about yourself  0 1 0  0  Trouble concentrating 0 0 0 0  Moving slowly or fidgety/restless 0 0 0 0  Suicidal thoughts 0 0 0 0  PHQ-9 Score 0 3 4 3   Difficult doing work/chores Not difficult at all Not difficult at all Not difficult at all Not difficult at all      Assessment   1. Erythrocytosis   2. Moderate persistent asthma, uncomplicated   3. Tobacco use   4. Generalized anxiety disorder     Tests ordered No orders of the defined types were placed in this encounter.    Plan: Please see assessment and plan per problem list above.   Meds ordered this encounter  Medications   nicotine (NICODERM CQ - DOSED IN MG/24 HOURS) 21 mg/24hr patch    Sig: Place 1 patch (21 mg total) onto the skin daily.    Dispense:  28 patch    Refill:  2    Order Specific Question:   Supervising Provider    Answer:   [[id number]]    Patient to follow-up in 3 months   Kerri Perches, NP

## 2020-05-31 ENCOUNTER — Ambulatory Visit (INDEPENDENT_AMBULATORY_CARE_PROVIDER_SITE_OTHER): Payer: BC Managed Care – PPO

## 2020-05-31 DIAGNOSIS — J309 Allergic rhinitis, unspecified: Secondary | ICD-10-CM | POA: Diagnosis not present

## 2020-06-03 DIAGNOSIS — I83811 Varicose veins of right lower extremities with pain: Secondary | ICD-10-CM | POA: Diagnosis not present

## 2020-06-03 DIAGNOSIS — M79604 Pain in right leg: Secondary | ICD-10-CM | POA: Diagnosis not present

## 2020-06-04 NOTE — Assessment & Plan Note (Signed)
Followed by hematology.  Please see notes in epic.

## 2020-06-04 NOTE — Assessment & Plan Note (Signed)
She is Dr. Dellis Anes.  Reports fairly well-controlled at this time.  Is working on smoking cessation.

## 2020-06-04 NOTE — Assessment & Plan Note (Signed)
I have ordered the patch.  Asked about quitting: confirms they are currently smokes cigarettes Advise to quit smoking: Educated about QUITTING to reduce the risk of cancer, cardio and cerebrovascular disease. Assess willingness: Unwilling to quit at this time, but is working on cutting back. Assist with counseling and pharmacotherapy: Counseled for 5 minutes and literature provided. Arrange for follow up:  quitting follow up in 3 months and continue to offer help.

## 2020-06-05 ENCOUNTER — Encounter (HOSPITAL_COMMUNITY): Payer: Self-pay

## 2020-06-05 ENCOUNTER — Inpatient Hospital Stay (HOSPITAL_COMMUNITY): Payer: BC Managed Care – PPO | Attending: Hematology

## 2020-06-05 ENCOUNTER — Other Ambulatory Visit: Payer: Self-pay

## 2020-06-05 VITALS — BP 128/75 | HR 81 | Temp 97.7°F | Resp 18

## 2020-06-05 DIAGNOSIS — D582 Other hemoglobinopathies: Secondary | ICD-10-CM

## 2020-06-05 DIAGNOSIS — D751 Secondary polycythemia: Secondary | ICD-10-CM

## 2020-06-05 MED ORDER — SODIUM CHLORIDE 0.9 % IV SOLN: Freq: Once | INTRAVENOUS | Status: AC

## 2020-06-05 NOTE — Progress Notes (Signed)
Lindsey Jensen presents today for phlebotomy per MD orders. Phlebotomy procedure started at 1356 and ended at 1403. 500 cc removed. Patient tolerated procedure well. IV needle removed intact.  Patient tolerated phlebotomy with no complaints voiced. No s/s of distress noted.   Family at side.    Patient to receive NACL bolus verbal order Dr. Ellin Saba.    Patient tolerated bolus with no complaints voiced.  Peripheral IV site clean and dry with good blood return noted before and after hydration.  No bruising or swelling noted.  Band aid applied.  VSS with discharge and left ambulatory with no s/s of distress noted.

## 2020-06-07 ENCOUNTER — Ambulatory Visit (INDEPENDENT_AMBULATORY_CARE_PROVIDER_SITE_OTHER): Payer: BC Managed Care – PPO

## 2020-06-07 DIAGNOSIS — J309 Allergic rhinitis, unspecified: Secondary | ICD-10-CM

## 2020-06-09 ENCOUNTER — Encounter: Payer: Self-pay | Admitting: Family Medicine

## 2020-06-11 ENCOUNTER — Other Ambulatory Visit: Payer: Self-pay | Admitting: Family Medicine

## 2020-06-11 DIAGNOSIS — Z889 Allergy status to unspecified drugs, medicaments and biological substances status: Secondary | ICD-10-CM | POA: Insufficient documentation

## 2020-06-11 DIAGNOSIS — R5383 Other fatigue: Secondary | ICD-10-CM | POA: Insufficient documentation

## 2020-06-11 DIAGNOSIS — D751 Secondary polycythemia: Secondary | ICD-10-CM

## 2020-06-11 DIAGNOSIS — E559 Vitamin D deficiency, unspecified: Secondary | ICD-10-CM

## 2020-06-11 HISTORY — DX: Allergy status to unspecified drugs, medicaments and biological substances: Z88.9

## 2020-06-21 DIAGNOSIS — D751 Secondary polycythemia: Secondary | ICD-10-CM | POA: Diagnosis not present

## 2020-06-21 DIAGNOSIS — D539 Nutritional anemia, unspecified: Secondary | ICD-10-CM | POA: Diagnosis not present

## 2020-06-21 DIAGNOSIS — E559 Vitamin D deficiency, unspecified: Secondary | ICD-10-CM | POA: Diagnosis not present

## 2020-06-21 DIAGNOSIS — R5383 Other fatigue: Secondary | ICD-10-CM | POA: Diagnosis not present

## 2020-06-22 LAB — MAGNESIUM: Magnesium: 2.2 mg/dL (ref 1.6–2.3)

## 2020-06-22 LAB — CBC WITH DIFFERENTIAL/PLATELET
Basophils Absolute: 0.1 10*3/uL (ref 0.0–0.2)
Basos: 1 %
EOS (ABSOLUTE): 0.1 10*3/uL (ref 0.0–0.4)
Eos: 1 %
Hematocrit: 58.8 % — ABNORMAL HIGH (ref 34.0–46.6)
Hemoglobin: 19.4 g/dL — ABNORMAL HIGH (ref 11.1–15.9)
Immature Grans (Abs): 0 10*3/uL (ref 0.0–0.1)
Immature Granulocytes: 0 %
Lymphocytes Absolute: 2.1 10*3/uL (ref 0.7–3.1)
Lymphs: 31 %
MCH: 31.4 pg (ref 26.6–33.0)
MCHC: 33 g/dL (ref 31.5–35.7)
MCV: 95 fL (ref 79–97)
Monocytes Absolute: 0.6 10*3/uL (ref 0.1–0.9)
Monocytes: 9 %
Neutrophils Absolute: 3.8 10*3/uL (ref 1.4–7.0)
Neutrophils: 58 %
Platelets: 139 10*3/uL — ABNORMAL LOW (ref 150–450)
RBC: 6.17 x10E6/uL — ABNORMAL HIGH (ref 3.77–5.28)
RDW: 15.7 % — ABNORMAL HIGH (ref 11.7–15.4)
WBC: 6.6 10*3/uL (ref 3.4–10.8)

## 2020-06-22 LAB — PTH, INTACT AND CALCIUM
Calcium: 9.1 mg/dL (ref 8.7–10.2)
PTH: 25 pg/mL (ref 15–65)

## 2020-06-22 LAB — VITAMIN B12: Vitamin B-12: 349 pg/mL (ref 232–1245)

## 2020-06-22 LAB — C-REACTIVE PROTEIN: CRP: <1 mg/L (ref 0–10)

## 2020-06-22 LAB — TSH: TSH: 0.904 u[IU]/mL (ref 0.450–4.500)

## 2020-06-22 LAB — VITAMIN D 25 HYDROXY (VIT D DEFICIENCY, FRACTURES): Vit D, 25-Hydroxy: 39.3 ng/mL (ref 30.0–100.0)

## 2020-07-01 DIAGNOSIS — J3081 Allergic rhinitis due to animal (cat) (dog) hair and dander: Secondary | ICD-10-CM

## 2020-07-01 NOTE — Progress Notes (Signed)
VIALS EXP 07-01-21 

## 2020-07-02 DIAGNOSIS — J3089 Other allergic rhinitis: Secondary | ICD-10-CM | POA: Diagnosis not present

## 2020-07-03 ENCOUNTER — Ambulatory Visit (INDEPENDENT_AMBULATORY_CARE_PROVIDER_SITE_OTHER): Payer: BC Managed Care – PPO

## 2020-07-03 DIAGNOSIS — J309 Allergic rhinitis, unspecified: Secondary | ICD-10-CM | POA: Diagnosis not present

## 2020-07-10 ENCOUNTER — Ambulatory Visit (INDEPENDENT_AMBULATORY_CARE_PROVIDER_SITE_OTHER): Payer: BC Managed Care – PPO

## 2020-07-10 DIAGNOSIS — J309 Allergic rhinitis, unspecified: Secondary | ICD-10-CM

## 2020-07-11 ENCOUNTER — Other Ambulatory Visit (HOSPITAL_COMMUNITY): Payer: Self-pay | Admitting: Hematology

## 2020-07-11 DIAGNOSIS — Z720 Tobacco use: Secondary | ICD-10-CM

## 2020-07-17 ENCOUNTER — Ambulatory Visit (INDEPENDENT_AMBULATORY_CARE_PROVIDER_SITE_OTHER): Payer: BC Managed Care – PPO

## 2020-07-17 DIAGNOSIS — J309 Allergic rhinitis, unspecified: Secondary | ICD-10-CM

## 2020-07-18 ENCOUNTER — Other Ambulatory Visit: Payer: Self-pay

## 2020-07-18 ENCOUNTER — Inpatient Hospital Stay (HOSPITAL_BASED_OUTPATIENT_CLINIC_OR_DEPARTMENT_OTHER): Payer: BC Managed Care – PPO | Admitting: Hematology

## 2020-07-18 ENCOUNTER — Inpatient Hospital Stay (HOSPITAL_COMMUNITY): Payer: BC Managed Care – PPO | Attending: Hematology

## 2020-07-18 VITALS — BP 123/83 | HR 94 | Temp 99.0°F | Resp 18 | Wt 163.0 lb

## 2020-07-18 DIAGNOSIS — D751 Secondary polycythemia: Secondary | ICD-10-CM | POA: Insufficient documentation

## 2020-07-18 LAB — CBC WITH DIFFERENTIAL/PLATELET
Abs Immature Granulocytes: 0.01 10*3/uL (ref 0.00–0.07)
Basophils Absolute: 0.1 10*3/uL (ref 0.0–0.1)
Basophils Relative: 1 %
Eosinophils Absolute: 0.1 10*3/uL (ref 0.0–0.5)
Eosinophils Relative: 1 %
HCT: 57.2 % — ABNORMAL HIGH (ref 36.0–46.0)
Hemoglobin: 18.4 g/dL — ABNORMAL HIGH (ref 12.0–15.0)
Immature Granulocytes: 0 %
Lymphocytes Relative: 28 %
Lymphs Abs: 2 10*3/uL (ref 0.7–4.0)
MCH: 31.3 pg (ref 26.0–34.0)
MCHC: 32.2 g/dL (ref 30.0–36.0)
MCV: 97.4 fL (ref 80.0–100.0)
Monocytes Absolute: 0.6 10*3/uL (ref 0.1–1.0)
Monocytes Relative: 9 %
Neutro Abs: 4.4 10*3/uL (ref 1.7–7.7)
Neutrophils Relative %: 61 %
Platelets: 153 10*3/uL (ref 150–400)
RBC: 5.87 MIL/uL — ABNORMAL HIGH (ref 3.87–5.11)
RDW: 15.5 % (ref 11.5–15.5)
WBC: 7.1 10*3/uL (ref 4.0–10.5)
nRBC: 0 % (ref 0.0–0.2)

## 2020-07-18 NOTE — Patient Instructions (Signed)
[facility] Discharge Instructions  You were seen today by Dr. Katragadda. He went over your recent results. Dr. Katragadda will see you back in for labs and follow up.   Thank you for choosing [facility] at [facility] to provide your oncology and hematology care.  To afford each patient quality time with our provider, please arrive at least 15 minutes before your scheduled appointment time.   If you have a lab appointment with the Cancer Center please come in thru the Main Entrance and check in at the main information desk  You need to re-schedule your appointment should you arrive 10 or more minutes late.  We strive to give you quality time with our providers, and arriving late affects you and other patients whose appointments are after yours.  Also, if you no show three or more times for appointments you may be dismissed from the clinic at the providers discretion.     Again, thank you for choosing [facility].  Our hope is that these requests will decrease the amount of time that you wait before being seen by our physicians.       _____________________________________________________________  Should you have questions after your visit to [facility], please contact our office at [phone] between the hours of 8:00 a.m. and 4:30 p.m.  Voicemails left after 4:00 p.m. will not be returned until the following business day.  For prescription refill requests, have your pharmacy contact our office and allow 72 hours.    Cancer Center Support Programs:   > Cancer Support Group  2nd Tuesday of the month 1pm-2pm, Journey Room    

## 2020-07-18 NOTE — Progress Notes (Signed)
[facility] [address], [place] [address]   CLINIC:  Medical Oncology/Hematology  PCP:  Lindsey Finner, NP [address] [place] / [place] [address]  [phone]  REASON FOR VISIT:  Follow-up for erythrocytosis\  INTERVAL HISTORY:  Lindsey Jensen, a 34 y.o. female, returns for routine follow-up for her erythrocytosis. [name] was last seen on 04/09/2020 via telemedicine.  She has not yet quit smoking. She does have patches and her husband has stopped smoking, but she didn't want to wear the patch. She does state that she is going to try and work on stopping smoking. She is not able to take a lot of heat outside and states that she turns red rather quickly when outside during hot weather.   She is a vegetarian, so she does have a more difficultly time getting normal iron into her system. She notes that she had really healthy hair prior to her pregnancy and now her hair is dead and falls out easily; she is unsure if it is related to her iron stores.   She notes that her sister, who is 7 years older than her, has chronically low platelets.    REVIEW OF SYSTEMS:  Review of Systems  Constitutional: Negative.   HENT:  Negative.   Eyes: Negative.   Respiratory: Negative.        Env Allergies  Cardiovascular: Negative.   Gastrointestinal: Negative.   Endocrine: Negative.   Genitourinary: Negative.    Musculoskeletal: Negative.   Skin: Negative.   Neurological: Negative.   Hematological: Negative.   Psychiatric/Behavioral: Negative.   All other systems reviewed and are negative.   PAST MEDICAL/SURGICAL HISTORY:  Past Medical History:  Diagnosis Date   Allergy    Phreesia 05/26/2020   Anxiety    Phreesia 05/26/2020   Asthma    Phreesia 05/26/2020   Contraceptive education 01/26/2014   Medical history non-contributory    Moderate persistent asthma, uncomplicated 05/19/2019   Past Surgical History:  Procedure Laterality Date   NO PAST SURGERIES        SOCIAL HISTORY:  Social History   Socioeconomic History   Marital status: Married    Spouse name: Not on file   Number of children: 1   Years of education: Not on file   Highest education level: Bachelor's degree (e.g., BA, AB, BS)  Occupational History    Employer: Airline pilot  Tobacco Use   Smoking status: Current Every Day Smoker    Packs/day: 0.50    Years: 14.00    Pack years: 7.00    Types: Cigarettes   Smokeless tobacco: Never Used  Vaping Use   Vaping Use: Never used  Substance and Sexual Activity   Alcohol use: Yes    Alcohol/week: 14.0 standard drinks    Types: 14 Glasses of wine per week    Comment: glass of wine every night     Drug use: No   Sexual activity: Yes    Birth control/protection: None  Other Topics Concern   Not on file  Social History Narrative   Lives with husband and son      Dog: Pitbull: Lana       Enjoys: reading-reformed theology; fiction; drink wine, buy shoes, playing cards      Diet: eats all food groups outside of meat-some chicken at times   Caffeine: 3 cups of expresso daily    Water: 24 oz x 3 bottles daily  Wears seat belt   Does not use phone while driving   Smoke Retail buyer in a lock box   Social Determinants of Health   Financial Resource Strain: Low Risk    Difficulty of Paying Living Expenses: Not hard at all  Food Insecurity: No Food Insecurity   Worried About Programme researcher, broadcasting/film/video in the Last Year: Never true   Barista in the Last Year: Never true  Transportation Needs: No Transportation Needs   Lack of Transportation (Medical): No   Lack of Transportation (Non-Medical): No  Physical Activity: Inactive   Days of Exercise per Week: 0 days   Minutes of Exercise per Session: 0 min  Stress: Stress Concern Present   Feeling of Stress : To some extent  Social Connections: Moderately Isolated   Frequency of Communication with Friends and Family:  More than three times a week   Frequency of Social Gatherings with Friends and Family: More than three times a week   Attends Religious Services: Never   Database administrator or Organizations: No   Attends Engineer, structural: Never   Marital Status: Married  Catering manager Violence: Not At Risk   Fear of Current or Ex-Partner: No   Emotionally Abused: No   Physically Abused: No   Sexually Abused: No    FAMILY HISTORY:  Family History  Problem Relation Age of Onset   Heart disease Father        heart attack   Allergic rhinitis Father    Diabetes Mother    Depression Mother    Anxiety disorder Mother    Heart disease Mother    Heart disease Maternal Grandmother        CHF   Asthma Maternal Grandmother    Diabetes Maternal Grandmother    High Cholesterol Sister    Kidney Stones Brother    Alzheimer's disease Maternal Grandfather    Asthma Maternal Grandfather    Diabetes Maternal Uncle    Angioedema Neg Hx    Immunodeficiency Neg Hx     CURRENT MEDICATIONS:  Current Outpatient Medications  Medication Sig Dispense Refill   Cholecalciferol (VITAMIN D) 50 MCG (2000 UT) tablet Take 2,000 Units by mouth daily.     EPINEPHrine (EPIPEN 2-PAK) 0.3 mg/0.3 mL IJ SOAJ injection Use as directed for severe allergic reaction 2 each 2   levalbuterol (XOPENEX HFA) 45 MCG/ACT inhaler Inhale 1-2 puffs into the lungs every 4 (four) hours as needed for wheezing.     pantoprazole (PROTONIX) 40 MG tablet Take 40 mg by mouth 2 (two) times daily.     No current facility-administered medications for this visit.    ALLERGIES:  Allergies  Allergen Reactions   Beef-Derived Products Anaphylaxis    Alpha-Gal Allergy    Dairy Aid [Lactase] Other (See Comments)    SOB   Meat [Alpha-Gal] Palpitations    All mammal. Symptom asthma .   Pork-Derived Products Anaphylaxis    Alpha-Gal Allergy   Shellfish Allergy Other (See Comments)    SOB   Other  Rash and Itching    PHYSICAL EXAM:  Performance status (ECOG): 0 - Asymptomatic  Vitals:   07/18/20 1445  BP: 123/83  Pulse: 94  Resp: 18  Temp: 99 F (37.2 C)  SpO2: 100%   Wt Readings from Last 3 Encounters:  07/18/20 163 lb (73.9 kg)  05/28/20 166 lb 6.4 oz (75.5 kg)  05/18/20 163 lb (73.9 kg)   Physical  Exam Vitals and nursing note reviewed.  Constitutional:      Appearance: Normal appearance.  HENT:     Mouth/Throat:     Mouth: Mucous membranes are moist.  Eyes:     Conjunctiva/sclera:     Right eye: Exudate present.     Left eye: Exudate present.     Pupils: Pupils are equal, round, and reactive to light.  Cardiovascular:     Rate and Rhythm: Normal rate and regular rhythm.     Pulses: Normal pulses.     Heart sounds: Normal heart sounds.  Pulmonary:     Effort: Pulmonary effort is normal.     Breath sounds: Normal breath sounds.  Abdominal:     Palpations: Abdomen is soft. There is no mass.     Tenderness: There is no abdominal tenderness.  Musculoskeletal:     Cervical back: Neck supple.     Right lower leg: No edema.     Left lower leg: No edema.  Neurological:     Mental Status: She is alert and oriented to person, place, and time.  Psychiatric:        Mood and Affect: Mood normal.        Behavior: Behavior normal.     LABORATORY DATA:  I have reviewed the labs as listed.  CBC Latest Ref Rng & Units 07/18/2020 06/21/2020 05/18/2020  WBC 4.0 - 10.5 K/uL 7.1 6.6 6.8  Hemoglobin 12.0 - 15.0 g/dL 18.4(H) 19.4(H) 18.7(H)  Hematocrit 36 - 46 % 57.2(H) 58.8(H) 58.5(H)  Platelets 150 - 400 K/uL 153 139(L) 173   CMP Latest Ref Rng & Units 06/21/2020 05/18/2020 03/05/2020  Glucose 70 - 99 mg/dL - 98 91  BUN 6 - 20 mg/dL - 14 10  Creatinine 0.93 - 1.00 mg/dL - 2.67 1.24  Sodium 580 - 145 mmol/L - 136 140  Potassium 3.5 - 5.1 mmol/L - 3.7 4.5  Chloride 98 - 111 mmol/L - 101 102  CO2 22 - 32 mmol/L - 27 29  Calcium 8.7 - 10.2 mg/dL 9.1 8.9 9.4  Total  Protein 6.5 - 8.1 g/dL - 6.7 7.4  Total Bilirubin 0.3 - 1.2 mg/dL - 0.9 9.9(I)  Alkaline Phos 38 - 126 U/L - 73 68  AST 15 - 41 U/L - 15 15  ALT 0 - 44 U/L - 17 15      Component Value Date/Time   RBC 5.87 (H) 07/18/2020 1411   MCV 97.4 07/18/2020 1411   MCV 95 06/21/2020 1130   MCH 31.3 07/18/2020 1411   MCHC 32.2 07/18/2020 1411   RDW 15.5 07/18/2020 1411   RDW 15.7 (H) 06/21/2020 1130   LYMPHSABS 2.0 07/18/2020 1411   LYMPHSABS 2.1 06/21/2020 1130   MONOABS 0.6 07/18/2020 1411   EOSABS 0.1 07/18/2020 1411   EOSABS 0.1 06/21/2020 1130   BASOSABS 0.1 07/18/2020 1411   BASOSABS 0.1 06/21/2020 1130    DIAGNOSTIC IMAGING:  I have independently reviewed the scans and discussed with the patient. No results found.   ASSESSMENT:  Erythrocytosis 1.  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  Carboxyhemoglobin 4.2 (0.5-1.5) -Positive erythromelalgia. -Erythropoietin 12.7. -Phlebotomy on 02/19/2020.  She felt better after that with improvement in erythromelalgia.  However she started to have the symptoms back in the past few days. -We reviewed results from 03/05/2020, hemoglobin was 18.9, hematocrit was 59.4. -CALR and MPL mutation were negative. -Echo on 03/05/2020 shows EF 60 to 65% with normal function.  Atria were normal. -Ultrasound of the abdomen on 04/05/2020 did not show any cystic lesions in the liver or kidneys.  Spleen size was normal.   PLAN:  1.  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 -We reviewed labs from today which showed hemoglobin 18.4 and hematocrit 57.2. -Last phlebotomy was on 06/05/2020, 500 cc removed. -I have made a referral to one blood to have phlebotomy every 8 weeks.  We will continue CBC every 2 months. -I plan to see her back in 6 months for follow-up.  2.  Tobacco use: -We have counseled her to quit smoking which will likely improve her hemoglobin.   Orders placed this  encounter:  No orders of the defined types were placed in this encounter.    Doreatha MassedSreedhar Nayara Taplin, MD [facility] [phone]   I, Mal MistyAmber Handy, am acting as a scribe for Dr. Payton MccallumSreedhar Katagadda.  I, Doreatha MassedSreedhar Orabelle Rylee MD, have reviewed the above documentation for accuracy and completeness, and I agree with the above.

## 2020-07-31 ENCOUNTER — Ambulatory Visit (INDEPENDENT_AMBULATORY_CARE_PROVIDER_SITE_OTHER): Payer: BC Managed Care – PPO

## 2020-07-31 DIAGNOSIS — J309 Allergic rhinitis, unspecified: Secondary | ICD-10-CM

## 2020-08-07 ENCOUNTER — Telehealth: Payer: Self-pay

## 2020-08-07 NOTE — Telephone Encounter (Signed)
Noted - we will chat later this week.  Malachi Bonds, MD [facility]

## 2020-08-07 NOTE — Telephone Encounter (Signed)
Patient would like to discuss continuation of immunotherapy. She has been having very large local reaction and feeling crummy afterwards. She is scheduling an appointment to discuss this further with you.

## 2020-08-16 ENCOUNTER — Ambulatory Visit (INDEPENDENT_AMBULATORY_CARE_PROVIDER_SITE_OTHER): Payer: BC Managed Care – PPO | Admitting: Allergy & Immunology

## 2020-08-16 ENCOUNTER — Other Ambulatory Visit: Payer: Self-pay

## 2020-08-16 ENCOUNTER — Encounter: Payer: Self-pay | Admitting: Allergy & Immunology

## 2020-08-16 DIAGNOSIS — T782XXD Anaphylactic shock, unspecified, subsequent encounter: Secondary | ICD-10-CM

## 2020-08-16 DIAGNOSIS — J302 Other seasonal allergic rhinitis: Secondary | ICD-10-CM

## 2020-08-16 DIAGNOSIS — J3089 Other allergic rhinitis: Secondary | ICD-10-CM

## 2020-08-16 DIAGNOSIS — J4540 Moderate persistent asthma, uncomplicated: Secondary | ICD-10-CM

## 2020-08-16 MED ORDER — AZELASTINE HCL 0.1 % NA SOLN: 2.0000 | Freq: Two times a day (BID) | NASAL | 5 refills | Status: DC

## 2020-08-16 MED ORDER — LEVALBUTEROL TARTRATE 45 MCG/ACT IN AERO: 1.0000 | INHALATION_SPRAY | RESPIRATORY_TRACT | 1 refills | Status: DC | PRN

## 2020-08-16 MED ORDER — FLUTICASONE PROPIONATE 50 MCG/ACT NA SUSP: 2.0000 | Freq: Every day | NASAL | 5 refills | Status: DC

## 2020-08-16 MED ORDER — EPINEPHRINE 0.3 MG/0.3ML IJ SOAJ: INTRAMUSCULAR | 2 refills | Status: DC

## 2020-08-16 NOTE — Progress Notes (Signed)
RE: Lindsey Jensen MRN: [id number] DOB: 08/18/1986 Date of Telemedicine Visit: 08/16/2020  Referring provider: Freddy Finner, NP Primary care provider: Freddy Finner, NP  Chief Complaint: Asthma   Telemedicine Follow Up Visit via Telephone: I connected with Lindsey Jensen for a follow up on 08/21/20 by telephone and verified that I am speaking with the correct person using two identifiers.   I discussed the limitations, risks, security and privacy concerns of performing an evaluation and management service by telephone and the availability of in person appointments. I also discussed with the patient that there may be a patient responsible charge related to this service. The patient expressed understanding and agreed to proceed.  Patient is at home.  Provider is at the office.  Visit start time: 3:08 PM Visit end time: 3:28 PM Insurance consent/check in by: [facility] consent and medical assistant/nurse: Kayla  History of Present Illness:  She is a 34 y.o. female, who is being followed for seasonal and perennial allergic rhinitis as well as multiple food allergies and a working diagnosis of asthma. Her previous allergy office visit was in April 2021 with myself. At that visit, we continued with allergy shots at the same scheduled (Schedule A). We also continued with Allegra 180mg  once daily as well as Flonase two sprays per nostril daily. We also recommended restarting Astelin two sprays per nostril up to BID PRN. Her spirometry looked normal. We felt at the time that she might have had allergic asthma, which was improving with the initiation of the allergen immunotherapy. We continued with albuterol as needed. We recommended continued avoidance of all of her triggering foods. We were planning to retest in the fall 2021.   In the interim, she has continued to have adverse reactions to her allergy shots. She reports that she has had "crappy" post-injection effects. These are centered on large  local reactions. For instance, she had one episode where she had one that was the size of a soda can. However, she also reports that she has developed blurry eyes, dizziness, and vertigo following the shots. One of the episodes occurred when she had missed a shot after three weeks. She is open to ideas on making these post-shot reactions less severe, but she just wants to take a break for now.    [name] is on allergen immunotherapy. She receives two injections. Immunotherapy script #1 contains ragweed, dust mites and cockroach. She currently receives 0.89mL of the GREEN vial (1/1,000). Immunotherapy script #2 contains trees, cat and dog. She currently receives 0.36mL of the GREEN vial (1/1,000). She started shots July of 2020 and not yet reached maintenance.   She is wondering whether this is related at all to her other medical issues, including erythrocytosis. She is followed by Dr. 02-25-1974. This is JAK2 negative variety, thought to be secondary to smoking, per the last clinic note. She is having phlebotomies performed every 8 weeks and CBCs every 2 months.   Otherwise, there have been no changes to her past medical history, surgical history, family history, or social history.  Assessment and Plan:  [name] is a 34 y.o. female with:  Seasonal and perennial allergic rhinitis(dust mites, cat, dog, roach, trees, and ragweed)  Intermittent episodes of generalized malaise/fatigue for days following her allergy injections (despite antihistamines and prednisone)  Moderate persistent asthma, uncomplicated  Erythrocytosis (JAK2 negative)  Fully vaccinated to COVID19 38)   I am unsure what is going on with her allergy shots. I have never heard of  the extreme set of symptoms that she is experiencing after her allergy shots. I think we should be able to remix her allergen vials to include less extracts (such as dog, which I did push because she has a dog at home). Regardless, she is interested  in temporarily stopping her allergy shots until her next visit later this fall. We can see how she is doing without the shots on board, and if her symptoms have worsened, we can certain re-start her shots at that point.   Diagnostics: None.  Medication List:  Current Outpatient Medications  Medication Sig Dispense Refill  . azelastine (ASTELIN) 0.1 % nasal spray Place 2 sprays into both nostrils 2 (two) times daily. 30 mL 5  . Cholecalciferol (VITAMIN D) 50 MCG (2000 UT) tablet Take 2,000 Units by mouth daily.    Marland Kitchen EPINEPHrine (EPIPEN 2-PAK) 0.3 mg/0.3 mL IJ SOAJ injection Use as directed for severe allergic reaction 2 each 2  . levalbuterol (XOPENEX HFA) 45 MCG/ACT inhaler Inhale 1-2 puffs into the lungs every 4 (four) hours as needed for wheezing. 1 each 1  . pantoprazole (PROTONIX) 40 MG tablet Take 40 mg by mouth 2 (two) times daily.    . fluticasone (FLONASE) 50 MCG/ACT nasal spray Place 2 sprays into both nostrils daily. 16 g 5   No current facility-administered medications for this visit.   Allergies: Allergies  Allergen Reactions  . Beef-Derived Products Anaphylaxis    Alpha-Gal Allergy   . Dairy Aid [Lactase] Other (See Comments)    SOB  . Meat [Alpha-Gal] Palpitations    All mammal. Symptom asthma .  Marland Kitchen Pork-Derived Products Anaphylaxis    Alpha-Gal Allergy  . Shellfish Allergy Other (See Comments)    SOB  . Other Rash and Itching   I reviewed her past medical history, social history, family history, and environmental history and no significant changes have been reported from previous visits.  Review of Systems  Constitutional: Negative for activity change, appetite change, chills, fatigue and fever.  HENT: Negative for congestion, postnasal drip, rhinorrhea, sinus pressure and sore throat.   Eyes: Negative for pain, discharge, redness and itching.  Respiratory: Negative for shortness of breath, wheezing and stridor.   Gastrointestinal: Negative for diarrhea, nausea  and vomiting.  Endocrine: Negative for cold intolerance and heat intolerance.  Musculoskeletal: Negative for arthralgias, joint swelling and myalgias.  Skin: Negative for rash.  Allergic/Immunologic: Negative for environmental allergies and food allergies.    Objective:  Physical exam not obtained as encounter was done via telephone.   Previous notes and tests were reviewed.  I discussed the assessment and treatment plan with the patient. The patient was provided an opportunity to ask questions and all were answered. The patient agreed with the plan and demonstrated an understanding of the instructions.   The patient was advised to call back or seek an in-person evaluation if the symptoms worsen or if the condition fails to improve as anticipated.  I provided 20 minutes of non-face-to-face time during this encounter.  It was my pleasure to participate in Gonzales Rahe's care today. Please feel free to contact me with any questions or concerns.   Sincerely,  Alfonse Spruce, MD

## 2020-08-21 ENCOUNTER — Encounter: Payer: Self-pay | Admitting: Allergy & Immunology

## 2020-08-22 ENCOUNTER — Inpatient Hospital Stay (HOSPITAL_COMMUNITY): Payer: BC Managed Care – PPO

## 2020-08-28 ENCOUNTER — Encounter: Payer: Self-pay | Admitting: Family Medicine

## 2020-08-28 ENCOUNTER — Telehealth (INDEPENDENT_AMBULATORY_CARE_PROVIDER_SITE_OTHER): Payer: BC Managed Care – PPO | Admitting: Family Medicine

## 2020-08-28 ENCOUNTER — Other Ambulatory Visit: Payer: Self-pay

## 2020-08-28 VITALS — BP 123/83 | Ht 68.0 in | Wt 163.0 lb

## 2020-08-28 DIAGNOSIS — D751 Secondary polycythemia: Secondary | ICD-10-CM

## 2020-08-28 DIAGNOSIS — F419 Anxiety disorder, unspecified: Secondary | ICD-10-CM | POA: Diagnosis not present

## 2020-08-28 NOTE — Patient Instructions (Signed)
I appreciate the opportunity to provide you with care for your health and wellness. Today we discussed: overall health, anxiety  Follow up: 8 months for CPE fasting labs (morning appt)  No labs or referrals today  I hope that therapy will help you feel better and in more control of your anxiety.  Remember to set some alarms and write some notes for yourself and carry healthy snacks so that you can much of them throughout the day.  In addition always have a bottle water with you and take a sip of it every time you look at it.  Please continue to practice social distancing to keep you, your family, and our community safe.  If you must go out, please wear a mask and practice good handwashing.  It was a pleasure to see you and I look forward to continuing to work together on your health and well-being. Please do not hesitate to call the office if you need care or have questions about your care.  Have a wonderful day and week. With Gratitude, Tereasa Coop, DNP, AGNP-BC

## 2020-08-28 NOTE — Assessment & Plan Note (Signed)
Anxiety is uncontrolled at this time.  Has reached out to a therapist.  Is going to work on making little notes for herself and setting alarms to help with her water intake and eating better. Denies having any SI or HI.  Denies wanting any medication at this.  Wants to see if therapy can help first.

## 2020-08-28 NOTE — Progress Notes (Signed)
Virtual Visit via Telephone Note   This visit type was conducted due to national recommendations for restrictions regarding the COVID-19 Pandemic (e.g. social distancing) in an effort to limit this patient's exposure and mitigate transmission in our community.  Due to her co-morbid illnesses, this patient is at least at moderate risk for complications without adequate follow up.  This format is felt to be most appropriate for this patient at this time.  The patient did not have access to video technology/had technical difficulties with video requiring transitioning to audio format only (telephone).  All issues noted in this document were discussed and addressed.  No physical exam could be performed with this format.   Evaluation Performed:  Follow-up visit  Date:  08/28/2020   ID:  Lindsey Jensen, DOB 1986/03/12, MRN [id number]  Patient Location: Home Provider Location: Office/Clinic  Location of Patient: Home Location of Provider: Telehealth Consent was obtain for visit to be over via telehealth. I verified that I am speaking with the correct person using two identifiers.  PCP:  Perlie Mayo, NP   Chief Complaint:    14-monthfollow-up History of Present Illness:    Lindsey KOFMANis a 34y.o. female with history of allergies, asthma, urethral cytosis, anxiety.  Presents today for 336-monthollow-up overall she reports that she is doing well.  She goes every 8 weeks to possibly start having phlebotomy for reduction in hemoglobin.  We will go Friday to have a check on her labs.  Her hematologist ruled out the need for a bone marrow biopsy.  Overall she is doing well with that.  Her biggest concerns are that she is not eating well, her anxiety is very uncontrolled, her water intake has been poor, and she still smoking.  She has reached out to her therapist to see about getting therapy on a regular basis.  Does not want to do any medications at this time but is going to be more mindful of trying  to get herself situated.  The patient does not have symptoms concerning for COVID-19 infection (fever, chills, cough, or new shortness of breath).   Past Medical, Surgical, Social History, Allergies, and Medications have been Reviewed.  Past Medical History:  Diagnosis Date  . Allergy    Phreesia 05/26/2020  . Anxiety    Phreesia 05/26/2020  . Asthma    Phreesia 05/26/2020  . Contraceptive education 01/26/2014  . Erythrocytosis   . Medical history non-contributory   . Moderate persistent asthma, uncomplicated 04/27/57/8502 Past Surgical History:  Procedure Laterality Date  . NO PAST SURGERIES       Current Meds  Medication Sig  . azelastine (ASTELIN) 0.1 % nasal spray Place 2 sprays into both nostrils 2 (two) times daily.  . Cholecalciferol (VITAMIN D) 50 MCG (2000 UT) tablet Take 2,000 Units by mouth daily.  . Marland KitchenPINEPHrine (EPIPEN 2-PAK) 0.3 mg/0.3 mL IJ SOAJ injection Use as directed for severe allergic reaction  . fluticasone (FLONASE) 50 MCG/ACT nasal spray Place 2 sprays into both nostrils daily.  . Marland Kitchenevalbuterol (XOPENEX HFA) 45 MCG/ACT inhaler Inhale 1-2 puffs into the lungs every 4 (four) hours as needed for wheezing.     Allergies:   Beef-derived products, Dairy aid [lactase], Meat [alpha-gal], Pork-derived products, Shellfish allergy, and Other   ROS:   Please see the history of present illness.    All other systems reviewed and are negative.   Labs/Other Tests and Data Reviewed:    Recent  Labs: 05/18/2020: ALT 17; BUN 14; Creatinine, Ser 0.87; Potassium 3.7; Sodium 136 06/21/2020: Magnesium 2.2; TSH 0.904 07/18/2020: Hemoglobin 18.4; Platelets 153   Recent Lipid Panel No results found for: CHOL, TRIG, HDL, CHOLHDL, LDLCALC, LDLDIRECT  Wt Readings from Last 3 Encounters:  08/28/20 163 lb (73.9 kg)  07/18/20 163 lb (73.9 kg)  05/28/20 166 lb 6.4 oz (75.5 kg)     Objective:    Vital Signs:  BP 123/83   Ht _0  (1.727 m)   Wt 163 lb (73.9 kg)   BMI 24.78  kg/m    VITAL SIGNS:  reviewed GEN:  alert and oriented RESPIRATORY:  no shortness of breath in conversation  PSYCH:  normal affect and mood   GAD 7 : Generalized Anxiety Score 08/28/2020 05/28/2020  Nervous, Anxious, on Edge 3 3  Control/stop worrying 2 1  Worry too much - different things 2 1  Trouble relaxing 2 0  Restless 2 0  Easily annoyed or irritable 1 3  Afraid - awful might happen 2 1  Total GAD 7 Score 14 9  Anxiety Difficulty Very difficult Somewhat difficult   ASSESSMENT & PLAN:     1. Anxiety  2. Erythrocytosis    Time:   Today, I have spent 10 minutes with the patient with telehealth technology discussing the above problems.     Medication Adjustments/Labs and Tests Ordered: Current medicines are reviewed at length with the patient today.  Concerns regarding medicines are outlined above.   Tests Ordered: No orders of the defined types were placed in this encounter.   Medication Changes: No orders of the defined types were placed in this encounter.   Disposition:  Follow up 8 months for CPE with labs Signed, Perlie Mayo, NP  08/28/2020 10:13 AM     [place]

## 2020-08-28 NOTE — Assessment & Plan Note (Signed)
Followed closely by hematology.  Review notes in epic.  Will have updated lab work this week.  Phlebotomy every 8 weeks to help control hemoglobin level.  She was advised to increase water intake reduce anxiety and stop smoking.  She has been unable to do any of these per her.

## 2020-08-30 ENCOUNTER — Inpatient Hospital Stay (HOSPITAL_COMMUNITY): Payer: BC Managed Care – PPO | Attending: Hematology

## 2020-08-30 ENCOUNTER — Other Ambulatory Visit: Payer: Self-pay

## 2020-08-30 DIAGNOSIS — D751 Secondary polycythemia: Secondary | ICD-10-CM | POA: Diagnosis not present

## 2020-08-30 LAB — COMPREHENSIVE METABOLIC PANEL
ALT: 17 U/L (ref 0–44)
AST: 14 U/L — ABNORMAL LOW (ref 15–41)
Albumin: 3.8 g/dL (ref 3.5–5.0)
Alkaline Phosphatase: 62 U/L (ref 38–126)
Anion gap: 7 (ref 5–15)
BUN: 15 mg/dL (ref 6–20)
CO2: 28 mmol/L (ref 22–32)
Calcium: 8.9 mg/dL (ref 8.9–10.3)
Chloride: 101 mmol/L (ref 98–111)
Creatinine, Ser: 0.89 mg/dL (ref 0.44–1.00)
GFR calc Af Amer: >60 mL/min (ref >60)
GFR calc non Af Amer: >60 mL/min (ref >60)
Glucose, Bld: 82 mg/dL (ref 70–99)
Potassium: 4.1 mmol/L (ref 3.5–5.1)
Sodium: 136 mmol/L (ref 135–145)
Total Bilirubin: 0.7 mg/dL (ref 0.3–1.2)
Total Protein: 7 g/dL (ref 6.5–8.1)

## 2020-08-30 LAB — CBC WITH DIFFERENTIAL/PLATELET
Abs Immature Granulocytes: 0.01 10*3/uL (ref 0.00–0.07)
Basophils Absolute: 0 10*3/uL (ref 0.0–0.1)
Basophils Relative: 1 %
Eosinophils Absolute: 0.1 10*3/uL (ref 0.0–0.5)
Eosinophils Relative: 1 %
HCT: 58.8 % — ABNORMAL HIGH (ref 36.0–46.0)
Hemoglobin: 18.9 g/dL — ABNORMAL HIGH (ref 12.0–15.0)
Immature Granulocytes: 0 %
Lymphocytes Relative: 28 %
Lymphs Abs: 2.1 10*3/uL (ref 0.7–4.0)
MCH: 31.4 pg (ref 26.0–34.0)
MCHC: 32.1 g/dL (ref 30.0–36.0)
MCV: 97.8 fL (ref 80.0–100.0)
Monocytes Absolute: 0.7 10*3/uL (ref 0.1–1.0)
Monocytes Relative: 10 %
Neutro Abs: 4.4 10*3/uL (ref 1.7–7.7)
Neutrophils Relative %: 60 %
Platelets: 160 10*3/uL (ref 150–400)
RBC: 6.01 MIL/uL — ABNORMAL HIGH (ref 3.87–5.11)
RDW: 14.8 % (ref 11.5–15.5)
WBC: 7.4 10*3/uL (ref 4.0–10.5)
nRBC: 0 % (ref 0.0–0.2)

## 2020-09-03 ENCOUNTER — Ambulatory Visit: Payer: BC Managed Care – PPO | Admitting: Professional

## 2020-09-04 ENCOUNTER — Ambulatory Visit (INDEPENDENT_AMBULATORY_CARE_PROVIDER_SITE_OTHER): Payer: BC Managed Care – PPO | Admitting: Professional

## 2020-09-04 DIAGNOSIS — F411 Generalized anxiety disorder: Secondary | ICD-10-CM

## 2020-09-12 ENCOUNTER — Ambulatory Visit (INDEPENDENT_AMBULATORY_CARE_PROVIDER_SITE_OTHER): Payer: BC Managed Care – PPO | Admitting: Professional

## 2020-09-12 DIAGNOSIS — F411 Generalized anxiety disorder: Secondary | ICD-10-CM

## 2020-09-23 ENCOUNTER — Ambulatory Visit (INDEPENDENT_AMBULATORY_CARE_PROVIDER_SITE_OTHER): Payer: BC Managed Care – PPO | Admitting: Professional

## 2020-09-23 DIAGNOSIS — F411 Generalized anxiety disorder: Secondary | ICD-10-CM

## 2020-09-25 ENCOUNTER — Ambulatory Visit: Payer: BC Managed Care – PPO | Admitting: Professional

## 2020-10-02 ENCOUNTER — Ambulatory Visit (INDEPENDENT_AMBULATORY_CARE_PROVIDER_SITE_OTHER): Payer: BC Managed Care – PPO | Admitting: Professional

## 2020-10-02 DIAGNOSIS — F411 Generalized anxiety disorder: Secondary | ICD-10-CM

## 2020-10-09 ENCOUNTER — Other Ambulatory Visit: Payer: Self-pay

## 2020-10-09 ENCOUNTER — Ambulatory Visit (INDEPENDENT_AMBULATORY_CARE_PROVIDER_SITE_OTHER): Payer: BC Managed Care – PPO | Admitting: Professional

## 2020-10-09 ENCOUNTER — Ambulatory Visit (INDEPENDENT_AMBULATORY_CARE_PROVIDER_SITE_OTHER): Payer: BC Managed Care – PPO | Admitting: Allergy & Immunology

## 2020-10-09 ENCOUNTER — Encounter: Payer: Self-pay | Admitting: Allergy & Immunology

## 2020-10-09 VITALS — BP 132/90 | HR 86 | Resp 18

## 2020-10-09 DIAGNOSIS — J302 Other seasonal allergic rhinitis: Secondary | ICD-10-CM

## 2020-10-09 DIAGNOSIS — T782XXD Anaphylactic shock, unspecified, subsequent encounter: Secondary | ICD-10-CM | POA: Diagnosis not present

## 2020-10-09 DIAGNOSIS — J4540 Moderate persistent asthma, uncomplicated: Secondary | ICD-10-CM | POA: Diagnosis not present

## 2020-10-09 DIAGNOSIS — F411 Generalized anxiety disorder: Secondary | ICD-10-CM

## 2020-10-09 DIAGNOSIS — Z720 Tobacco use: Secondary | ICD-10-CM

## 2020-10-09 DIAGNOSIS — J3089 Other allergic rhinitis: Secondary | ICD-10-CM

## 2020-10-09 NOTE — Patient Instructions (Addendum)
1. Perennial and seasonal allergic rhinitis (dust mites, cat, dog, roach, trees, ragweed) - We will continue to hold shots for now since you were having such large local reactions. - Continue Allegra 180mg  once a day for a runny nose or itch - Continue Flonase 2 sprays in each nostril once a day for a stuffy nose - Continue with Astelin two sprays per nostril up to twice daily on the worst days.  - Continue saline nasal rinses, to be used before fluticasone.    2. ? asthma  - looks great today.  - I think if anything you had allergic asthma.  - Daily controller medication(s): NONE - Prior to physical activity: albuterol 2 puffs 10-15 minutes before physical activity. - Rescue medications: albuterol 4 puffs every 4-6 hours as needed - Asthma control goals:  * Full participation in all desired activities (may need albuterol before activity) * Albuterol use two time or less a week on average (not counting use with activity) * Cough interfering with sleep two time or less a month * Oral steroids no more than once a year * No hospitalizations  3. Multiple food allergies (cow's milk, nuts, red meat, shellfish) - We are going to rest the cow's milk allergy and see where those levels are hanging out.  - We will MyChart you with the results. - Continue to avoid red meat, nuts, and shellfish.  4. Return in about 6 months (around 04/09/2021).    Please inform 04/11/2021 of any Emergency Department visits, hospitalizations, or changes in symptoms. Call us before going to the ED for breathing or allergy symptoms since we might be able to fit you in for a sick visit. Feel free to contact us anytime with any questions, problems, or concerns.  It was a pleasure to see you again today!  Websites that have reliable patient information: 1. American Academy of Asthma, Allergy, and Immunology: www.aaaai.org 2. Food Allergy Research and Education (FARE): foodallergy.org 3. Mothers of Asthmatics:  http://www.asthmacommunitynetwork.org 4. American College of Allergy, Asthma, and Immunology: www.acaai.org   COVID-19 Vaccine Information can be found at: [place] For questions related to vaccine distribution or appointments, please email vaccine@ [email or web address] or call [phone].     "Like" [phone] on Facebook and Instagram for our latest updates!     HAPPY FALL!     Make sure you are registered to vote! If you have moved or changed any of your contact information, you will need to get this updated before voting!  In some cases, you MAY be able to register to vote online: [place]

## 2020-10-09 NOTE — Progress Notes (Signed)
FOLLOW UP  Date of Service/Encounter:  10/09/20   Assessment:   Seasonal and perennial allergic rhinitis(dust mites, cat, dog, roach, trees, and ragweed) - seems somewhat well controlled with the use of medications alone  Intermittent episodes of generalized malaise/fatigue for days following her allergy injections (despite antihistamines and prednisone) - improved with stopping the allergy shots  Multiple food allergies versus sensitizations - repeating cow's milk IgE and component testing today  Moderate persistent asthma, uncomplicated  Current smoker - interested in quitting  Erythrocytosis (JAK2 negative)  Fully vaccinated to COVID19 Gala Mur[place])   Plan/Recommendations:   1. Perennial and seasonal allergic rhinitis (dust mites, cat, dog, roach, trees, ragweed) - We will continue to hold shots for now since you were having such large local reactions. - Continue Allegra 180mg  once a day for a runny nose or itch - Continue Flonase 2 sprays in each nostril once a day for a stuffy nose - Continue with Astelin two sprays per nostril up to twice daily on the worst days.  - Continue saline nasal rinses, to be used before fluticasone.    2. ? asthma  - looks great today.  - I think if anything you had allergic asthma.  - Daily controller medication(s): NONE - Prior to physical activity: albuterol 2 puffs 10-15 minutes before physical activity. - Rescue medications: albuterol 4 puffs every 4-6 hours as needed - Asthma control goals:  * Full participation in all desired activities (may need albuterol before activity) * Albuterol use two time or less a week on average (not counting use with activity) * Cough interfering with sleep two time or less a month * Oral steroids no more than once a year * No hospitalizations  3. Multiple food allergies (cow's milk, nuts, red meat, shellfish) - We are going to rest the cow's milk allergy and see where those levels are  hanging out.  - We will MyChart you with the results. - Continue to avoid red meat, nuts, and shellfish.  4. Return in about 6 months (around 04/09/2021).   Subjective:   Lindsey Jensen is a 34 y.o. female presenting today for follow up of  Chief Complaint  Patient presents with  . Asthma    doing better. no problems.   . Allergies    doing fine since stopping shots, but she has not been around any cats.     Lindsey Jensen has a history of the following: Patient Active Problem List   Diagnosis Date Noted  . Anxiety 08/28/2020  . Other fatigue 06/11/2020  . Multiple allergies 06/11/2020  . Erythrocytosis 01/25/2020  . 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 Seasonal and perennial allergic rhinitis 05/19/2019  . Moderate persistent asthma, uncomplicated 05/19/2019  . Tobacco use 10/07/2017    History obtained from: chart review and patient.  Onya is a 34 y.o. female presenting for a follow up visit.  She was last seen in August 2021.  At that time, we discussed her issues with her allergy shots via televisit.  She reported that she was feeling fatigued for several days after the allergy shots as well as experiencing large local reactions.  She was also reporting blurry eyes, dizziness, and vertigo.  After this discussion, she decided to just go ahead and stop her allergy shots to see how she did without them.  Since last visit, she has done well.  She seems to be a very good place now.  She started getting therapy, which she is doing  once a week.  This seems to be helping.  She is not on any antidepressants or antianxiety medications, but is instead just trying the therapy first.  She knows that her anxiety is playing into a lot of her symptoms.  She does report today that her breathing is "off".  Her pulse ox is always normal. The Xopenex always helps. She does use the Qvar in the spring and the fall. She does feel her throat getting scratchy when she around people doing yard work.  She will  occasionally wear mask outdoors when she is doing some yard work.  She continues to work on stopping smoking.  Evidently, she has patches on top of her fridge.  I did tell her today that she actually has to put them on her skin for them to work, in [employer] of course.  Apparently there is a thought that the smoking is contributing to her erythrocytosis.  She has had a very thorough work-up for this and is still followed by hematology.  She is using all of her allergy medicines.  She has not felt particularly worse when off of the shots.  She does feel better not having the protracted post shot symptoms.  She is fully vaccinated with Moderna.  She will be getting the booster when it is officially approved.  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  She does not particularly like her coworkers, so she tries to stay at home as much as she can.  Evidently, there is a large contingent antibacterials at her workplace and she is hopeful that the looming vaccine mandate will for some of them out.  She continues to avoid all of her triggering foods.  She would like to get dairy back into her diet, and is open to retesting for that.  I originally did the food allergy testing out of quite a bit of arm twisting.  I did provide her with the limitations of food allergy testing and warned her of the risk of false positives.  She is avoiding peanuts, tree nuts, mammalian meats, shellfish, and cows milk. The milk is the biggest issue for her and she would like to introduce this into her diet.  Otherwise, there have been no changes to her past medical history, surgical history, family history, or social history.    Review of Systems  Constitutional: Negative.  Negative for chills, fever, malaise/fatigue and weight loss.  HENT: Positive for congestion. Negative for ear discharge, ear pain and sinus pain.   Eyes: Negative for pain, discharge and redness.  Respiratory: Positive  for shortness of breath. Negative for cough, sputum production and wheezing.   Cardiovascular: Negative.  Negative for chest pain and palpitations.  Gastrointestinal: Negative for abdominal pain, constipation, diarrhea, heartburn, nausea and vomiting.  Skin: Negative.  Negative for itching and rash.  Neurological: Negative for dizziness and headaches.  Endo/Heme/Allergies: Positive for environmental allergies. Does not bruise/bleed easily.  Psychiatric/Behavioral: The patient is nervous/anxious.        Objective:   Blood pressure 132/90, pulse 86, resp. rate 18, SpO2 98 %. There is no height or weight on file to calculate BMI.   Physical Exam:  Physical Exam Constitutional:      Appearance: She is well-developed.     Comments: She seems very chilled, certainly more days than her previous visits.  HENT:     Head: Normocephalic and atraumatic.     Right Ear: Tympanic membrane, ear canal  and external ear normal.     Left Ear: Tympanic membrane, ear canal and external ear normal.     Nose: No nasal deformity, septal deviation, mucosal edema or rhinorrhea.     Right Turbinates: Enlarged and swollen.     Left Turbinates: Enlarged and swollen.     Right Sinus: No maxillary sinus tenderness or frontal sinus tenderness.     Left Sinus: No maxillary sinus tenderness or frontal sinus tenderness.     Comments: There is some clear rhinorrhea bilaterally.  No sinus tenderness.    Mouth/Throat:     Mouth: Mucous membranes are not pale and not dry.     Pharynx: Uvula midline.  Eyes:     General:        Right eye: No discharge.        Left eye: No discharge.     Conjunctiva/sclera: Conjunctivae normal.     Right eye: Right conjunctiva is not injected. No chemosis.    Left eye: Left conjunctiva is not injected. No chemosis.    Pupils: Pupils are equal, round, and reactive to light.  Cardiovascular:     Rate and Rhythm: Normal rate and regular rhythm.     Heart sounds: Normal heart sounds.   Pulmonary:     Effort: Pulmonary effort is normal. No tachypnea, accessory muscle usage or respiratory distress.     Breath sounds: Normal breath sounds. No wheezing, rhonchi or rales.  Chest:     Chest wall: No tenderness.  Lymphadenopathy:     Cervical: No cervical adenopathy.  Skin:    Coloration: Skin is not pale.     Findings: No abrasion, erythema, petechiae or rash. Rash is not papular, urticarial or vesicular.  Neurological:     Mental Status: She is alert.  Psychiatric:        Behavior: Behavior is cooperative.      Diagnostic studies:    Spirometry: results normal (FEV1: 3.68/103%, FVC: 4.03/94%, FEV1/FVC: 91%).    Spirometry consistent with normal pattern.   Allergy Studies: none        Malachi Bonds, MD  [facility]

## 2020-10-10 ENCOUNTER — Encounter: Payer: Self-pay | Admitting: Allergy & Immunology

## 2020-10-16 ENCOUNTER — Ambulatory Visit (INDEPENDENT_AMBULATORY_CARE_PROVIDER_SITE_OTHER): Payer: BC Managed Care – PPO | Admitting: Professional

## 2020-10-16 DIAGNOSIS — F411 Generalized anxiety disorder: Secondary | ICD-10-CM

## 2020-10-23 ENCOUNTER — Ambulatory Visit (INDEPENDENT_AMBULATORY_CARE_PROVIDER_SITE_OTHER): Payer: BC Managed Care – PPO | Admitting: Professional

## 2020-10-23 DIAGNOSIS — F411 Generalized anxiety disorder: Secondary | ICD-10-CM | POA: Diagnosis not present

## 2020-10-27 IMAGING — DX DG CHEST 2V
2 series · 2 of 2 positions shown · non-contrast
Comparison: 10/06/2018

CLINICAL DATA: Cough congestion chest pain

EXAM:
CHEST - 2 VIEW

[w chest pa]
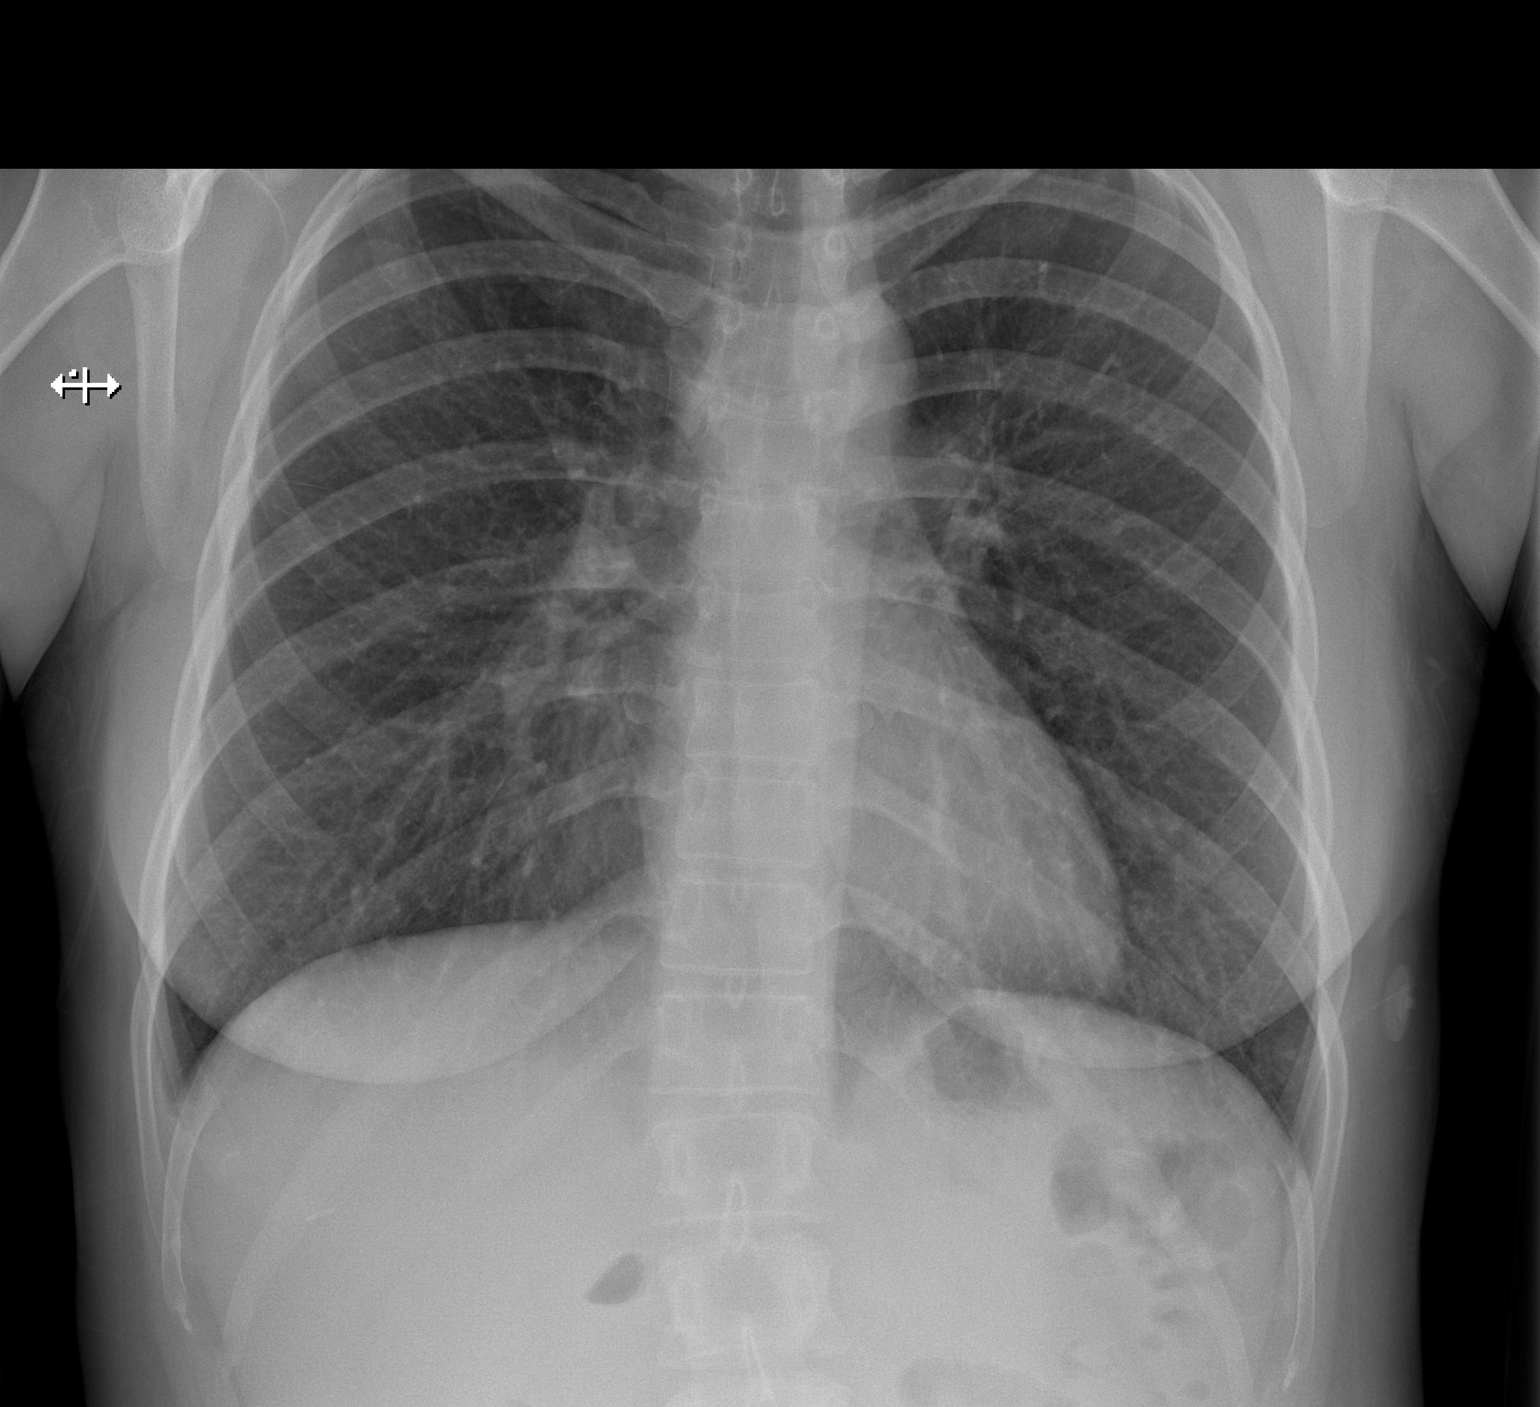

[w chest lat]
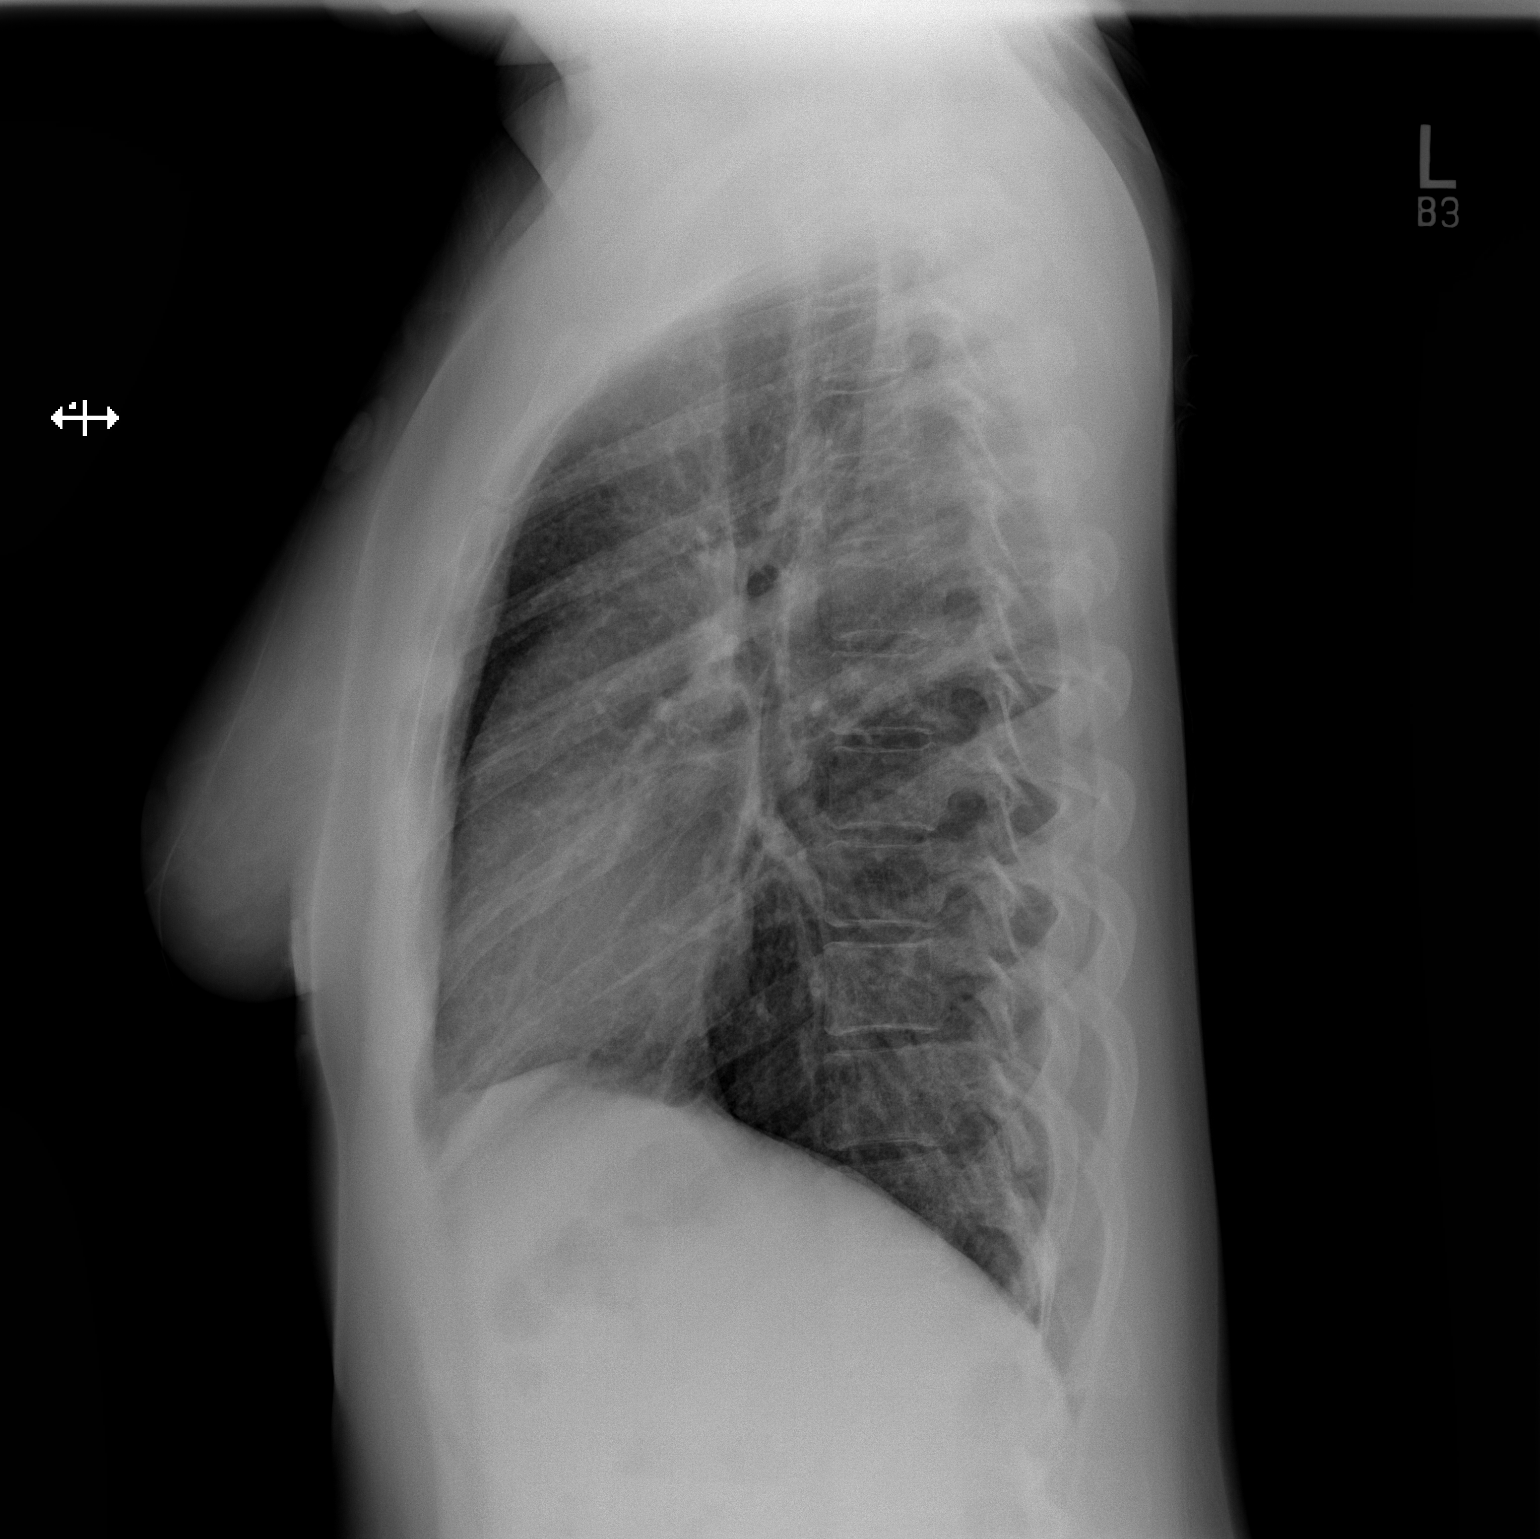

[2 of 2 positions shown; findings below may reference images not displayed]

FINDINGS: The heart size and mediastinal contours are within normal limits.
Both lungs are clear. The visualized skeletal structures are
unremarkable.
IMPRESSION: No active cardiopulmonary disease.

## 2020-11-06 ENCOUNTER — Ambulatory Visit (INDEPENDENT_AMBULATORY_CARE_PROVIDER_SITE_OTHER): Payer: BC Managed Care – PPO | Admitting: Professional

## 2020-11-06 ENCOUNTER — Encounter (HOSPITAL_COMMUNITY): Payer: Self-pay

## 2020-11-06 DIAGNOSIS — F411 Generalized anxiety disorder: Secondary | ICD-10-CM

## 2020-11-07 ENCOUNTER — Other Ambulatory Visit (HOSPITAL_COMMUNITY): Payer: Self-pay

## 2020-11-07 DIAGNOSIS — D751 Secondary polycythemia: Secondary | ICD-10-CM

## 2020-11-08 ENCOUNTER — Other Ambulatory Visit: Payer: Self-pay

## 2020-11-08 ENCOUNTER — Inpatient Hospital Stay (HOSPITAL_COMMUNITY): Payer: BC Managed Care – PPO | Attending: Hematology

## 2020-11-08 DIAGNOSIS — F17210 Nicotine dependence, cigarettes, uncomplicated: Secondary | ICD-10-CM | POA: Insufficient documentation

## 2020-11-08 DIAGNOSIS — D751 Secondary polycythemia: Secondary | ICD-10-CM | POA: Diagnosis not present

## 2020-11-08 LAB — IRON AND TIBC
Iron: 57 ug/dL (ref 28–170)
Saturation Ratios: 10 % — ABNORMAL LOW (ref 10.4–31.8)
TIBC: 549 ug/dL — ABNORMAL HIGH (ref 250–450)
UIBC: 492 ug/dL

## 2020-11-08 LAB — FERRITIN: Ferritin: 12 ng/mL (ref 11–307)

## 2020-11-13 DIAGNOSIS — T782XXD Anaphylactic shock, unspecified, subsequent encounter: Secondary | ICD-10-CM | POA: Diagnosis not present

## 2020-11-18 LAB — IGE MILK W/ COMPONENT REFLEX: F002-IgE Milk: 3.05 kU/L — AB

## 2020-11-18 LAB — ALLERGEN COMPONENT COMMENTS

## 2020-11-18 LAB — PANEL 603848
F076-IgE Alpha Lactalbumin: <0.1 kU/L
F077-IgE Beta Lactoglobulin: <0.1 kU/L
F078-IgE Casein: 0.11 kU/L — AB

## 2020-11-19 ENCOUNTER — Encounter: Payer: Self-pay | Admitting: Family Medicine

## 2020-11-19 ENCOUNTER — Other Ambulatory Visit: Payer: Self-pay

## 2020-11-19 ENCOUNTER — Ambulatory Visit (INDEPENDENT_AMBULATORY_CARE_PROVIDER_SITE_OTHER): Payer: BC Managed Care – PPO | Admitting: Family Medicine

## 2020-11-19 VITALS — Ht 68.0 in | Wt 166.0 lb

## 2020-11-19 DIAGNOSIS — J069 Acute upper respiratory infection, unspecified: Secondary | ICD-10-CM | POA: Diagnosis not present

## 2020-11-19 DIAGNOSIS — J0140 Acute pansinusitis, unspecified: Secondary | ICD-10-CM

## 2020-11-19 NOTE — Patient Instructions (Signed)
I appreciate the opportunity to provide you with care for your health and wellness. Today we discussed: viral cold symptoms  Follow up: 04/29/2021 as scheduled or as needed  No labs or referrals today  Please get COVID testing- if positive call us back  If negative and still feeling poorly 7 days from onset of your symptoms please call back  Start using the humidifier at night   Continue all your current medications as directed   Please continue to practice social distancing to keep you, your family, and our community safe.  If you must go out, please wear a mask and practice good handwashing.  It was a pleasure to see you and I look forward to continuing to work together on your health and well-being. Please do not hesitate to call the office if you need care or have questions about your care.  Have a wonderful day and week. With Gratitude, Tereasa Coop, DNP, AGNP-BC

## 2020-11-19 NOTE — Assessment & Plan Note (Signed)
At this time we will treat as if viral in nature versus allergy. Encouraged the use of a humidifier, current allergy medications. She declined wanting any cough syrup at this time. Reports that she will continue taking medications that she is currently on. Advised to get a Covid test. And to follow-up within 48 hours roughly Friday morning if not feeling any better.

## 2020-11-19 NOTE — Progress Notes (Addendum)
Virtual Visit via Telephone Note   This visit type was conducted due to national recommendations for restrictions regarding the COVID-19 Pandemic (e.g. social distancing) in an effort to limit this patient's exposure and mitigate transmission in our community.  Due to her co-morbid illnesses, this patient is at least at moderate risk for complications without adequate follow up.  This format is felt to be most appropriate for this patient at this time.  The patient did not have access to video technology/had technical difficulties with video requiring transitioning to audio format only (telephone).  All issues noted in this document were discussed and addressed.  No physical exam could be performed with this format.    Evaluation Performed:  Follow-up visit  Date:  11/26/2020   ID:  Lindsey Jensen, DOB Nov 25, 1986, MRN [id number]  Patient Location: Home Provider Location: Office/Clinic  Participants include nurse for intake, patient, provider for visit  Location of Patient: Home Location of Provider: Telehealth Consent was obtain for visit to be over via telehealth. I verified that I am speaking with the correct person using two identifiers.  PCP:  Freddy Finner, NP   Chief Complaint: Congestion  History of Present Illness:    Lindsey Jensen is a 34 y.o. female with history of 4 days of congestion. Reports some pressure in the facial area as well. She reports that Friday morning when she woke up she was kind of feeling poorly when out to go shopping with her son by the time she got to the first door she noted that she just was not feeling well at all and decided to go home. Over the weekend she try to do some self-care. She has a history of allergies she is been taking her allergy medication as directed. She reports that she did have a sore throat over the weekend but that is kind of improved since Sunday early Monday. She reports a little bit of improvement over the weekend but then since  Sunday she is kind had a holding pattern. She reports that she cannot get her saline rinse into her nose because she feels like it is super congested. She reports clear drainage. She denies having any chest congestion or excessive coughing. Is already using Flonase regularly. Reports her son had an ear infection but nothing viral. Denies having any recent sick contacts. Has not had a recent Covid test. She denies having any fevers or chills. But does report that she had some sensations of feeling hot and sweaty last night. She denies current sore throat, cough, itchy nose or throat. Does endorse some sneezing. No itchy watery eyes. Maybe some mild muscle aches in her back and shoulder she reports. Denies having headache.   One week later- patient is still sick. Having a lot of pressure in her face and head. Increased drainage that is now green and yellow. Will send in medication for this.    The patient does not have symptoms concerning for COVID-19 infection (fever, chills, cough, or new shortness of breath).   Past Medical, Surgical, Social History, Allergies, and Medications have been Reviewed.  Past Medical History:  Diagnosis Date  . Allergy    Phreesia 05/26/2020  . Anxiety    Phreesia 05/26/2020  . Asthma    Phreesia 05/26/2020  . Contraceptive education 01/26/2014  . Erythrocytosis   . Medical history non-contributory   . Moderate persistent asthma, uncomplicated 05/19/2019   Past Surgical History:  Procedure Laterality Date  . NO PAST  SURGERIES       Current Meds  Medication Sig  . azelastine (ASTELIN) 0.1 % nasal spray Place 2 sprays into both nostrils 2 (two) times daily.  . Cholecalciferol (VITAMIN D) 50 MCG (2000 UT) tablet Take 2,000 Units by mouth daily.  Marland Kitchen EPINEPHrine (EPIPEN 2-PAK) 0.3 mg/0.3 mL IJ SOAJ injection Use as directed for severe allergic reaction  . fexofenadine (ALLEGRA) 180 MG tablet Take 180 mg by mouth daily.  . fluticasone (FLONASE) 50 MCG/ACT nasal  spray Place 2 sprays into both nostrils daily.  Marland Kitchen levalbuterol (XOPENEX HFA) 45 MCG/ACT inhaler Inhale 1-2 puffs into the lungs every 4 (four) hours as needed for wheezing.     Allergies:   Beef-derived products, Dairy aid [lactase], Meat [alpha-gal], Pork-derived products, Shellfish allergy, and Other   ROS:   Please see the history of present illness.    All other systems reviewed and are negative.   Labs/Other Tests and Data Reviewed:    Recent Labs: 06/21/2020: Magnesium 2.2; TSH 0.904 08/30/2020: ALT 17; BUN 15; Creatinine, Ser 0.89; Hemoglobin 18.9; Platelets 160; Potassium 4.1; Sodium 136   Recent Lipid Panel No results found for: CHOL, TRIG, HDL, CHOLHDL, LDLCALC, LDLDIRECT  Wt Readings from Last 3 Encounters:  11/19/20 166 lb (75.3 kg)  08/28/20 163 lb (73.9 kg)  07/18/20 163 lb (73.9 kg)     Objective:    Vital Signs:  Ht 5\' 8"  (1.727 m)   Wt 166 lb (75.3 kg)   BMI 25.24 kg/m    VITAL SIGNS:  reviewed GEN:  no acute distress RESPIRATORY:  No shortness of breath noted in conversation PSYCH:  normal affect  ASSESSMENT & PLAN:    1. Acute non-recurrent pansinusitis  zpak provided 1 week post appt due to increase and on going symptoms   Time:   Today, I have spent 5 minutes with the patient with telehealth technology discussing the above problems.     Medication Adjustments/Labs and Tests Ordered: Current medicines are reviewed at length with the patient today.  Concerns regarding medicines are outlined above.   Tests Ordered: No orders of the defined types were placed in this encounter.   Medication Changes: Meds ordered this encounter  Medications  . azithromycin (ZITHROMAX) 250 MG tablet    Sig: Take 2 tablets (500 mg) on the first day. Then take 1 tablet (250mg ) on days 2-5.    Dispense:  6 tablet    Refill:  0    Order Specific Question:   Supervising Provider    Answer:   E [[id number]]     Append: One week later- patient is  still sick. Having a lot of pressure in her face and head. Increased drainage that is now green and yellow. Will send in medication for this.    Disposition:  Follow up 04/29/2021 Signed, Syliva Overman, NP  11/26/2020 4:02 PM     [facility]dy Finner [facility]

## 2020-11-26 ENCOUNTER — Encounter: Payer: Self-pay | Admitting: Family Medicine

## 2020-11-26 ENCOUNTER — Other Ambulatory Visit: Payer: Self-pay | Admitting: Family Medicine

## 2020-11-26 MED ORDER — AZITHROMYCIN 250 MG PO TABS: ORAL_TABLET | ORAL | 0 refills | Status: DC

## 2020-11-26 NOTE — Telephone Encounter (Signed)
Left message

## 2020-11-26 NOTE — Addendum Note (Signed)
Addended by: Freddy Finner on: 11/26/2020 04:02 PM   Modules accepted: Orders

## 2020-11-26 NOTE — Telephone Encounter (Signed)
She does not have these noted in her allergies section of the chart- please ask the side effects and document them so we know this for future reference. I will send in a zpak. Thank you for follow up.

## 2020-11-26 NOTE — Telephone Encounter (Signed)
Since it has been a week- I think she would benefit from an antibiotic- if she wants- let me know. Also since it has only been a week since appt- I will attach the medication append to that note.

## 2020-11-27 ENCOUNTER — Ambulatory Visit (INDEPENDENT_AMBULATORY_CARE_PROVIDER_SITE_OTHER): Payer: BC Managed Care – PPO | Admitting: Professional

## 2020-11-27 DIAGNOSIS — F411 Generalized anxiety disorder: Secondary | ICD-10-CM

## 2020-12-11 ENCOUNTER — Ambulatory Visit: Payer: BC Managed Care – PPO | Admitting: Professional

## 2020-12-24 ENCOUNTER — Other Ambulatory Visit: Payer: Self-pay | Admitting: Allergy & Immunology

## 2020-12-26 ENCOUNTER — Ambulatory Visit: Payer: BC Managed Care – PPO | Admitting: Professional

## 2021-01-09 ENCOUNTER — Other Ambulatory Visit: Payer: Self-pay | Admitting: Allergy & Immunology

## 2021-01-30 ENCOUNTER — Inpatient Hospital Stay (HOSPITAL_COMMUNITY): Payer: BC Managed Care – PPO

## 2021-01-30 ENCOUNTER — Inpatient Hospital Stay (HOSPITAL_COMMUNITY): Payer: BC Managed Care – PPO | Attending: Hematology | Admitting: Hematology

## 2021-01-30 ENCOUNTER — Other Ambulatory Visit: Payer: Self-pay

## 2021-01-30 VITALS — BP 149/97 | HR 84 | Temp 97.3°F | Resp 18 | Wt 178.4 lb

## 2021-01-30 DIAGNOSIS — R531 Weakness: Secondary | ICD-10-CM | POA: Diagnosis not present

## 2021-01-30 DIAGNOSIS — I7381 Erythromelalgia: Secondary | ICD-10-CM | POA: Insufficient documentation

## 2021-01-30 DIAGNOSIS — Z79899 Other long term (current) drug therapy: Secondary | ICD-10-CM | POA: Diagnosis not present

## 2021-01-30 DIAGNOSIS — D751 Secondary polycythemia: Secondary | ICD-10-CM

## 2021-01-30 DIAGNOSIS — F17210 Nicotine dependence, cigarettes, uncomplicated: Secondary | ICD-10-CM | POA: Diagnosis not present

## 2021-01-30 LAB — CBC WITH DIFFERENTIAL/PLATELET
Abs Immature Granulocytes: 0.01 10*3/uL (ref 0.00–0.07)
Basophils Absolute: 0.1 10*3/uL (ref 0.0–0.1)
Basophils Relative: 1 %
Eosinophils Absolute: 0.2 10*3/uL (ref 0.0–0.5)
Eosinophils Relative: 3 %
HCT: 60.2 % — ABNORMAL HIGH (ref 36.0–46.0)
Hemoglobin: 19.5 g/dL — ABNORMAL HIGH (ref 12.0–15.0)
Immature Granulocytes: 0 %
Lymphocytes Relative: 27 %
Lymphs Abs: 1.7 10*3/uL (ref 0.7–4.0)
MCH: 33.1 pg (ref 26.0–34.0)
MCHC: 32.4 g/dL (ref 30.0–36.0)
MCV: 102.2 fL — ABNORMAL HIGH (ref 80.0–100.0)
Monocytes Absolute: 0.6 10*3/uL (ref 0.1–1.0)
Monocytes Relative: 10 %
Neutro Abs: 3.9 10*3/uL (ref 1.7–7.7)
Neutrophils Relative %: 59 %
Platelets: 120 10*3/uL — ABNORMAL LOW (ref 150–400)
RBC: 5.89 MIL/uL — ABNORMAL HIGH (ref 3.87–5.11)
RDW: 15.8 % — ABNORMAL HIGH (ref 11.5–15.5)
WBC: 6.4 10*3/uL (ref 4.0–10.5)
nRBC: 0 % (ref 0.0–0.2)

## 2021-01-30 LAB — COMPREHENSIVE METABOLIC PANEL WITH GFR
ALT: 17 U/L (ref 0–44)
AST: 16 U/L (ref 15–41)
Albumin: 3.8 g/dL (ref 3.5–5.0)
Alkaline Phosphatase: 57 U/L (ref 38–126)
Anion gap: 8 (ref 5–15)
BUN: 14 mg/dL (ref 6–20)
CO2: 29 mmol/L (ref 22–32)
Calcium: 9.1 mg/dL (ref 8.9–10.3)
Chloride: 99 mmol/L (ref 98–111)
Creatinine, Ser: 0.66 mg/dL (ref 0.44–1.00)
GFR, Estimated: >60 mL/min
Glucose, Bld: 91 mg/dL (ref 70–99)
Potassium: 4.3 mmol/L (ref 3.5–5.1)
Sodium: 136 mmol/L (ref 135–145)
Total Bilirubin: 0.8 mg/dL (ref 0.3–1.2)
Total Protein: 6.9 g/dL (ref 6.5–8.1)

## 2021-01-30 NOTE — Progress Notes (Signed)
[facility] [place], [place] [address]   CLINIC:  Medical Oncology/Hematology  PCP:  Perlie Mayo, NP [address] / [place] [address]  [phone]  REASON FOR VISIT:  Follow-up for erythrocytosis  PRIOR THERAPY: None  CURRENT THERAPY: Phlebotomy every 6 weeks  INTERVAL HISTORY:  [name], a 35 y.o. female, returns for routine follow-up for her erythrocytosis. Lindsey Jensen was last seen on 07/18/2020.  Today she reports feeling well. Her hands have improved and she denies aquagenic pruritis or rash, though her hands continue staying red. She had 2 phlebotomies in 2021 and has been feeling well otherwise; she goes to [facility] to donate her blood and reports having weakness lasting several days after having phlebotomy. She denies having headaches or vision changes. She stopped getting her allergy shots in 2021. She finished her Z-Pack in 11/2020 and was infected with COVID in the first 2 weeks of January. Her issues started shortly after childbirth in 2019. She has never had any MI's.   She continues smoking 3/4 PPD and has cut down on her caffeine consumption; she continues drinking 6 bottles of water daily.   REVIEW OF SYSTEMS:  Review of Systems  Constitutional: Positive for fatigue (90%). Negative for appetite change.  Eyes: Negative for eye problems.  Skin: Negative for itching and rash.  Neurological: Negative for headaches.  All other systems reviewed and are negative.   PAST MEDICAL/SURGICAL HISTORY:  Past Medical History:  Diagnosis Date  . Allergy    Phreesia 05/26/2020  . Anxiety    Phreesia 05/26/2020  . Asthma    Phreesia 05/26/2020  . Contraceptive education 01/26/2014  . Erythrocytosis   . Medical history non-contributory   . Moderate persistent asthma, uncomplicated 08/07/4036   Past Surgical History:  Procedure Laterality Date  . NO PAST SURGERIES      SOCIAL HISTORY:  Social History   Socioeconomic History  .  Marital status: Married    Spouse name: Not on file  . Number of children: 1  . Years of education: Not on file  . Highest education level: Bachelor's degree (e.g., BA, AB, BS)  Occupational History    Employer: Building surveyor  Tobacco Use  . Smoking status: Current Every Day Smoker    Packs/day: 0.50    Years: 14.00    Pack years: 7.00    Types: Cigarettes  . Smokeless tobacco: Never Used  Vaping Use  . Vaping Use: Never used  Substance and Sexual Activity  . Alcohol use: Yes    Alcohol/week: 14.0 standard drinks    Types: 14 Glasses of wine per week    Comment: glass of wine every night    . Drug use: No  . Sexual activity: Yes    Birth control/protection: None  Other Topics Concern  . Not on file  Social History Narrative   Lives with husband and son      Dog: Pitbull: Lana       Enjoys: reading-reformed theology; fiction; drink wine, buy shoes, playing cards      Diet: eats all food groups outside of meat-some chicken at times   Caffeine: 3 cups of expresso daily    Water: 24 oz x 3 bottles daily       Wears seat belt   Does not use phone while driving   Art therapist in a lock box   Social Determinants of Engineer, drilling  Resource Strain: Low Risk   . Difficulty of Paying Living Expenses: Not hard at all  Food Insecurity: No Food Insecurity  . Worried About Charity fundraiser in the Last Year: Never true  . Ran Out of Food in the Last Year: Never true  Transportation Needs: No Transportation Needs  . Lack of Transportation (Medical): No  . Lack of Transportation (Non-Medical): No  Physical Activity: Inactive  . Days of Exercise per Week: 0 days  . Minutes of Exercise per Session: 0 min  Stress: Stress Concern Present  . Feeling of Stress : To some extent  Social Connections: Moderately Isolated  . Frequency of Communication with Friends and Family: More than three times a week  . Frequency of Social Gatherings with  Friends and Family: More than three times a week  . Attends Religious Services: Never  . Active Member of Clubs or Organizations: No  . Attends Archivist Meetings: Never  . Marital Status: Married  Human resources officer Violence: Not At Risk  . Fear of Current or Ex-Partner: No  . Emotionally Abused: No  . Physically Abused: No  . Sexually Abused: No    FAMILY HISTORY:  Family History  Problem Relation Age of Onset  . Heart disease Father        heart attack  . Allergic rhinitis Father   . Diabetes Mother   . Depression Mother   . Anxiety disorder Mother   . Heart disease Mother   . Heart disease Maternal Grandmother        CHF  . Asthma Maternal Grandmother   . Diabetes Maternal Grandmother   . High Cholesterol Sister   . Kidney Stones Brother   . Alzheimer's disease Maternal Grandfather   . Asthma Maternal Grandfather   . Diabetes Maternal Uncle   . Angioedema Neg Hx   . Immunodeficiency Neg Hx     CURRENT MEDICATIONS:  Current Outpatient Medications  Medication Sig Dispense Refill  . azelastine (ASTELIN) 0.1 % nasal spray Place 2 sprays into both nostrils 2 (two) times daily. 30 mL 5  . Cholecalciferol (VITAMIN D) 50 MCG (2000 UT) tablet Take 2,000 Units by mouth daily.    Marland Kitchen EPINEPHrine (EPIPEN 2-PAK) 0.3 mg/0.3 mL IJ SOAJ injection Use as directed for severe allergic reaction 2 each 2  . fexofenadine (ALLEGRA) 180 MG tablet Take 180 mg by mouth daily.    . fluticasone (FLONASE) 50 MCG/ACT nasal spray Place 2 sprays into both nostrils daily. 16 g 5  . levalbuterol (XOPENEX HFA) 45 MCG/ACT inhaler TAKE 2 PUFFS BY MOUTH EVERY 6 HOURS AS NEEDED FOR WHEEZE OR SHORTNESS OF BREATH 15 each 12  . azithromycin (ZITHROMAX) 250 MG tablet Take 2 tablets (500 mg) on the first day. Then take 1 tablet (242m) on days 2-5. 6 tablet 0   No current facility-administered medications for this visit.    ALLERGIES:  Allergies  Allergen Reactions  . Beef-Derived Products  Anaphylaxis    Alpha-Gal Allergy   . Dairy Aid [Lactase] Other (See Comments)    SOB  . Meat [Alpha-Gal] Palpitations    All mammal. Symptom asthma .  .Marland KitchenPork-Derived Products Anaphylaxis    Alpha-Gal Allergy  . Shellfish Allergy Other (See Comments)    SOB  . Other Rash and Itching    PHYSICAL EXAM:  Performance status (ECOG): 0 - Asymptomatic  Vitals:   01/30/21 1534  BP: (!) 149/97  Pulse: 84  Resp: 18  Temp: (!) 97.3 F (  36.3 C)  SpO2: 100%   Wt Readings from Last 3 Encounters:  01/30/21 178 lb 6.4 oz (80.9 kg)  11/19/20 166 lb (75.3 kg)  08/28/20 163 lb (73.9 kg)   Physical Exam Vitals reviewed.  Constitutional:      Appearance: Normal appearance.  Cardiovascular:     Rate and Rhythm: Normal rate and regular rhythm.     Pulses: Normal pulses.     Heart sounds: Normal heart sounds.  Pulmonary:     Effort: Pulmonary effort is normal.     Breath sounds: Normal breath sounds.  Chest:  Breasts:     Right: No axillary adenopathy.     Left: No axillary adenopathy.    Abdominal:     Palpations: Abdomen is soft. There is no hepatomegaly, splenomegaly or mass.     Tenderness: There is no abdominal tenderness.     Hernia: No hernia is present.  Lymphadenopathy:     Upper Body:     Right upper body: No axillary or pectoral adenopathy.     Left upper body: No axillary or pectoral adenopathy.     Lower Body: No right inguinal adenopathy. No left inguinal adenopathy.  Skin:    Findings: Erythema (knuckle area of bilat hands) present.  Neurological:     General: No focal deficit present.     Mental Status: She is alert and oriented to person, place, and time.  Psychiatric:        Mood and Affect: Mood normal.        Behavior: Behavior normal.     LABORATORY DATA:  I have reviewed the labs as listed.  CBC Latest Ref Rng & Units 01/30/2021 08/30/2020 07/18/2020  WBC 4.0 - 10.5 K/uL 6.4 7.4 7.1  Hemoglobin 12.0 - 15.0 g/dL 19.5(H) 18.9(H) 18.4(H)  Hematocrit 36.0  - 46.0 % 60.2(H) 58.8(H) 57.2(H)  Platelets 150 - 400 K/uL 120(L) 160 153   CMP Latest Ref Rng & Units 01/30/2021 08/30/2020 06/21/2020  Glucose 70 - 99 mg/dL 91 82 -  BUN 6 - 20 mg/dL 14 15 -  Creatinine 0.44 - 1.00 mg/dL 0.66 0.89 -  Sodium 135 - 145 mmol/L 136 136 -  Potassium 3.5 - 5.1 mmol/L 4.3 4.1 -  Chloride 98 - 111 mmol/L 99 101 -  CO2 22 - 32 mmol/L 29 28 -  Calcium 8.9 - 10.3 mg/dL 9.1 8.9 9.1  Total Protein 6.5 - 8.1 g/dL 6.9 7.0 -  Total Bilirubin 0.3 - 1.2 mg/dL 0.8 0.7 -  Alkaline Phos 38 - 126 U/L 57 62 -  AST 15 - 41 U/L 16 14(L) -  ALT 0 - 44 U/L 17 17 -      Component Value Date/Time   RBC 5.89 (H) 01/30/2021 1424   MCV 102.2 (H) 01/30/2021 1424   MCV 95 06/21/2020 1130   MCH 33.1 01/30/2021 1424   MCHC 32.4 01/30/2021 1424   RDW 15.8 (H) 01/30/2021 1424   RDW 15.7 (H) 06/21/2020 1130   LYMPHSABS 1.7 01/30/2021 1424   LYMPHSABS 2.1 06/21/2020 1130   MONOABS 0.6 01/30/2021 1424   EOSABS 0.2 01/30/2021 1424   EOSABS 0.1 06/21/2020 1130   BASOSABS 0.1 01/30/2021 1424   BASOSABS 0.1 06/21/2020 1130    DIAGNOSTIC IMAGING:  I have independently reviewed the scans and discussed with the patient. No results found.   ASSESSMENT:  1. 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 Carboxyhemoglobin 4.2 (0.5-1.5) -Positive erythromelalgia. -Erythropoietin 12.7. -Phlebotomy  on 02/19/2020. She felt better after that with improvement in erythromelalgia. However she started to have the symptoms back in the past few days. -We reviewed results from 03/05/2020, hemoglobin was 18.9, hematocrit was 59.4. -CALR and MPL mutation were negative. -Echo on 03/05/2020 shows EF 60 to 65% with normal function.  Atria were normal. -Ultrasound of the abdomen on 04/05/2020 did not show any cystic lesions in the liver or kidneys.  Spleen size was normal. -Last phlebotomy on 06/05/2020.   PLAN:  1.  JAK2 negative erythrocytosis: -We have  previously made referral to one blood for phlebotomy.  She has not started it yet. -Reviewed labs from 01/30/2021.  Hemoglobin increased to 19.5 and hematocrit 60.2. -She also had elevated MCV of 102.2.  This is the first time MCV was elevated.  No clear underlying liver disease although she consumes 2 glasses of wine every night.  We will follow up on subsequent labs. -Her fingers become red occasionally, when she is exposed to heat or feeling anxious.  Does not report any itching after hot shower.  No headaches or vision changes. -Erythrocytosis most likely smoking-related although JAK2 negative polycythemia cannot be ruled out unless a bone marrow biopsy is done.  She is not interested in bone marrow biopsy at this time. -I have recommended phlebotomy every 6 to 8 weeks x 2. -She reports that she feels very weak for a few days after phlebotomy.  Hence she was reluctant to start phlebotomies.  However she will try them this time.  We have also discussed reduced dose of phlebotomy at 250 mL each time. -Plan to see her back in 4 months with repeat labs.  2.  Tobacco use: -She continues to smoke three fourths of a pack of cigarettes per day.  Orders placed this encounter:  No orders of the defined types were placed in this encounter.    Derek Jack, MD [facility] [phone]   I, Milinda Antis, am acting as a scribe for Dr. Sanda Linger.  I, Derek Jack MD, have reviewed the above documentation for accuracy and completeness, and I agree with the above.

## 2021-01-30 NOTE — Patient Instructions (Addendum)
[facility] at [facility] Discharge Instructions  You were seen today by Dr. Ellin Saba. He went over your recent results. You may go to [facility] to have 1-2 phlebotomies to bring down your blood count. Continue drinking 6 bottles of water daily. Dr. Ellin Saba will see you back in 4 months for labs and follow up.   Thank you for choosing [facility] at [facility] to provide your oncology and hematology care.  To afford each patient quality time with our provider, please arrive at least 15 minutes before your scheduled appointment time.   If you have a lab appointment with the Cancer Center please come in thru the Main Entrance and check in at the main information desk  You need to re-schedule your appointment should you arrive 10 or more minutes late.  We strive to give you quality time with our providers, and arriving late affects you and other patients whose appointments are after yours.  Also, if you no show three or more times for appointments you may be dismissed from the clinic at the providers discretion.     Again, thank you for choosing [facility].  Our hope is that these requests will decrease the amount of time that you wait before being seen by our physicians.       _____________________________________________________________  Should you have questions after your visit to [facility], please contact our office at [phone] between the hours of 8:00 a.m. and 4:30 p.m.  Voicemails left after 4:00 p.m. will not be returned until the following business day.  For prescription refill requests, have your pharmacy contact our office and allow 72 hours.    Cancer Center Support Programs:   > Cancer Support Group  2nd Tuesday of the month 1pm-2pm, Journey Room

## 2021-02-06 ENCOUNTER — Encounter (HOSPITAL_COMMUNITY): Payer: Self-pay

## 2021-02-12 IMAGING — CT CT ANGIO CHEST
2 of 6 series · 18 of 46 positions shown · IV contrast (iopamidol)
Comparison: 02/03/2019 chest radiograph.

CLINICAL DATA: 32 y/o F; PE suspected, intermediate prob, positive
D-dimer.

EXAM:
CT ANGIOGRAPHY CHEST WITH CONTRAST
TECHNIQUE: Multidetector CT imaging of the chest was performed using the
standard protocol during bolus administration of intravenous
contrast. Multiplanar CT image reconstructions and MIPs were
obtained to evaluate the vascular anatomy.
CONTRAST:  100mL [id number] IOPAMIDOL ([id number]) INJECTION 76%

[Series 6: thins · axial · 0.83mm/px · z∈[+1113,+1421]mm · 15 of 330 slices shown]
[im 11/330  lung]
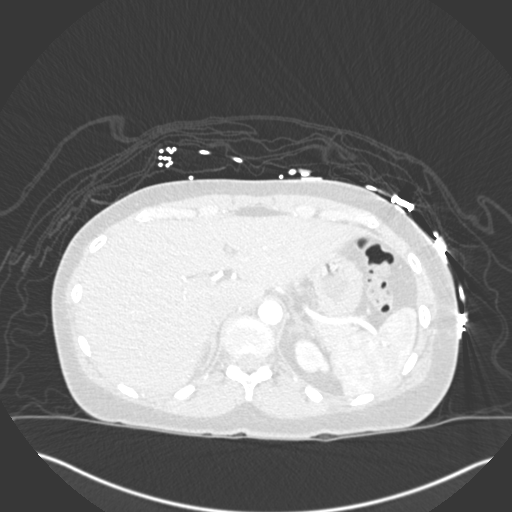
[im 33/330  soft-tissue]
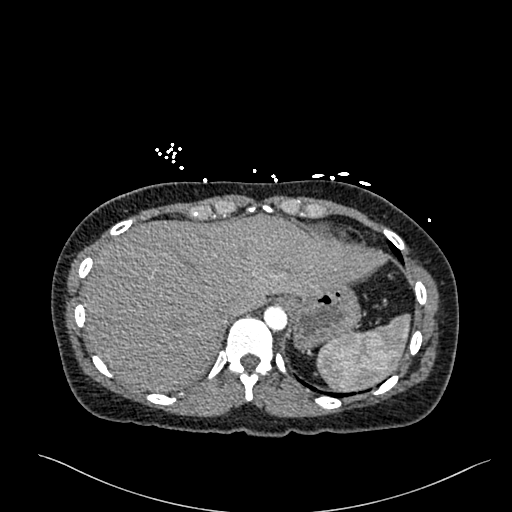
[im 55/330  lung]
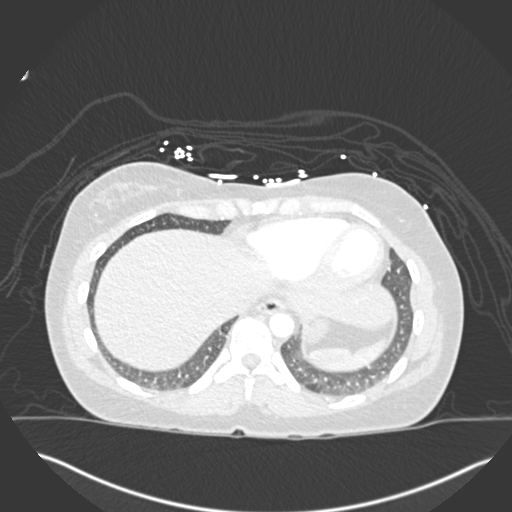
[im 77/330  soft-tissue]
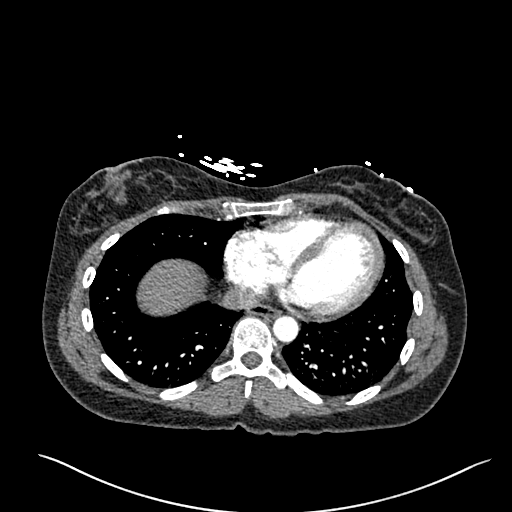
[im 99/330  lung]
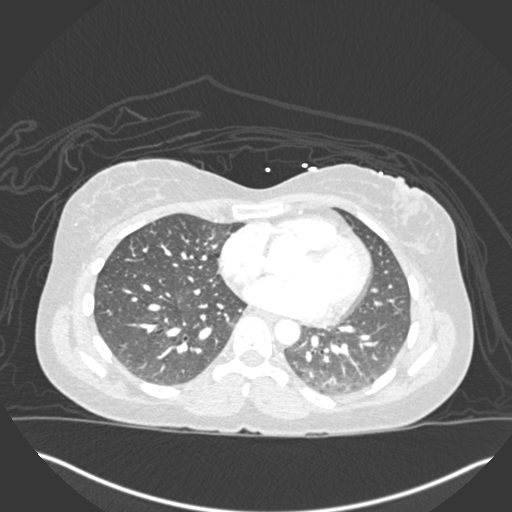
[im 121/330  soft-tissue]
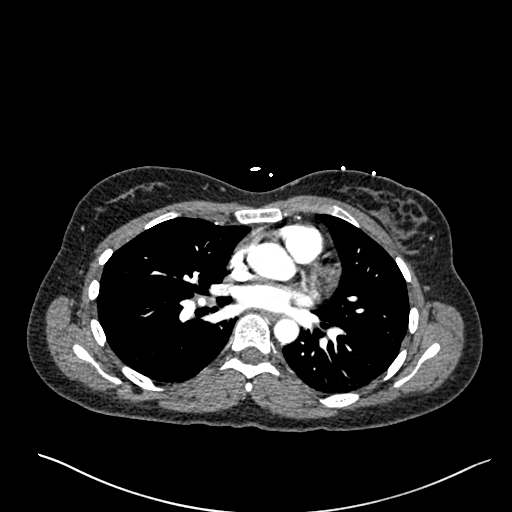
[im 143/330  lung]
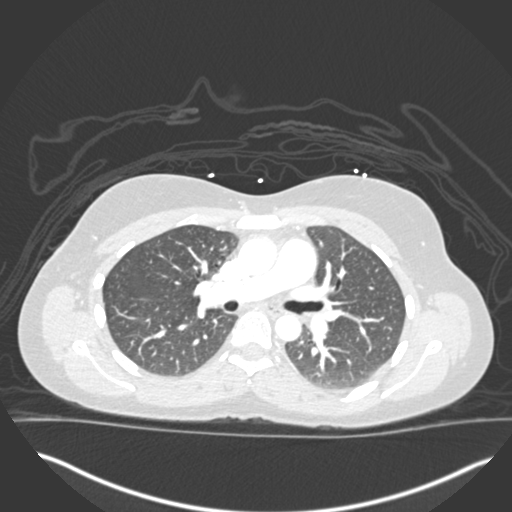
[im 165/330  soft-tissue]
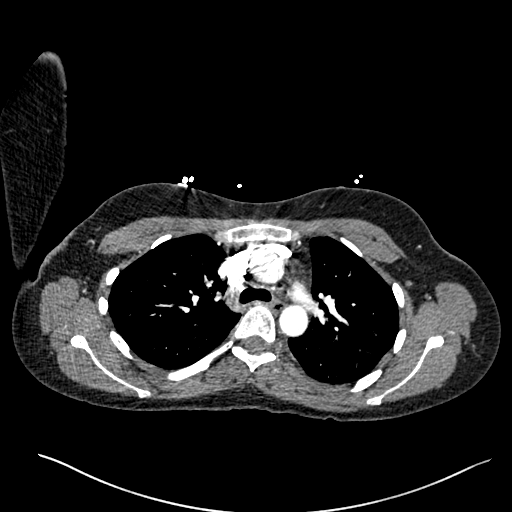
[im 187/330  lung]
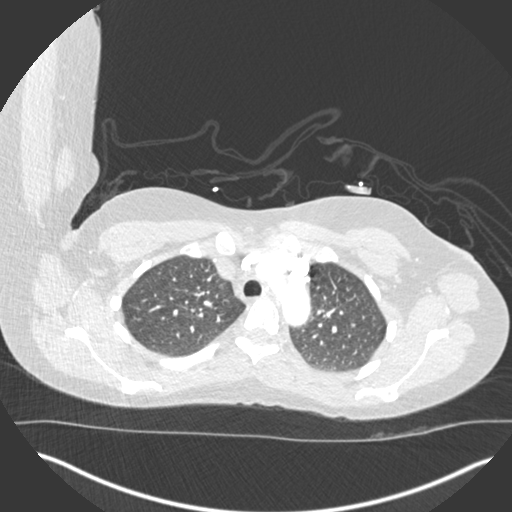
[im 209/330  soft-tissue]
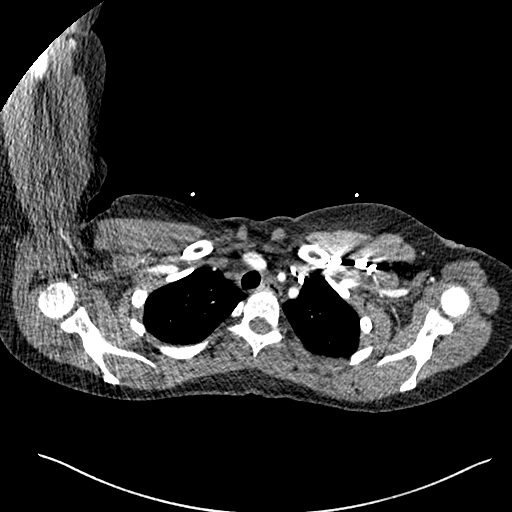
[im 231/330  lung]
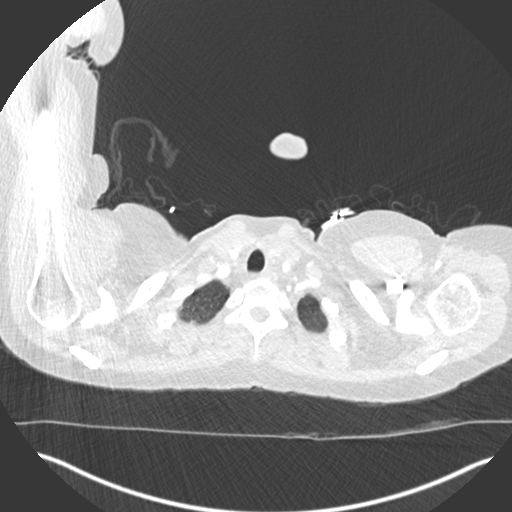
[im 253/330  soft-tissue]
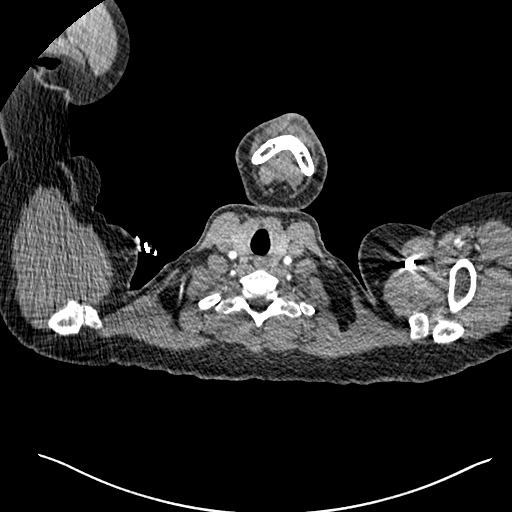
[im 275/330  lung]
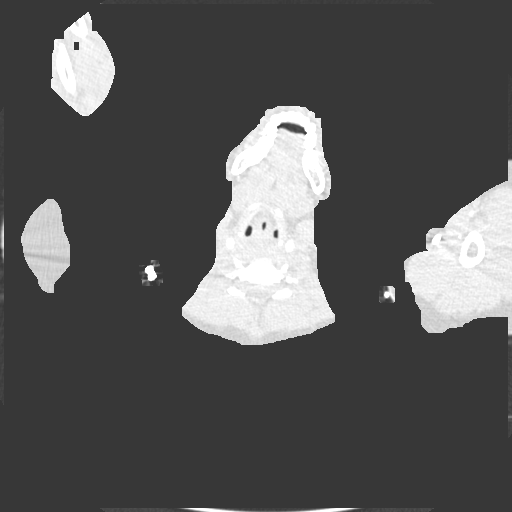
[im 297/330  soft-tissue]
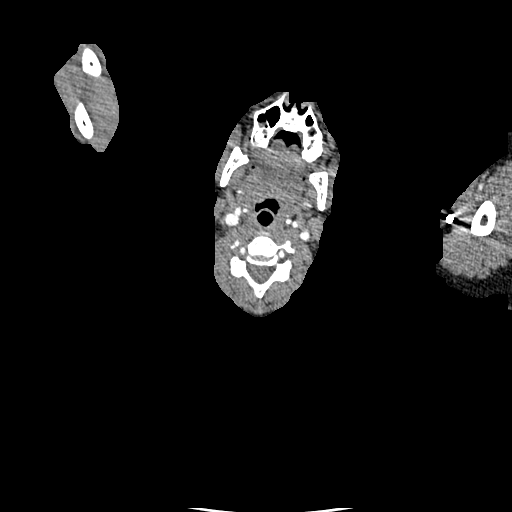
[im 319/330  lung]
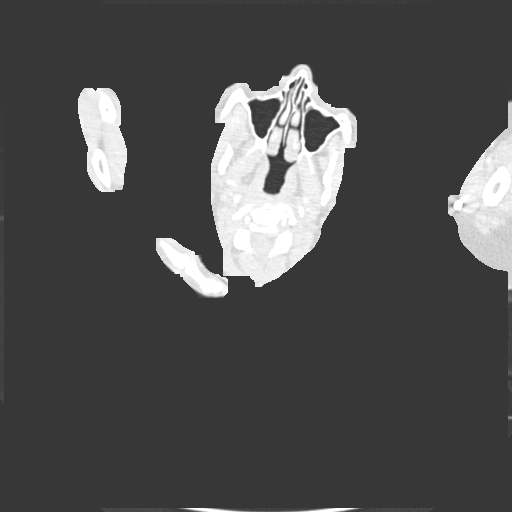

[Series 8: coronal mpr · coronal · 0.54mm/px · 3 of 122 slices shown]
[im 31/122  soft-tissue]
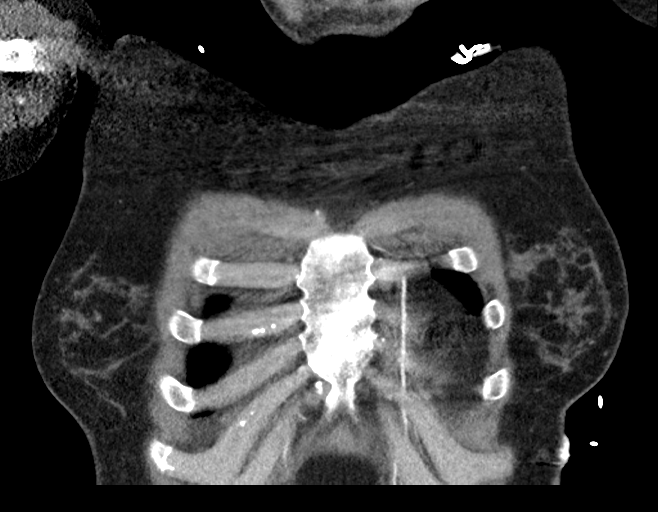
[im 61/122  soft-tissue]
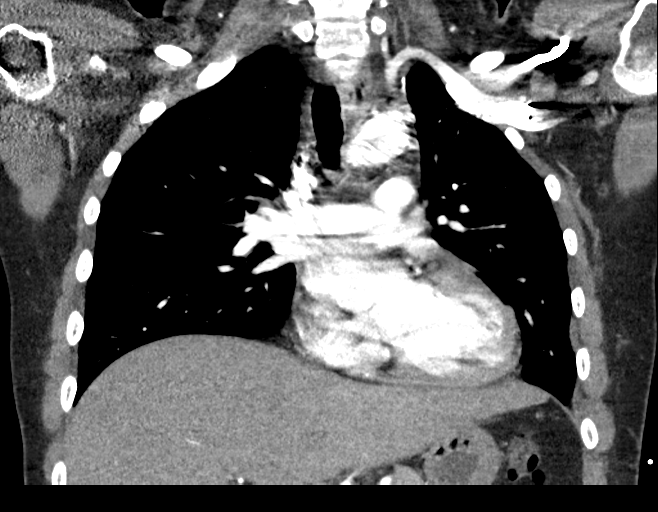
[im 91/122  soft-tissue]
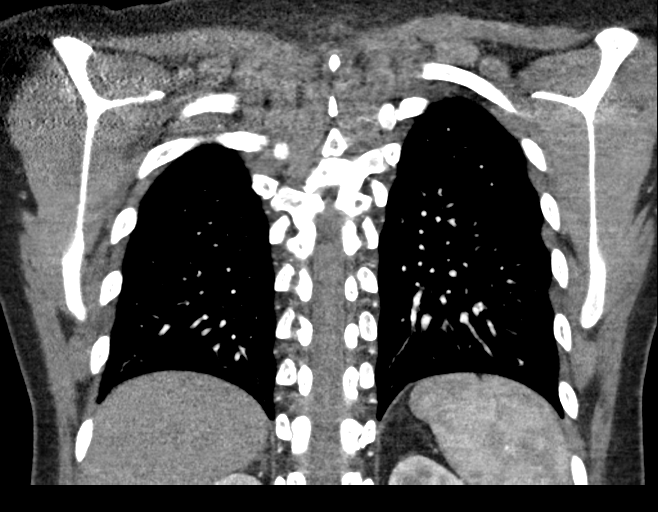

[18 of 46 positions shown; findings below may reference images not displayed]

FINDINGS: Cardiovascular: Satisfactory opacification of the pulmonary arteries
to the segmental level. No evidence of pulmonary embolism. Normal
heart size. No pericardial effusion.

Mediastinum/Nodes: No enlarged mediastinal, hilar, or axillary lymph
nodes. Thyroid gland, trachea, and esophagus demonstrate no
significant findings.

Lungs/Pleura: No consolidation. No pleural effusion or pneumothorax.
4 mm calcified granuloma in the right lung apex.

Upper Abdomen: No acute abnormality.

Musculoskeletal: No chest wall abnormality. No acute or significant
osseous findings.

Review of the MIP images confirms the above findings.
IMPRESSION: No pulmonary embolus identified. Unremarkable CTA of the chest.

## 2021-03-12 ENCOUNTER — Other Ambulatory Visit: Payer: Self-pay

## 2021-04-29 ENCOUNTER — Other Ambulatory Visit: Payer: Self-pay

## 2021-04-29 ENCOUNTER — Encounter: Payer: Self-pay | Admitting: Nurse Practitioner

## 2021-04-29 ENCOUNTER — Ambulatory Visit (INDEPENDENT_AMBULATORY_CARE_PROVIDER_SITE_OTHER): Payer: BC Managed Care – PPO | Admitting: Nurse Practitioner

## 2021-04-29 VITALS — BP 153/103 | HR 109 | Temp 97.9°F | Ht 68.0 in | Wt 178.0 lb

## 2021-04-29 DIAGNOSIS — D751 Secondary polycythemia: Secondary | ICD-10-CM

## 2021-04-29 DIAGNOSIS — Z0000 Encounter for general adult medical examination without abnormal findings: Secondary | ICD-10-CM

## 2021-04-29 DIAGNOSIS — Z0001 Encounter for general adult medical examination with abnormal findings: Secondary | ICD-10-CM

## 2021-04-29 DIAGNOSIS — L659 Nonscarring hair loss, unspecified: Secondary | ICD-10-CM | POA: Insufficient documentation

## 2021-04-29 DIAGNOSIS — Z139 Encounter for screening, unspecified: Secondary | ICD-10-CM

## 2021-04-29 DIAGNOSIS — F419 Anxiety disorder, unspecified: Secondary | ICD-10-CM | POA: Diagnosis not present

## 2021-04-29 DIAGNOSIS — Z720 Tobacco use: Secondary | ICD-10-CM

## 2021-04-29 DIAGNOSIS — Z91018 Allergy to other foods: Secondary | ICD-10-CM | POA: Diagnosis not present

## 2021-04-29 NOTE — Assessment & Plan Note (Signed)
-  GAD-7 = 19 today -no current anxiety medication -states she used ativan in the past, but she wasn't told not to drink with this... as a result she felt bad and has not used ativan since 2014 -she drinks 2-3 glasses of wine per night, consumes large quantities of caffeine, and smokes cigarettes... all of these could contribute to her anxiety -she has a therapist, but hasn't seen them since Dec 2021 -we discussed adding a medicine like sertraline, but she isn't interested at this time

## 2021-04-29 NOTE — Assessment & Plan Note (Signed)
-  started after child birth -will consider checking B12 and folate if MCV elevated -she considered hair, nails, and skin vitamins with biotin, but they are made with animal products and she is allergic -may be nutrient deficiency from diet restrictions d/t allergies

## 2021-04-29 NOTE — Progress Notes (Signed)
Established Patient Office Visit  Subjective:  Patient ID: Lindsey Jensen, female    DOB: 1986/08/28  Age: 35 y.o. MRN: [id number]  CC:  Chief Complaint  Patient presents with  . Annual Exam    Needs labs     HPI Lindsey Jensen presents for physical exam. She sees GYN for PAP smears.  She started having anxiety after delivering a baby.  For anxiety, she was going CBT, but this was a burden with getting to appointments and she states she knew a lot of the concepts already.  In 2014, she had her first panic attack.  She tried ativan in the past, but has never tried SSRI, SNRI, or other maintenance medicines.  She drinks 2-3 glasses of wine per night, smokes cigarettes, and consumes a large quantity of caffeine.  Past Medical History:  Diagnosis Date  . Allergy    Phreesia 05/26/2020  . Anxiety    Phreesia 05/26/2020  . Asthma    Phreesia 05/26/2020  . Contraceptive education 01/26/2014  . Erythrocytosis   . Medical history non-contributory   . Moderate persistent asthma, uncomplicated 7/34/1937    Past Surgical History:  Procedure Laterality Date  . NO PAST SURGERIES      Family History  Problem Relation Age of Onset  . Heart disease Father        heart attack  . Allergic rhinitis Father   . Diabetes Mother   . Depression Mother   . Anxiety disorder Mother   . Heart disease Mother   . Heart disease Maternal Grandmother        CHF  . Asthma Maternal Grandmother   . Diabetes Maternal Grandmother   . High Cholesterol Sister   . Kidney Stones Brother   . Alzheimer's disease Maternal Grandfather   . Asthma Maternal Grandfather   . Diabetes Maternal Uncle   . Angioedema Neg Hx   . Immunodeficiency Neg Hx     Social History   Socioeconomic History  . Marital status: Married    Spouse name: Not on file  . Number of children: 1  . Years of education: Not on file  . Highest education level: Bachelor's degree (e.g., BA, AB, BS)  Occupational History    Employer:  Building surveyor  Tobacco Use  . Smoking status: Current Every Day Smoker    Packs/day: 0.50    Years: 14.00    Pack years: 7.00    Types: Cigarettes  . Smokeless tobacco: Never Used  Vaping Use  . Vaping Use: Never used  Substance and Sexual Activity  . Alcohol use: Yes    Alcohol/week: 14.0 standard drinks    Types: 14 Glasses of wine per week    Comment: glass of wine every night    . Drug use: No  . Sexual activity: Yes    Birth control/protection: None  Other Topics Concern  . Not on file  Social History Narrative   Lives with husband and son      Dog: Pitbull: Lana       Enjoys: reading-reformed theology; fiction; drink wine, buy shoes, playing cards      Diet: eats all food groups outside of meat-some chicken at times   Caffeine: 3 cups of expresso daily    Water: 24 oz x 3 bottles daily       Wears seat belt   Does not use phone while driving   Art therapist in a lock box  Social Determinants of Health   Financial Resource Strain: Low Risk   . Difficulty of Paying Living Expenses: Not hard at all  Food Insecurity: No Food Insecurity  . Worried About Charity fundraiser in the Last Year: Never true  . Ran Out of Food in the Last Year: Never true  Transportation Needs: No Transportation Needs  . Lack of Transportation (Medical): No  . Lack of Transportation (Non-Medical): No  Physical Activity: Inactive  . Days of Exercise per Week: 0 days  . Minutes of Exercise per Session: 0 min  Stress: Stress Concern Present  . Feeling of Stress : To some extent  Social Connections: Moderately Isolated  . Frequency of Communication with Friends and Family: More than three times a week  . Frequency of Social Gatherings with Friends and Family: More than three times a week  . Attends Religious Services: Never  . Active Member of Clubs or Organizations: No  . Attends Archivist Meetings: Never  . Marital Status: Married   Human resources officer Violence: Not At Risk  . Fear of Current or Ex-Partner: No  . Emotionally Abused: No  . Physically Abused: No  . Sexually Abused: No    Outpatient Medications Prior to Visit  Medication Sig Dispense Refill  . azelastine (ASTELIN) 0.1 % nasal spray Place 2 sprays into both nostrils 2 (two) times daily. 30 mL 5  . Cholecalciferol (VITAMIN D) 50 MCG (2000 UT) tablet Take 2,000 Units by mouth daily.    Marland Kitchen EPINEPHrine (EPIPEN 2-PAK) 0.3 mg/0.3 mL IJ SOAJ injection Use as directed for severe allergic reaction 2 each 2  . fexofenadine (ALLEGRA) 180 MG tablet Take 180 mg by mouth daily.    . fluticasone (FLONASE) 50 MCG/ACT nasal spray Place 2 sprays into both nostrils daily. 16 g 5  . levalbuterol (XOPENEX HFA) 45 MCG/ACT inhaler TAKE 2 PUFFS BY MOUTH EVERY 6 HOURS AS NEEDED FOR WHEEZE OR SHORTNESS OF BREATH 15 each 12  . azithromycin (ZITHROMAX) 250 MG tablet Take 2 tablets (500 mg) on the first day. Then take 1 tablet (229m) on days 2-5. 6 tablet 0   No facility-administered medications prior to visit.    Allergies  Allergen Reactions  . Beef-Derived Products Anaphylaxis    Alpha-Gal Allergy   . Dairy Aid [Lactase] Other (See Comments)    SOB  . Meat [Alpha-Gal] Palpitations    All mammal. Symptom asthma .  .Marland KitchenPork-Derived Products Anaphylaxis    Alpha-Gal Allergy  . Shellfish Allergy Other (See Comments)    SOB  . Other Rash and Itching    ROS Review of Systems  Constitutional: Negative.   HENT: Negative.   Eyes: Negative.   Respiratory: Negative.   Cardiovascular: Negative.   Gastrointestinal: Negative.   Endocrine: Negative.   Genitourinary: Negative.   Musculoskeletal: Negative.   Skin:       -Hair loss after pregnancy  Allergic/Immunologic: Positive for environmental allergies and food allergies.  Neurological: Negative.   Hematological: Negative.   Psychiatric/Behavioral: Negative.       Objective:    Physical Exam Constitutional:       Appearance: Normal appearance.  HENT:     Head: Normocephalic and atraumatic.     Right Ear: External ear normal. There is impacted cerumen.     Left Ear: Tympanic membrane, ear canal and external ear normal.     Nose: Nose normal.     Mouth/Throat:     Mouth: Mucous membranes are moist.  Pharynx: Oropharynx is clear.  Eyes:     Extraocular Movements: Extraocular movements intact.     Conjunctiva/sclera: Conjunctivae normal.     Pupils: Pupils are equal, round, and reactive to light.  Cardiovascular:     Rate and Rhythm: Normal rate and regular rhythm.     Pulses: Normal pulses.     Heart sounds: Normal heart sounds.  Pulmonary:     Effort: Pulmonary effort is normal.     Breath sounds: Normal breath sounds.  Abdominal:     General: Abdomen is flat. Bowel sounds are normal.     Palpations: Abdomen is soft.  Musculoskeletal:        General: Normal range of motion.     Cervical back: Normal range of motion and neck supple.  Skin:    General: Skin is warm and dry.     Capillary Refill: Capillary refill takes less than 2 seconds.  Neurological:     General: No focal deficit present.     Mental Status: She is alert and oriented to person, place, and time.     Cranial Nerves: No cranial nerve deficit.     Sensory: No sensory deficit.     Motor: No weakness.     Coordination: Coordination normal.  Psychiatric:        Mood and Affect: Mood normal.        Behavior: Behavior normal.        Thought Content: Thought content normal.        Judgment: Judgment normal.     Comments: GAD-7 = 19     There were no vitals taken for this visit. Wt Readings from Last 3 Encounters:  01/30/21 178 lb 6.4 oz (80.9 kg)  11/19/20 166 lb (75.3 kg)  08/28/20 163 lb (73.9 kg)     Health Maintenance Due  Topic Date Due  . Hepatitis C Screening  Never done  . PAP SMEAR-Modifier  09/01/2019    There are no preventive care reminders to display for this patient.  Lab Results  Component  Value Date   TSH 0.904 06/21/2020   Lab Results  Component Value Date   WBC 6.4 01/30/2021   HGB 19.5 (H) 01/30/2021   HCT 60.2 (H) 01/30/2021   MCV 102.2 (H) 01/30/2021   PLT 120 (L) 01/30/2021   Lab Results  Component Value Date   NA 136 01/30/2021   K 4.3 01/30/2021   CO2 29 01/30/2021   GLUCOSE 91 01/30/2021   BUN 14 01/30/2021   CREATININE 0.66 01/30/2021   BILITOT 0.8 01/30/2021   ALKPHOS 57 01/30/2021   AST 16 01/30/2021   ALT 17 01/30/2021   PROT 6.9 01/30/2021   ALBUMIN 3.8 01/30/2021   CALCIUM 9.1 01/30/2021   ANIONGAP 8 01/30/2021   No results found for: CHOL No results found for: HDL No results found for: LDLCALC No results found for: TRIG No results found for: CHOLHDL Lab Results  Component Value Date   HGBA1C 4.9 09/12/2013      Assessment & Plan:   Problem List Items Addressed This Visit      Other   Routine medical exam    -physical exam performed today      Relevant Orders   CBC with Differential/Platelet   CMP14+EGFR   Lipid Panel With LDL/HDL Ratio   TSH   Tobacco use    -not interested in quitting today      Allergy to meat   Erythrocytosis    -followed  by hematology -will get labs today      Anxiety    -GAD-7 = 19 today -no current anxiety medication -states she used ativan in the past, but she wasn't told not to drink with this... as a result she felt bad and has not used ativan since 2014 -she drinks 2-3 glasses of wine per night, consumes large quantities of caffeine, and smokes cigarettes... all of these could contribute to her anxiety -she has a therapist, but hasn't seen them since Dec 2021 -we discussed adding a medicine like sertraline, but she isn't interested at this time       Relevant Orders   TSH   Hair loss    -started after child birth -will consider checking B12 and folate if MCV elevated -she considered hair, nails, and skin vitamins with biotin, but they are made with animal products and she is  allergic -may be nutrient deficiency from diet restrictions d/t allergies      Relevant Orders   TSH    Other Visit Diagnoses    Screening due    -  Primary   Relevant Orders   Hepatitis C Antibody      No orders of the defined types were placed in this encounter.   Follow-up: Return in about 1 year (around 04/29/2022) for Physical Exam.    Noreene Larsson, NP

## 2021-04-29 NOTE — Patient Instructions (Signed)
I put you down for a physical in a year.  If labs indicate otherwise, we may need to see you sooner.

## 2021-04-29 NOTE — Assessment & Plan Note (Signed)
-  physical exam performed today 

## 2021-04-29 NOTE — Assessment & Plan Note (Signed)
-  not interested in quitting today

## 2021-04-29 NOTE — Assessment & Plan Note (Signed)
-  followed by hematology -will get labs today

## 2021-04-30 LAB — CBC WITH DIFFERENTIAL/PLATELET
Basophils Absolute: 0.1 10*3/uL (ref 0.0–0.2)
Basos: 1 %
EOS (ABSOLUTE): 0.2 10*3/uL (ref 0.0–0.4)
Eos: 3 %
Hematocrit: 58.5 % — ABNORMAL HIGH (ref 34.0–46.6)
Hemoglobin: 20.1 g/dL (ref 11.1–15.9)
Immature Grans (Abs): 0 10*3/uL (ref 0.0–0.1)
Immature Granulocytes: 0 %
Lymphocytes Absolute: 1.6 10*3/uL (ref 0.7–3.1)
Lymphs: 28 %
MCH: 32.5 pg (ref 26.6–33.0)
MCHC: 34.4 g/dL (ref 31.5–35.7)
MCV: 95 fL (ref 79–97)
Monocytes Absolute: 0.4 10*3/uL (ref 0.1–0.9)
Monocytes: 7 %
Neutrophils Absolute: 3.5 10*3/uL (ref 1.4–7.0)
Neutrophils: 61 %
Platelets: 137 10*3/uL — ABNORMAL LOW (ref 150–450)
RBC: 6.19 x10E6/uL — ABNORMAL HIGH (ref 3.77–5.28)
RDW: 14.2 % (ref 11.7–15.4)
WBC: 5.8 10*3/uL (ref 3.4–10.8)

## 2021-04-30 LAB — CMP14+EGFR
ALT: 16 IU/L (ref 0–32)
AST: 17 IU/L (ref 0–40)
Albumin/Globulin Ratio: 1.6 (ref 1.2–2.2)
Albumin: 4.5 g/dL (ref 3.8–4.8)
Alkaline Phosphatase: 92 IU/L (ref 44–121)
BUN/Creatinine Ratio: 12 (ref 9–23)
BUN: 10 mg/dL (ref 6–20)
Bilirubin Total: 0.8 mg/dL (ref 0.0–1.2)
CO2: 25 mmol/L (ref 20–29)
Calcium: 9.3 mg/dL (ref 8.7–10.2)
Chloride: 99 mmol/L (ref 96–106)
Creatinine, Ser: 0.82 mg/dL (ref 0.57–1.00)
Globulin, Total: 2.8 g/dL (ref 1.5–4.5)
Glucose: 87 mg/dL (ref 65–99)
Potassium: 4.3 mmol/L (ref 3.5–5.2)
Sodium: 140 mmol/L (ref 134–144)
Total Protein: 7.3 g/dL (ref 6.0–8.5)
eGFR: 97 mL/min/{1.73_m2} (ref >59)

## 2021-04-30 LAB — LIPID PANEL WITH LDL/HDL RATIO
Cholesterol, Total: 130 mg/dL (ref 100–199)
HDL: 48 mg/dL (ref >39)
LDL Chol Calc (NIH): 68 mg/dL (ref 0–99)
LDL/HDL Ratio: 1.4 ratio (ref 0.0–3.2)
Triglycerides: 66 mg/dL (ref 0–149)
VLDL Cholesterol Cal: 14 mg/dL (ref 5–40)

## 2021-04-30 LAB — HEPATITIS C ANTIBODY: Hep C Virus Ab: <0.1 s/co ratio (ref 0.0–0.9)

## 2021-04-30 LAB — TSH: TSH: 2.02 u[IU]/mL (ref 0.450–4.500)

## 2021-04-30 NOTE — Progress Notes (Signed)
Hemoglobin is elevated, but that was expected. Continue following the advice of hematology for this.

## 2021-05-01 ENCOUNTER — Encounter (HOSPITAL_COMMUNITY): Payer: Self-pay

## 2021-05-01 NOTE — Telephone Encounter (Signed)
Please advise 

## 2021-05-02 ENCOUNTER — Encounter (HOSPITAL_COMMUNITY): Payer: Self-pay | Admitting: *Deleted

## 2021-05-28 ENCOUNTER — Other Ambulatory Visit (HOSPITAL_COMMUNITY): Payer: Self-pay | Admitting: *Deleted

## 2021-05-28 DIAGNOSIS — D582 Other hemoglobinopathies: Secondary | ICD-10-CM

## 2021-05-29 ENCOUNTER — Ambulatory Visit (HOSPITAL_COMMUNITY): Payer: BC Managed Care – PPO | Admitting: Hematology

## 2021-05-29 ENCOUNTER — Inpatient Hospital Stay (HOSPITAL_COMMUNITY): Payer: BC Managed Care – PPO

## 2021-05-29 ENCOUNTER — Other Ambulatory Visit (HOSPITAL_COMMUNITY): Payer: BC Managed Care – PPO

## 2021-06-02 ENCOUNTER — Other Ambulatory Visit: Payer: Self-pay

## 2021-06-02 ENCOUNTER — Inpatient Hospital Stay (HOSPITAL_COMMUNITY): Payer: BC Managed Care – PPO | Attending: Hematology

## 2021-06-02 DIAGNOSIS — D751 Secondary polycythemia: Secondary | ICD-10-CM

## 2021-06-02 DIAGNOSIS — D582 Other hemoglobinopathies: Secondary | ICD-10-CM

## 2021-06-02 DIAGNOSIS — F17210 Nicotine dependence, cigarettes, uncomplicated: Secondary | ICD-10-CM | POA: Diagnosis not present

## 2021-06-02 LAB — COMPREHENSIVE METABOLIC PANEL
ALT: 22 U/L (ref 0–44)
AST: 18 U/L (ref 15–41)
Albumin: 3.8 g/dL (ref 3.5–5.0)
Alkaline Phosphatase: 60 U/L (ref 38–126)
Anion gap: 7 (ref 5–15)
BUN: 16 mg/dL (ref 6–20)
CO2: 25 mmol/L (ref 22–32)
Calcium: 8.6 mg/dL — ABNORMAL LOW (ref 8.9–10.3)
Chloride: 101 mmol/L (ref 98–111)
Creatinine, Ser: 0.65 mg/dL (ref 0.44–1.00)
GFR, Estimated: >60 mL/min (ref >60)
Glucose, Bld: 93 mg/dL (ref 70–99)
Potassium: 4.2 mmol/L (ref 3.5–5.1)
Sodium: 133 mmol/L — ABNORMAL LOW (ref 135–145)
Total Bilirubin: 0.9 mg/dL (ref 0.3–1.2)
Total Protein: 6.7 g/dL (ref 6.5–8.1)

## 2021-06-02 LAB — CBC WITH DIFFERENTIAL/PLATELET
Abs Immature Granulocytes: 0.02 10*3/uL (ref 0.00–0.07)
Basophils Absolute: 0.1 10*3/uL (ref 0.0–0.1)
Basophils Relative: 1 %
Eosinophils Absolute: 0.2 10*3/uL (ref 0.0–0.5)
Eosinophils Relative: 3 %
HCT: 51.1 % — ABNORMAL HIGH (ref 36.0–46.0)
Hemoglobin: 16.5 g/dL — ABNORMAL HIGH (ref 12.0–15.0)
Immature Granulocytes: 0 %
Lymphocytes Relative: 28 %
Lymphs Abs: 2 10*3/uL (ref 0.7–4.0)
MCH: 32.4 pg (ref 26.0–34.0)
MCHC: 32.3 g/dL (ref 30.0–36.0)
MCV: 100.4 fL — ABNORMAL HIGH (ref 80.0–100.0)
Monocytes Absolute: 0.7 10*3/uL (ref 0.1–1.0)
Monocytes Relative: 9 %
Neutro Abs: 4.3 10*3/uL (ref 1.7–7.7)
Neutrophils Relative %: 59 %
Platelets: 153 10*3/uL (ref 150–400)
RBC: 5.09 MIL/uL (ref 3.87–5.11)
RDW: 14.6 % (ref 11.5–15.5)
WBC: 7.3 10*3/uL (ref 4.0–10.5)
nRBC: 0 % (ref 0.0–0.2)

## 2021-06-03 NOTE — Progress Notes (Signed)
[place], [place] [address]   CLINIC:  Medical Oncology/Hematology  PCP:  Perlie Mayo, NP [address] / [place] [address]  [phone]  REASON FOR VISIT:  Follow-up for erythrocytosis  PRIOR THERAPY: none  CURRENT THERAPY: phlebotomy every 4 weeks  INTERVAL HISTORY:  Lindsey Jensen, a 35 y.o. female, returns for routine follow-up for her erythrocytosis. Lindsey Jensen was last seen on 01/30/2021.  Today she reports feeling well. She had phlebotomies on 06/09, 05/26, and 05/12. She reports increased shakiness over the past 3 years. Since getting phlebotomies, she reports decreased episodes of redness in her eyes, redness in fingers while hot, and bruising. She denies any recent infections and itching after showers. She report occasional dizziness and light-headedness.   REVIEW OF SYSTEMS:  Review of Systems  Constitutional:  Positive for fatigue (50%). Negative for appetite change.  Skin:  Negative for itching.  Neurological:  Positive for dizziness and light-headedness.  All other systems reviewed and are negative.  PAST MEDICAL/SURGICAL HISTORY:  Past Medical History:  Diagnosis Date   Allergy    Phreesia 05/26/2020   Anxiety    Phreesia 05/26/2020   Asthma    Phreesia 05/26/2020   Contraceptive education 01/26/2014   Erythrocytosis    Medical history non-contributory    Moderate persistent asthma, uncomplicated 01/22/5426   Past Surgical History:  Procedure Laterality Date   NO PAST SURGERIES      SOCIAL HISTORY:  Social History   Socioeconomic History   Marital status: Married    Spouse name: Not on file   Number of children: 1   Years of education: Not on file   Highest education level: Bachelor's degree (e.g., BA, AB, BS)  Occupational History    Employer: Building surveyor  Tobacco Use   Smoking status: Every Day    Packs/day: 0.50    Years: 14.00    Pack years: 7.00    Types: Cigarettes    Smokeless tobacco: Never  Vaping Use   Vaping Use: Never used  Substance and Sexual Activity   Alcohol use: Yes    Alcohol/week: 14.0 standard drinks    Types: 14 Glasses of wine per week    Comment: glass of wine every night     Drug use: No   Sexual activity: Yes    Birth control/protection: None  Other Topics Concern   Not on file  Social History Narrative   Lives with husband and son      Dog: Pitbull: Lana       Enjoys: reading-reformed theology; fiction; drink wine, buy shoes, playing cards      Diet: eats all food groups outside of meat-some chicken at times   Caffeine: 3 cups of expresso daily    Water: 24 oz x 3 bottles daily       Wears seat belt   Does not use phone while driving   Art therapist in a lock box   Social Determinants of Health   Financial Resource Strain: Not on file  Food Insecurity: Not on file  Transportation Needs: Not on file  Physical Activity: Not on file  Stress: Not on file  Social Connections: Not on file  Intimate Partner Violence: Not on file    FAMILY HISTORY:  Family History  Problem Relation Age of Onset   Heart disease Father        heart attack   Allergic rhinitis  Father    Diabetes Mother    Depression Mother    Anxiety disorder Mother    Heart disease Mother    Heart disease Maternal Grandmother        CHF   Asthma Maternal Grandmother    Diabetes Maternal Grandmother    High Cholesterol Sister    Kidney Stones Brother    Alzheimer's disease Maternal Grandfather    Asthma Maternal Grandfather    Diabetes Maternal Uncle    Angioedema Neg Hx    Immunodeficiency Neg Hx     CURRENT MEDICATIONS:  Current Outpatient Medications  Medication Sig Dispense Refill   azelastine (ASTELIN) 0.1 % nasal spray Place 2 sprays into both nostrils 2 (two) times daily. 30 mL 5   Cholecalciferol (VITAMIN D) 50 MCG (2000 UT) tablet Take 2,000 Units by mouth daily.     EPINEPHrine (EPIPEN 2-PAK) 0.3 mg/0.3 mL IJ SOAJ  injection Use as directed for severe allergic reaction 2 each 2   fexofenadine (ALLEGRA) 180 MG tablet Take 180 mg by mouth daily.     fluticasone (FLONASE) 50 MCG/ACT nasal spray Place 2 sprays into both nostrils daily. 16 g 5   levalbuterol (XOPENEX HFA) 45 MCG/ACT inhaler TAKE 2 PUFFS BY MOUTH EVERY 6 HOURS AS NEEDED FOR WHEEZE OR SHORTNESS OF BREATH 15 each 12   No current facility-administered medications for this visit.    ALLERGIES:  Allergies  Allergen Reactions   Beef-Derived Products Anaphylaxis    Alpha-Gal Allergy    Dairy Aid [Lactase] Other (See Comments)    SOB   Meat [Alpha-Gal] Palpitations    All mammal. Symptom asthma .   Pork-Derived Products Anaphylaxis    Alpha-Gal Allergy   Shellfish Allergy Other (See Comments)    SOB   Other Rash and Itching    PHYSICAL EXAM:  Performance status (ECOG): 0 - Asymptomatic  There were no vitals filed for this visit. Wt Readings from Last 3 Encounters:  04/29/21 178 lb (80.7 kg)  01/30/21 178 lb 6.4 oz (80.9 kg)  11/19/20 166 lb (75.3 kg)   Physical Exam Vitals reviewed.  Constitutional:      Appearance: Normal appearance.  Cardiovascular:     Rate and Rhythm: Normal rate and regular rhythm.     Pulses: Normal pulses.     Heart sounds: Normal heart sounds.  Pulmonary:     Effort: Pulmonary effort is normal.     Breath sounds: Normal breath sounds.  Chest:  Breasts:    Right: No axillary adenopathy or supraclavicular adenopathy.     Left: No axillary adenopathy or supraclavicular adenopathy.  Abdominal:     Palpations: Abdomen is soft. There is no hepatomegaly, splenomegaly or mass.     Tenderness: There is no abdominal tenderness.  Musculoskeletal:     Right lower leg: No edema.     Left lower leg: No edema.  Lymphadenopathy:     Cervical: No cervical adenopathy.     Right cervical: No superficial cervical adenopathy.    Left cervical: No superficial cervical adenopathy.     Upper Body:     Right upper  body: No supraclavicular, axillary or pectoral adenopathy.     Left upper body: No supraclavicular, axillary or pectoral adenopathy.  Neurological:     General: No focal deficit present.     Mental Status: She is alert and oriented to person, place, and time.  Psychiatric:        Mood and Affect: Mood normal.  Behavior: Behavior normal.    LABORATORY DATA:  I have reviewed the labs as listed.  CBC Latest Ref Rng & Units 06/02/2021 04/29/2021 01/30/2021  WBC 4.0 - 10.5 K/uL 7.3 5.8 6.4  Hemoglobin 12.0 - 15.0 g/dL 16.5(H) 20.1(HH) 19.5(H)  Hematocrit 36.0 - 46.0 % 51.1(H) 58.5(H) 60.2(H)  Platelets 150 - 400 K/uL 153 137(L) 120(L)   CMP Latest Ref Rng & Units 06/02/2021 04/29/2021 01/30/2021  Glucose 70 - 99 mg/dL 93 87 91  BUN 6 - 20 mg/dL 16 10 14   Creatinine 0.44 - 1.00 mg/dL 0.65 0.82 0.66  Sodium 135 - 145 mmol/L 133(L) 140 136  Potassium 3.5 - 5.1 mmol/L 4.2 4.3 4.3  Chloride 98 - 111 mmol/L 101 99 99  CO2 22 - 32 mmol/L 25 25 29   Calcium 8.9 - 10.3 mg/dL 8.6(L) 9.3 9.1  Total Protein 6.5 - 8.1 g/dL 6.7 7.3 6.9  Total Bilirubin 0.3 - 1.2 mg/dL 0.9 0.8 0.8  Alkaline Phos 38 - 126 U/L 60 92 57  AST 15 - 41 U/L 18 17 16   ALT 0 - 44 U/L 22 16 17       Component Value Date/Time   RBC 5.09 06/02/2021 1613   MCV 100.4 (H) 06/02/2021 1613   MCV 95 04/29/2021 0850   MCH 32.4 06/02/2021 1613   MCHC 32.3 06/02/2021 1613   RDW 14.6 06/02/2021 1613   RDW 14.2 04/29/2021 0850   LYMPHSABS 2.0 06/02/2021 1613   LYMPHSABS 1.6 04/29/2021 0850   MONOABS 0.7 06/02/2021 1613   EOSABS 0.2 06/02/2021 1613   EOSABS 0.2 04/29/2021 0850   BASOSABS 0.1 06/02/2021 1613   BASOSABS 0.1 04/29/2021 0850    DIAGNOSTIC IMAGING:  I have independently reviewed the scans and discussed with the patient. No results found.   ASSESSMENT:  1.  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  Carboxyhemoglobin 4.2 (0.5-1.5) -Positive  erythromelalgia. -Erythropoietin 12.7. -Phlebotomy on 02/19/2020.  She felt better after that with improvement in erythromelalgia.  However she started to have the symptoms back in the past few days. -We reviewed results from 03/05/2020, hemoglobin was 18.9, hematocrit was 59.4. -CALR and MPL mutation were negative. -Echo on 03/05/2020 shows EF 60 to 65% with normal function.  Atria were normal. -Ultrasound of the abdomen on 04/05/2020 did not show any cystic lesions in the liver or kidneys.  Spleen size was normal. - She has declined bone marrow biopsy in the past.   PLAN:  1.  JAK2 negative erythrocytosis: - She has phlebotomies done at 1 blood.  Last phlebotomy was on 05/29/2021, 500 mL. - She had another 500 mL 2 weeks prior to that and 250 mL 4 weeks prior. - She reports improvement in her redness of eyes.  She also reported improvement in erythema fingers.  She feels occasional dizziness which has not improved.  She does not have any aquagenic pruritus.  She reports more shakiness in the last 2 to 3 years, and thinks is coming from anxiety/panic attacks. - Overall she reports that she is feeling better after phlebotomies. - We will schedule her for another phlebotomy around 06/12/2021. - After that we will schedule her for maintenance phlebotomies every 4 weeks.  We will try to keep her hematocrit around 45-50. - We will see her back in 4 months for follow-up with repeat CBC and CMP.    2.  Tobacco use: - She smokes around three fourths of a pack of cigarettes daily.  Orders  placed this encounter:  No orders of the defined types were placed in this encounter.    Derek Jack, MD [place] [phone]   I, Thana Ates, am acting as a scribe for Dr. Derek Jack.  I, Derek Jack MD, have reviewed the above documentation for accuracy and completeness, and I agree with the above.

## 2021-06-04 ENCOUNTER — Inpatient Hospital Stay (HOSPITAL_BASED_OUTPATIENT_CLINIC_OR_DEPARTMENT_OTHER): Payer: BC Managed Care – PPO | Admitting: Hematology

## 2021-06-04 ENCOUNTER — Other Ambulatory Visit: Payer: Self-pay

## 2021-06-04 VITALS — BP 120/86 | HR 101 | Temp 98.1°F | Resp 18 | Wt 175.7 lb

## 2021-06-04 DIAGNOSIS — D751 Secondary polycythemia: Secondary | ICD-10-CM | POA: Diagnosis not present

## 2021-06-04 DIAGNOSIS — F17210 Nicotine dependence, cigarettes, uncomplicated: Secondary | ICD-10-CM | POA: Diagnosis not present

## 2021-06-04 NOTE — Patient Instructions (Addendum)
[facility] at [facility] °Discharge Instructions ° °You were seen today by Dr. Katragadda. He went over your recent results. Dr. Katragadda will see you back in 4 months for labs and follow up. ° ° °Thank you for choosing [facility] at [facility] to provide your oncology and hematology care.  To afford each patient quality time with our provider, please arrive at least 15 minutes before your scheduled appointment time.  ° °If you have a lab appointment with the Cancer Center please come in thru the Main Entrance and check in at the main information desk ° °You need to re-schedule your appointment should you arrive 10 or more minutes late.  We strive to give you quality time with our providers, and arriving late affects you and other patients whose appointments are after yours.  Also, if you no show three or more times for appointments you may be dismissed from the clinic at the providers discretion.     °Again, thank you for choosing [facility].  Our hope is that these requests will decrease the amount of time that you wait before being seen by our physicians.       °_____________________________________________________________ ° °Should you have questions after your visit to  Cancer Center, please contact our office at [phone] between the hours of 8:00 a.m. and 4:30 p.m.  Voicemails left after 4:00 p.m. will not be returned until the following business day.  For prescription refill requests, have your pharmacy contact our office and allow 72 hours.   ° °Cancer Center Support Programs:  ° °> Cancer Support Group  °2nd Tuesday of the month 1pm-2pm, Journey Room  ° ° °

## 2021-06-06 ENCOUNTER — Encounter: Payer: Self-pay | Admitting: Family Medicine

## 2021-06-06 ENCOUNTER — Ambulatory Visit (INDEPENDENT_AMBULATORY_CARE_PROVIDER_SITE_OTHER): Payer: BC Managed Care – PPO | Admitting: Family Medicine

## 2021-06-06 ENCOUNTER — Other Ambulatory Visit: Payer: Self-pay

## 2021-06-06 ENCOUNTER — Other Ambulatory Visit: Payer: Self-pay | Admitting: Allergy & Immunology

## 2021-06-06 VITALS — BP 138/82 | HR 80 | Temp 98.3°F | Resp 18 | Ht 68.0 in | Wt 176.4 lb

## 2021-06-06 DIAGNOSIS — J3089 Other allergic rhinitis: Secondary | ICD-10-CM | POA: Diagnosis not present

## 2021-06-06 DIAGNOSIS — Z720 Tobacco use: Secondary | ICD-10-CM | POA: Diagnosis not present

## 2021-06-06 DIAGNOSIS — J302 Other seasonal allergic rhinitis: Secondary | ICD-10-CM | POA: Diagnosis not present

## 2021-06-06 DIAGNOSIS — J4520 Mild intermittent asthma, uncomplicated: Secondary | ICD-10-CM | POA: Diagnosis not present

## 2021-06-06 DIAGNOSIS — T7800XA Anaphylactic reaction due to unspecified food, initial encounter: Secondary | ICD-10-CM

## 2021-06-06 MED ORDER — FLUTICASONE PROPIONATE 50 MCG/ACT NA SUSP: 2.0000 | Freq: Every day | NASAL | 5 refills | Status: DC

## 2021-06-06 NOTE — Progress Notes (Signed)
[address] [place] [address] Dept: [phone]  FOLLOW UP NOTE  Patient ID: [name], female    DOB: November 28, 1986  Age: 35 y.o. MRN: [id number] Date of Office Visit: 06/06/2021  Assessment  Chief Complaint: Asthma  HPI Lindsey Jensen is a 35 year old female who presents to the clinic for a follow up visit. She was last seen in this clinic on 10/09/2020 by Dr. Dellis Anes for evaluation of asthma, allergic rhinitis, tobacco use, and food allergies to red meat, shellfish, milk, peanuts, and tree nuts. At today's visit, she reports her asthma has been moderately well controlled with occasional shortness of breath that she believes related to anxiety. She denies wheeze or cough with activity of rest. She uses her albuterol inhaler before activity and about once every 2 months for rescue. Allergic rhinitis is reported as moderately well controlled with symptoms including clear rhinorrhea and nasal congestion occurring in the morning only. She reports occasional sneezing and post nasal drainage. She continues Allegra 180 mg once a day and uses Flonase and azelastine as needed. She reports her symptoms have been rather mild this spring. She is traveling to [place] next month and is anxious about if her symptoms will be controlled. She continues to avoid red meats, shellfish, peanuts and tree nuts. She does report occasional consumption of cheese without adverse effects. She also reports that she has eaten one [employer] peanut butter cup without adverse reaction. She continues to smoke about 10 cigarettes a day. Her current medications are listed in the chart.     Drug Allergies:  Allergies  Allergen Reactions   Beef-Derived Products Anaphylaxis    Alpha-Gal Allergy    Dairy Aid [Lactase] Other (See Comments)    SOB   Meat [Alpha-Gal] Palpitations    All mammal. Symptom asthma .   Pork-Derived Products Anaphylaxis    Alpha-Gal Allergy   Shellfish Allergy Other (See Comments)     SOB   Other Rash and Itching    Physical Exam: BP 138/82 (BP Location: Left Arm, Patient Position: Sitting, Cuff Size: Normal)   Pulse 80   Temp 98.3 F (36.8 C) (Temporal)   Resp 18   Ht 5\' 8"  (1.727 m)   Wt 176 lb 6.4 oz (80 kg)   SpO2 98%   BMI 26.82 kg/m    Physical Exam Vitals reviewed.  Constitutional:      Appearance: Normal appearance.  HENT:     Head: Normocephalic and atraumatic.     Right Ear: Tympanic membrane normal.     Left Ear: Tympanic membrane normal.     Nose:     Comments: Bilateral nares slightly erythematous with clear nasal drainage noted. Pharynx normal. Ears normal. Eyes normal.    Mouth/Throat:     Pharynx: Oropharynx is clear.  Eyes:     Conjunctiva/sclera: Conjunctivae normal.  Cardiovascular:     Rate and Rhythm: Normal rate and regular rhythm.     Heart sounds: Normal heart sounds. No murmur heard. Pulmonary:     Effort: Pulmonary effort is normal.     Breath sounds: Normal breath sounds.     Comments: Lungs clear to auscultation Musculoskeletal:        General: Normal range of motion.     Cervical back: Normal range of motion and neck supple.  Skin:    General: Skin is warm and dry.  Neurological:     Mental Status: She is alert and oriented to person, place,  and time.  Psychiatric:        Mood and Affect: Mood normal.        Behavior: Behavior normal.        Thought Content: Thought content normal.        Judgment: Judgment normal.    Diagnostics: FVC 3.55, FEV1 3.09. Predicted FVC 4.34, predicted FEV1 3.61. Spirometry indicates normal ventilatory function.   Assessment and Plan: 1. Mild intermittent asthma without complication   2. Seasonal and perennial allergic rhinitis   3. Tobacco use   4. Allergy with anaphylaxis due to food     Meds ordered this encounter  Medications   fluticasone (FLONASE) 50 MCG/ACT nasal spray    Sig: Place 2 sprays into both nostrils daily.    Dispense:  16 g    Refill:  5      Patient Instructions  Asthma Continue albuterol 2 puffs once every 4 hours as needed for cough or wheeze You may use albuterol 2 puffs 5-15 minutes before activity to decrease cough or wheeze  Allergic rhinitis Continue allergen avoidance measures directed toward dust mites, cat, dog, cockroach, and tree pollen as listed below Continue Allegra 180 mg once a day as needed for a runny nose or itch. Remember to rotate to a different antihistamine about every 3 months. Some examples of over the counter antihistamines include Zyrtec (cetirizine), Xyzal (levocetirizine), Allegra (fexofenadine), and Claritin (loratidine).  Continue Flonase 2 sprays in each nostril once a day as needed for a stuffy nose.  In the right nostril, point the applicator out toward the right ear. In the left nostril, point the applicator out toward the left ear Continue azelastine 2 sprays in each nostril twice a day as needed for nasal symptoms Consider saline nasal rinses as needed for nasal symptoms. Use this before any medicated nasal sprays for best result If your allergies flare while you are on vacation, begin prednisone 10 mg tablets. Take 2 tablets once a day for 4 days, then take 1 tablet on the 5th day, then stop.    Food allergy Continue to avoid red meats, shellfish, peanuts, and tree nuts.  In case of an allergic reaction, take Benadryl 50 mg every 4 hours, and if life-threatening symptoms occur, inject with EpiPen 0.3 mg.  Tobacco Quit smoking or try to cut down.   Call the clinic if this treatment plan is not working well for you  Follow up in 1 year or sooner if needed.  Return in about 1 year (around 06/06/2022), or if symptoms worsen or fail to improve.    Thank you for the opportunity to care for this patient.  Please do not hesitate to contact me with questions.  Thermon Leyland, FNP [facility]

## 2021-06-06 NOTE — Telephone Encounter (Signed)
Patient is scheduled to be seen at the [place] office at 10:15. She plans to be out of town next week and would like all medications refilled.

## 2021-06-06 NOTE — Patient Instructions (Signed)
Asthma Continue albuterol 2 puffs once every 4 hours as needed for cough or wheeze You may use albuterol 2 puffs 5-15 minutes before activity to decrease cough or wheeze  Allergic rhinitis Continue allergen avoidance measures directed toward dust mites, cat, dog, cockroach, and tree pollen as listed below Continue Allegra 180 mg once a day as needed for a runny nose or itch. Remember to rotate to a different antihistamine about every 3 months. Some examples of over the counter antihistamines include Zyrtec (cetirizine), Xyzal (levocetirizine), Allegra (fexofenadine), and Claritin (loratidine).  Continue Flonase 2 sprays in each nostril once a day as needed for a stuffy nose.  In the right nostril, point the applicator out toward the right ear. In the left nostril, point the applicator out toward the left ear Continue azelastine 2 sprays in each nostril twice a day as needed for nasal symptoms Consider saline nasal rinses as needed for nasal symptoms. Use this before any medicated nasal sprays for best result If your allergies flare while you are on vacation, begin prednisone 10 mg tablets. Take 2 tablets once a day for 4 days, then take 1 tablet on the 5th day, then stop.    Food allergy Continue to avoid red meats, shellfish, peanuts, and tree nuts.  In case of an allergic reaction, take Benadryl 50 mg every 4 hours, and if life-threatening symptoms occur, inject with EpiPen 0.3 mg.  Tobacco Quit smoking or try to cut down.   Call the clinic if this treatment plan is not working well for you  Follow up in 1 year or sooner if needed.

## 2021-07-21 ENCOUNTER — Other Ambulatory Visit: Payer: Self-pay

## 2021-07-21 ENCOUNTER — Ambulatory Visit (INDEPENDENT_AMBULATORY_CARE_PROVIDER_SITE_OTHER): Payer: BC Managed Care – PPO | Admitting: Adult Health

## 2021-07-21 ENCOUNTER — Encounter: Payer: Self-pay | Admitting: Adult Health

## 2021-07-21 ENCOUNTER — Other Ambulatory Visit (HOSPITAL_COMMUNITY)
Admission: RE | Admit: 2021-07-21 | Discharge: 2021-07-21 | Disposition: A | Discharge status: Home / Self Care | Payer: BC Managed Care – PPO | Source: Ambulatory Visit | Attending: Adult Health | Admitting: Adult Health

## 2021-07-21 VITALS — BP 126/85 | HR 89 | Ht 68.0 in | Wt 179.0 lb

## 2021-07-21 DIAGNOSIS — N920 Excessive and frequent menstruation with regular cycle: Secondary | ICD-10-CM

## 2021-07-21 DIAGNOSIS — L0293 Carbuncle, unspecified: Secondary | ICD-10-CM | POA: Insufficient documentation

## 2021-07-21 DIAGNOSIS — Z01419 Encounter for gynecological examination (general) (routine) without abnormal findings: Secondary | ICD-10-CM | POA: Diagnosis not present

## 2021-07-21 DIAGNOSIS — L0292 Furuncle, unspecified: Secondary | ICD-10-CM

## 2021-07-21 DIAGNOSIS — D751 Secondary polycythemia: Secondary | ICD-10-CM | POA: Diagnosis not present

## 2021-07-21 DIAGNOSIS — F419 Anxiety disorder, unspecified: Secondary | ICD-10-CM

## 2021-07-21 DIAGNOSIS — N946 Dysmenorrhea, unspecified: Secondary | ICD-10-CM

## 2021-07-21 MED ORDER — CLINDAMYCIN PHOSPHATE 1 % EX GEL: Freq: Two times a day (BID) | CUTANEOUS | 1 refills | Status: DC

## 2021-07-21 NOTE — Progress Notes (Signed)
Patient ID: Lindsey Jensen, female   DOB: Aug 21, 1986, 35 y.o.   MRN: [id number] History of Present Illness: Lindsey Jensen is a 35 year old white female,married, G1P1 in for a well woman gyn exam and pap. She says she has started getting small boils again and periods heavier and has cramps and nausea and diarrhea and more acne,her son is 3 now.  PCP is Dr Allena Katz.   Current Medications, Allergies, Past Medical History, Past Surgical History, Family History and Social History were reviewed in [employer] record.     Review of Systems:  Patient denies any daily headaches, hearing loss, fatigue, blurred vision, shortness of breath, chest pain, abdominal pain, problems with bowel movements, urination, or intercourse. No joint pain or mood swings.  See HPI for positives.  Physical Exam:BP 126/85 (BP Location: Right Arm, Patient Position: Sitting, Cuff Size: Normal)   Pulse 89   Ht 5\' 8"  (1.727 m)   Wt 179 lb (81.2 kg)   LMP 07/14/2021   BMI 27.22 kg/m   General:  Well developed, well nourished, no acute distress Skin:  Warm and dry Neck:  Midline trachea, normal thyroid, good ROM, no lymphadenopathy Lungs; Clear to auscultation bilaterally Breast:  No dominant palpable mass, retraction, or nipple discharge Cardiovascular: Regular rate and rhythm Abdomen:  Soft, non tender, no hepatosplenomegaly Pelvic:  External genitalia is normal in appearance, no lesions.  The vagina is normal in appearance. Urethra has no lesions or masses. The cervix is bulbous.Pap with HR HPV genotyping performed.  Uterus is felt to be normal size, shape, and contour.  No adnexal masses or tenderness noted.Bladder is non tender, no masses felt. Rectal: Deferred Extremities/musculoskeletal:  No swelling or varicosities noted, no clubbing or cyanosis,small boil right inner arm Psych:  No mood changes, alert and cooperative,seems happy AA is 4 Fall risk is low Depression screen [facility] 2/9 07/21/2021 04/29/2021  11/19/2020  Decreased Interest 0 0 0  Down, Depressed, Hopeless 0 0 0  PHQ - 2 Score 0 0 0  Altered sleeping 0 - -  Tired, decreased energy 1 - -  Change in appetite 0 - -  Feeling bad or failure about yourself  0 - -  Trouble concentrating 0 - -  Moving slowly or fidgety/restless 0 - -  Suicidal thoughts 0 - -  PHQ-9 Score 1 - -  Difficult doing work/chores - - -    GAD 7 : Generalized Anxiety Score 07/21/2021 04/29/2021 08/28/2020 05/28/2020  Nervous, Anxious, on Edge 2 3 3 3   Control/stop worrying 1 3 2 1   Worry too much - different things 2 3 2 1   Trouble relaxing 1 3 2  0  Restless 0 1 2 0  Easily annoyed or irritable 2 3 1 3   Afraid - awful might happen 2 3 2 1   Total GAD 7 Score 10 19 14 9   Anxiety Difficulty - Somewhat difficult Very difficult Somewhat difficult    She declines meds  [facility] - 07/21/21 1550       Pregnancy Intention Screening   Does the patient want to become pregnant in the next year? No    Does the patient's partner want to become pregnant in the next year? No    Would the patient like to discuss contraceptive options today? No      Contraception Wrap Up   Current Method Vasectomy    End Method Vasectomy    Contraception Counseling Provided No  Examination chaperoned by Faith Rogue LPN  Impression and Plan: 1. Encounter for gynecological examination with Papanicolaou smear of cervix Pap sent Physical in 1 year Pap in 3 if normal  2. Anxiety She declines meds  3. Erythrocytosis Follow up with hematology  4. Recurrent boils Will rx clindamycin gel Meds ordered this encounter  Medications   clindamycin (CLINDAGEL) 1 % gel    Sig: Apply topically 2 (two) times daily.    Dispense:  30 g    Refill:  1    Order Specific Question:   Supervising Provider    Answer:   EURE, LUTHER H [[id number]]     5. Menorrhagia with regular cycle She declines OCs, or lysteda  Call if changes mind   6. Menstrual cramps

## 2021-07-27 LAB — CYTOLOGY - PAP
Comment: NEGATIVE
Diagnosis: UNDETERMINED — AB
High risk HPV: NEGATIVE

## 2021-07-29 ENCOUNTER — Encounter: Payer: Self-pay | Admitting: Adult Health

## 2021-07-29 DIAGNOSIS — R87610 Atypical squamous cells of undetermined significance on cytologic smear of cervix (ASC-US): Secondary | ICD-10-CM

## 2021-07-29 HISTORY — DX: Atypical squamous cells of undetermined significance on cytologic smear of cervix (ASC-US): R87.610

## 2021-08-26 ENCOUNTER — Other Ambulatory Visit: Payer: Self-pay | Admitting: Allergy & Immunology

## 2021-08-27 NOTE — Telephone Encounter (Signed)
Patient is due back for an office visit 06/06/2022

## 2021-08-28 ENCOUNTER — Encounter: Payer: Self-pay | Admitting: Allergy & Immunology

## 2021-08-29 ENCOUNTER — Ambulatory Visit (INDEPENDENT_AMBULATORY_CARE_PROVIDER_SITE_OTHER): Payer: BC Managed Care – PPO | Admitting: Family

## 2021-08-29 ENCOUNTER — Other Ambulatory Visit: Payer: Self-pay

## 2021-08-29 ENCOUNTER — Encounter: Payer: Self-pay | Admitting: Family

## 2021-08-29 DIAGNOSIS — Z720 Tobacco use: Secondary | ICD-10-CM

## 2021-08-29 DIAGNOSIS — J4520 Mild intermittent asthma, uncomplicated: Secondary | ICD-10-CM

## 2021-08-29 DIAGNOSIS — J3089 Other allergic rhinitis: Secondary | ICD-10-CM

## 2021-08-29 DIAGNOSIS — T782XXD Anaphylactic shock, unspecified, subsequent encounter: Secondary | ICD-10-CM

## 2021-08-29 DIAGNOSIS — J019 Acute sinusitis, unspecified: Secondary | ICD-10-CM

## 2021-08-29 DIAGNOSIS — J302 Other seasonal allergic rhinitis: Secondary | ICD-10-CM

## 2021-08-29 MED ORDER — AZITHROMYCIN 250 MG PO TABS: ORAL_TABLET | ORAL | 0 refills | Status: DC

## 2021-08-29 MED ORDER — PREDNISONE 10 MG PO TABS: ORAL_TABLET | ORAL | 0 refills | Status: DC

## 2021-08-29 MED ORDER — EPINEPHRINE 0.3 MG/0.3ML IJ SOAJ: 0.3000 mg | INTRAMUSCULAR | 1 refills | Status: DC | PRN

## 2021-08-29 NOTE — Patient Instructions (Addendum)
Acute sinus infection Start azithromycin  250 mg taking 2 tablets on the first day, then 1 tablet once a day for the next 4 days. Prednisone 10 mg taking 1 tablet twice a day for 4 days, then on the fifth day take 1 tablet and stop Please let us know if this is not helping and we will call in a prescription for doxycycline since you are allergic to penicillin  Asthma Continue albuterol 2 puffs once every 4 hours as needed for cough or wheeze You may use albuterol 2 puffs 5-15 minutes before activity to decrease cough or wheeze  Allergic rhinitis Continue allergen avoidance measures directed toward dust mites, cat, dog, cockroach, and tree pollen as listed below Continue Allegra 180 mg once a day as needed for a runny nose or itch. Remember to rotate to a different antihistamine about every 3 months. Some examples of over the counter antihistamines include Zyrtec (cetirizine), Xyzal (levocetirizine), Allegra (fexofenadine), and Claritin (loratidine).  Continue Flonase 2 sprays in each nostril once a day as needed for a stuffy nose.  In the right nostril, point the applicator out toward the right ear. In the left nostril, point the applicator out toward the left ear Continue azelastine 2 sprays in each nostril twice a day as needed for nasal symptoms Consider saline nasal rinses as needed for nasal symptoms. Use this before any medicated nasal sprays for best result   Food allergy Continue to avoid red meats, shellfish, peanuts, and tree nuts.  In case of an allergic reaction, take Benadryl 50 mg every 4 hours, and if life-threatening symptoms occur, inject with EpiPen 0.3 mg.  Tobacco Quit smoking or try to cut down.   Call the clinic if this treatment plan is not working well for you  Follow up in 1 year or sooner if needed.

## 2021-08-29 NOTE — Progress Notes (Signed)
RE: Lindsey Jensen MRN: [id number] DOB: July 16, 1986 Date of Telemedicine Visit: 08/29/2021  Referring provider: Anabel Halon, MD Primary care provider: Anabel Halon, MD  Chief Complaint: Sinus Problem (Patient gave verbal consent to treat and bill insurance for this visit.)   Telemedicine Follow Up Visit via Telephone: I connected with Lindsey Jensen for a follow up on 08/29/21 by telephone and verified that I am speaking with the correct person using two identifiers.   I discussed the limitations, risks, security and privacy concerns of performing an evaluation and management service by telephone and the availability of in person appointments. I also discussed with the patient that there may be a patient responsible charge related to this service. The patient expressed understanding and agreed to proceed.  Patient is at home.  Provider is at the office.  Visit start time: 10:27 AM Visit end time: 11:01 AM Insurance consent/check in by: Suanne Marker Medical consent and medical assistant/nurse: Vonzell Schlatter.  History of Present Illness: She is a 35 y.o. female, who is being followed for mild intermittent asthma, seasonal and perennial allergic rhinitis, tobacco use, and allergy with anaphylaxis due to food.. Her previous allergy office visit was on June 06, 2021 with Thermon Leyland, FNP.   Seasonal and perennial allergic rhinitis is reported as not well controlled with Allegra 180 mg once a day, Flonase nasal spray as needed, azelastine nasal spray as needed, and sinus rinses as needed.  She reports that since last week on Tuesday she has had nasal congestion, postnasal drip at times, thick green nasty rhinorrhea, and headache.  She also mentions that last Tuesday she had a fever.  She tested negative for COVID last Tuesday, Wednesday, and Thursday.  She mentions that her face feels bruised and her teeth hurt.  She is requesting an antibiotic.  She reports that as a child she was told not to take penicillin  and that doxycycline tears up her stomach.  She is requesting a Z-Pak.  Asthma is reported as controlled with albuterol as needed.  She reports a viral cough that is mostly dry and occasionally wet.  She reports the cough is due to a itchy throat.  She denies any wheezing, tightness in her chest, shortness of breath, and nocturnal awakenings due to breathing problems.  Since her last office visit she has not required any systemic steroids or made any trips to the emergency room or urgent care due to breathing problems.  She uses her albuterol inhaler maybe once a month.  She continues to avoid red meats, shellfish, peanuts, and tree nuts without any accidental ingestion.  Since her last office visit she has not had to use her EpiPen.  She mentions that at the last office visit that they talked about sending in a Auvi-Q device but she did not ever get this.  She continues to smoke half a pack per day.  Assessment and Plan: Lindsey Jensen is a 35 y.o. female with: Patient Instructions  Acute sinus infection Start azithromycin  250 mg taking 2 tablets on the first day, then 1 tablet once a day for the next 4 days. Prednisone 10 mg taking 1 tablet twice a day for 4 days, then on the fifth day take 1 tablet and stop Please let us know if this is not helping and we will call in a prescription for doxycycline since you are allergic to penicillin  Asthma Continue albuterol 2 puffs once every 4 hours as needed for cough or wheeze You  may use albuterol 2 puffs 5-15 minutes before activity to decrease cough or wheeze  Allergic rhinitis Continue allergen avoidance measures directed toward dust mites, cat, dog, cockroach, and tree pollen as listed below Continue Allegra 180 mg once a day as needed for a runny nose or itch. Remember to rotate to a different antihistamine about every 3 months. Some examples of over the counter antihistamines include Zyrtec (cetirizine), Xyzal (levocetirizine), Allegra (fexofenadine),  and Claritin (loratidine).  Continue Flonase 2 sprays in each nostril once a day as needed for a stuffy nose.  In the right nostril, point the applicator out toward the right ear. In the left nostril, point the applicator out toward the left ear Continue azelastine 2 sprays in each nostril twice a day as needed for nasal symptoms Consider saline nasal rinses as needed for nasal symptoms. Use this before any medicated nasal sprays for best result   Food allergy Continue to avoid red meats, shellfish, peanuts, and tree nuts.  In case of an allergic reaction, take Benadryl 50 mg every 4 hours, and if life-threatening symptoms occur, inject with EpiPen 0.3 mg.  Tobacco Quit smoking or try to cut down.   Call the clinic if this treatment plan is not working well for you  Follow up in 1 year or sooner if needed.  Return in about 1 year (around 08/29/2022), or if symptoms worsen or fail to improve.  Meds ordered this encounter  Medications   azithromycin (ZITHROMAX) 250 MG tablet    Sig: Take 2 tablets on the first day, then 1 tablet once a day for the next 4 days then stop    Dispense:  6 each    Refill:  0   predniSONE (DELTASONE) 10 MG tablet    Sig: Take 1 tablet twice a day for 4 days, then on the fourth day take 1 tablet and stop    Dispense:  9 tablet    Refill:  0   EPINEPHrine (AUVI-Q) 0.3 mg/0.3 mL IJ SOAJ injection    Sig: Inject 0.3 mg into the muscle as needed for anaphylaxis.    Dispense:  1 each    Refill:  1   Lab Orders  No laboratory test(s) ordered today    Diagnostics: None.  Medication List:  Current Outpatient Medications  Medication Sig Dispense Refill   azelastine (ASTELIN) 0.1 % nasal spray PLACE 2 SPRAYS INTO BOTH NOSTRILS 2 (TWO) TIMES DAILY. 90 mL 1   azithromycin (ZITHROMAX) 250 MG tablet Take 2 tablets on the first day, then 1 tablet once a day for the next 4 days then stop 6 each 0   Cholecalciferol (VITAMIN D) 50 MCG (2000 UT) tablet Take 2,000 Units  by mouth daily.     clindamycin (CLINDAGEL) 1 % gel Apply topically 2 (two) times daily. 30 g 1   EPINEPHrine (AUVI-Q) 0.3 mg/0.3 mL IJ SOAJ injection Inject 0.3 mg into the muscle as needed for anaphylaxis. 1 each 1   fexofenadine (ALLEGRA) 180 MG tablet Take 180 mg by mouth daily.     fluticasone (FLONASE) 50 MCG/ACT nasal spray Place 2 sprays into both nostrils daily. 16 g 5   levalbuterol (XOPENEX HFA) 45 MCG/ACT inhaler TAKE 2 PUFFS BY MOUTH EVERY 6 HOURS AS NEEDED FOR WHEEZE OR SHORTNESS OF BREATH 15 each 12   predniSONE (DELTASONE) 10 MG tablet Take 1 tablet twice a day for 4 days, then on the fourth day take 1 tablet and stop 9 tablet 0  No current facility-administered medications for this visit.   Allergies: Allergies  Allergen Reactions   Beef-Derived Products Anaphylaxis    Alpha-Gal Allergy    Dairy Aid [Lactase] Other (See Comments)    SOB   Meat [Alpha-Gal] Palpitations    All mammal. Symptom asthma .   Pork-Derived Products Anaphylaxis    Alpha-Gal Allergy   Shellfish Allergy Other (See Comments)    SOB   Other Rash and Itching   I reviewed her past medical history, social history, family history, and environmental history and no significant changes have been reported from previous visit on June 06, 2021.  Review of Systems  Constitutional:  Negative for chills and fever.       Denies fever and chills right now, but reports that she not had a fever since last Wednesday  HENT:         Reports green thick nasty rhinorrhea, nasal congestion, and postnasal drip at times.  Also reports that her teeth hurt and her face feels bruised.  Eyes:        Denies itchy watery eyes  Respiratory:  Positive for cough. Negative for chest tightness, shortness of breath and wheezing.        Reports a viral itchy throat cough that can be wet at times, but mostly dry  Cardiovascular:  Negative for chest pain and palpitations.  Allergic/Immunologic: Positive for environmental allergies  and food allergies.  Neurological:  Positive for headaches.  Objective: Physical Exam Not obtained as encounter was done via telephone.   Previous notes and tests were reviewed.  I discussed the assessment and treatment plan with the patient. The patient was provided an opportunity to ask questions and all were answered. The patient agreed with the plan and demonstrated an understanding of the instructions.   The patient was advised to call back or seek an in-person evaluation if the symptoms worsen or if the condition fails to improve as anticipated.  I provided 34 minutes of non-face-to-face time during this encounter.  It was my pleasure to participate in Lindsey Jensen's care today. Please feel free to contact me with any questions or concerns.   Sincerely,  Nehemiah Settle, FNP [facility]

## 2021-10-02 ENCOUNTER — Other Ambulatory Visit: Payer: Self-pay

## 2021-10-02 ENCOUNTER — Inpatient Hospital Stay (HOSPITAL_COMMUNITY): Payer: BC Managed Care – PPO | Attending: Hematology

## 2021-10-02 ENCOUNTER — Other Ambulatory Visit (HOSPITAL_COMMUNITY): Payer: BC Managed Care – PPO

## 2021-10-02 DIAGNOSIS — F17210 Nicotine dependence, cigarettes, uncomplicated: Secondary | ICD-10-CM | POA: Insufficient documentation

## 2021-10-02 DIAGNOSIS — D751 Secondary polycythemia: Secondary | ICD-10-CM | POA: Insufficient documentation

## 2021-10-02 LAB — COMPREHENSIVE METABOLIC PANEL
ALT: 17 U/L (ref 0–44)
AST: 20 U/L (ref 15–41)
Albumin: 4 g/dL (ref 3.5–5.0)
Alkaline Phosphatase: 76 U/L (ref 38–126)
Anion gap: 7 (ref 5–15)
BUN: 11 mg/dL (ref 6–20)
CO2: 26 mmol/L (ref 22–32)
Calcium: 8.8 mg/dL — ABNORMAL LOW (ref 8.9–10.3)
Chloride: 101 mmol/L (ref 98–111)
Creatinine, Ser: 0.61 mg/dL (ref 0.44–1.00)
GFR, Estimated: >60 mL/min (ref >60)
Glucose, Bld: 94 mg/dL (ref 70–99)
Potassium: 4.3 mmol/L (ref 3.5–5.1)
Sodium: 134 mmol/L — ABNORMAL LOW (ref 135–145)
Total Bilirubin: 1 mg/dL (ref 0.3–1.2)
Total Protein: 7.5 g/dL (ref 6.5–8.1)

## 2021-10-02 LAB — CBC WITH DIFFERENTIAL/PLATELET
Abs Immature Granulocytes: 0.01 10*3/uL (ref 0.00–0.07)
Basophils Absolute: 0.1 10*3/uL (ref 0.0–0.1)
Basophils Relative: 1 %
Eosinophils Absolute: 0.2 10*3/uL (ref 0.0–0.5)
Eosinophils Relative: 2 %
HCT: 47.4 % — ABNORMAL HIGH (ref 36.0–46.0)
Hemoglobin: 15.1 g/dL — ABNORMAL HIGH (ref 12.0–15.0)
Immature Granulocytes: 0 %
Lymphocytes Relative: 28 %
Lymphs Abs: 2 10*3/uL (ref 0.7–4.0)
MCH: 29.7 pg (ref 26.0–34.0)
MCHC: 31.9 g/dL (ref 30.0–36.0)
MCV: 93.1 fL (ref 80.0–100.0)
Monocytes Absolute: 0.5 10*3/uL (ref 0.1–1.0)
Monocytes Relative: 8 %
Neutro Abs: 4.2 10*3/uL (ref 1.7–7.7)
Neutrophils Relative %: 61 %
Platelets: 215 10*3/uL (ref 150–400)
RBC: 5.09 MIL/uL (ref 3.87–5.11)
RDW: 16.2 % — ABNORMAL HIGH (ref 11.5–15.5)
WBC: 6.9 10*3/uL (ref 4.0–10.5)
nRBC: 0 % (ref 0.0–0.2)

## 2021-10-07 NOTE — Progress Notes (Signed)
Lindsey Jensen, [place] [address]   CLINIC:  Medical Oncology/Hematology  PCP:  Lindsey Spar, MD [address] / [place] [address]  [phone]  REASON FOR VISIT:  Follow-up for erythrocytosis  PRIOR THERAPY: none  CURRENT THERAPY: phlebotomy every 4 weeks  INTERVAL HISTORY:  [name], a 35 y.o. female, returns for routine follow-up for Lindsey Jensen erythrocytosis. Lindsey Jensen was last seen on 06/04/2021.  Today Lindsey Jensen reports feeling good. Lindsey Jensen reports increased anxiety. Lindsey Jensen has had 3 phlebotomies since Lindsey Jensen last visit, and Lindsey Jensen denies light-headedness following these phlebotomies. Lindsey Jensen is still currently smoking but has cut back to 1/2 ppd. Lindsey Jensen reports drinking less than 40 ounces of water daily. Lindsey Jensen denies itching after taking showers. Lindsey Jensen reports occasionally Lindsey Jensen knuckles will turn red, and the redness in Lindsey Jensen eyes has improved.   REVIEW OF SYSTEMS:  Review of Systems  Constitutional:  Negative for appetite change and fatigue (75%).  Musculoskeletal:  Positive for arthralgias (3/10 shoulder).  Skin:  Negative for itching.  Neurological:  Positive for headaches. Negative for light-headedness.  Psychiatric/Behavioral:  The patient is nervous/anxious.   All other systems reviewed and are negative.  PAST MEDICAL/SURGICAL HISTORY:  Past Medical History:  Diagnosis Date   Allergy    Phreesia 05/26/2020   Anxiety    Phreesia 05/26/2020   ASCUS of cervix with negative high risk HPV 07/29/2021   07/28/21 repeat in 3 years per [facility], 5 year risk for CIN3+ is 0.40%   Asthma    Phreesia 05/26/2020   Contraceptive education 01/26/2014   Erythrocytosis    Medical history non-contributory    Moderate persistent asthma, uncomplicated 0/76/2263   Past Surgical History:  Procedure Laterality Date   NO PAST SURGERIES      SOCIAL HISTORY:  Social History   Socioeconomic History   Marital status: Married    Spouse name: Not on file   Number of children: 1    Years of education: Not on file   Highest education level: Bachelor's degree (e.g., BA, AB, BS)  Occupational History    Employer: Building surveyor  Tobacco Use   Smoking status: Every Day    Packs/day: 0.50    Years: 14.00    Pack years: 7.00    Types: Cigarettes   Smokeless tobacco: Never  Vaping Use   Vaping Use: Never used  Substance and Sexual Activity   Alcohol use: Yes    Alcohol/week: 14.0 standard drinks    Types: 14 Glasses of wine per week    Comment: glass of wine every night     Drug use: No   Sexual activity: Yes    Birth control/protection: None, Other-see comments    Comment: partner has vasectomy  Other Topics Concern   Not on file  Social History Narrative   Lives with husband and son      Dog: Pitbull: Lana       Enjoys: reading-reformed theology; fiction; drink wine, buy shoes, playing cards      Diet: eats all food groups outside of meat-some chicken at times   Caffeine: 3 cups of expresso daily    Water: 24 oz x 3 bottles daily       Wears seat belt   Does not use phone while driving   Art therapist in a lock box   Social Determinants of Health   Financial Resource Strain: Low Risk    Difficulty  of Paying Living Expenses: Not very hard  Food Insecurity: No Food Insecurity   Worried About [place] in the Last Year: Never true   Ran Out of Food in the Last Year: Never true  Transportation Needs: No Transportation Needs   Lack of Transportation (Medical): No   Lack of Transportation (Non-Medical): No  Physical Activity: Insufficiently Active   Days of Exercise per Week: 3 days   Minutes of Exercise per Session: 30 min  Stress: Stress Concern Present   Feeling of Stress : Very much  Social Connections: Moderately Isolated   Frequency of Communication with Friends and Family: More than three times a week   Frequency of Social Gatherings with Friends and Family: Once a week   Attends Religious  Services: Never   Marine scientist or Organizations: No   Attends Music therapist: Never   Marital Status: Married  Human resources officer Violence: Not At Risk   Fear of Current or Ex-Partner: No   Emotionally Abused: No   Physically Abused: No   Sexually Abused: No    FAMILY HISTORY:  Family History  Problem Relation Age of Onset   Heart disease Father        heart attack   Allergic rhinitis Father    Diabetes Mother    Depression Mother    Anxiety disorder Mother    Heart disease Mother    Heart disease Maternal Grandmother        CHF   Asthma Maternal Grandmother    Diabetes Maternal Grandmother    High Cholesterol Sister    Kidney Stones Brother    Alzheimer's disease Maternal Grandfather    Asthma Maternal Grandfather    Diabetes Maternal Uncle    Angioedema Neg Hx    Immunodeficiency Neg Hx     CURRENT MEDICATIONS:  Current Outpatient Medications  Medication Sig Dispense Refill   azelastine (ASTELIN) 0.1 % nasal spray PLACE 2 SPRAYS INTO BOTH NOSTRILS 2 (TWO) TIMES DAILY. 90 mL 1   azithromycin (ZITHROMAX) 250 MG tablet Take 2 tablets on the first day, then 1 tablet once a day for the next 4 days then stop 6 each 0   Cholecalciferol (VITAMIN D) 50 MCG (2000 UT) tablet Take 2,000 Units by mouth daily.     clindamycin (CLINDAGEL) 1 % gel Apply topically 2 (two) times daily. 30 g 1   EPINEPHrine (AUVI-Q) 0.3 mg/0.3 mL IJ SOAJ injection Inject 0.3 mg into the muscle as needed for anaphylaxis. 1 each 1   fexofenadine (ALLEGRA) 180 MG tablet Take 180 mg by mouth daily.     fluticasone (FLONASE) 50 MCG/ACT nasal spray Place 2 sprays into both nostrils daily. 16 g 5   levalbuterol (XOPENEX HFA) 45 MCG/ACT inhaler TAKE 2 PUFFS BY MOUTH EVERY 6 HOURS AS NEEDED FOR WHEEZE OR SHORTNESS OF BREATH 15 each 12   predniSONE (DELTASONE) 10 MG tablet Take 1 tablet twice a day for 4 days, then on the fourth day take 1 tablet and stop 9 tablet 0   No current  facility-administered medications for this visit.    ALLERGIES:  Allergies  Allergen Reactions   Beef-Derived Products Anaphylaxis    Alpha-Gal Allergy    Dairy Aid [Lactase] Other (See Comments)    SOB   Meat [Alpha-Gal] Palpitations    All mammal. Symptom asthma .   Pork-Derived Products Anaphylaxis    Alpha-Gal Allergy   Shellfish Allergy Other (See Comments)    SOB  Other Rash and Itching    PHYSICAL EXAM:  Performance status (ECOG): 0 - Asymptomatic  There were no vitals filed for this visit. Wt Readings from Last 3 Encounters:  07/21/21 179 lb (81.2 kg)  06/06/21 176 lb 6.4 oz (80 kg)  06/04/21 175 lb 11.2 oz (79.7 kg)   Physical Exam Vitals reviewed.  Constitutional:      Appearance: Normal appearance.  Cardiovascular:     Rate and Rhythm: Normal rate and regular rhythm.     Pulses: Normal pulses.     Heart sounds: Normal heart sounds.  Pulmonary:     Effort: Pulmonary effort is normal.     Breath sounds: Normal breath sounds.  Abdominal:     Palpations: Abdomen is soft. There is no hepatomegaly, splenomegaly or mass.     Tenderness: There is no abdominal tenderness.  Neurological:     General: No focal deficit present.     Mental Status: Lindsey Jensen is alert and oriented to person, place, and time.  Psychiatric:        Mood and Affect: Mood normal.        Behavior: Behavior normal.    LABORATORY DATA:  I have reviewed the labs as listed.  CBC Latest Ref Rng & Units 10/02/2021 06/02/2021 04/29/2021  WBC 4.0 - 10.5 K/uL 6.9 7.3 5.8  Hemoglobin 12.0 - 15.0 g/dL 15.1(H) 16.5(H) 20.1(HH)  Hematocrit 36.0 - 46.0 % 47.4(H) 51.1(H) 58.5(H)  Platelets 150 - 400 K/uL 215 153 137(L)   CMP Latest Ref Rng & Units 10/02/2021 06/02/2021 04/29/2021  Glucose 70 - 99 mg/dL 94 93 87  BUN 6 - 20 mg/dL _0 Creatinine 0.44 - 1.00 mg/dL 0.61 0.65 0.82  Sodium 135 - 145 mmol/L 134(L) 133(L) 140  Potassium 3.5 - 5.1 mmol/L 4.3 4.2 4.3  Chloride 98 - 111 mmol/L 101 101 99   CO2 22 - 32 mmol/L _1 Calcium 8.9 - 10.3 mg/dL 8.8(L) 8.6(L) 9.3  Total Protein 6.5 - 8.1 g/dL 7.5 6.7 7.3  Total Bilirubin 0.3 - 1.2 mg/dL 1.0 0.9 0.8  Alkaline Phos 38 - 126 U/L 76 60 92  AST 15 - 41 U/L _2 ALT 0 - 44 U/L _3 Component Value Date/Time   RBC 5.09 10/02/2021 1529   MCV 93.1 10/02/2021 1529   MCV 95 04/29/2021 0850   MCH 29.7 10/02/2021 1529   MCHC 31.9 10/02/2021 1529   RDW 16.2 (H) 10/02/2021 1529   RDW 14.2 04/29/2021 0850   LYMPHSABS 2.0 10/02/2021 1529   LYMPHSABS 1.6 04/29/2021 0850   MONOABS 0.5 10/02/2021 1529   EOSABS 0.2 10/02/2021 1529   EOSABS 0.2 04/29/2021 0850   BASOSABS 0.1 10/02/2021 1529   BASOSABS 0.1 04/29/2021 0850    DIAGNOSTIC IMAGING:  I have independently reviewed the scans and discussed with the patient. No results found.   ASSESSMENT:  1.  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  Carboxyhemoglobin 4.2 (0.5-1.5) -Positive erythromelalgia. -Erythropoietin 12.7. -Phlebotomy on 02/19/2020.  Lindsey Jensen felt better after that with improvement in erythromelalgia.  However Lindsey Jensen started to have the symptoms back in the past few days. -We reviewed results from 03/05/2020, hemoglobin was 18.9, hematocrit was 59.4. -CALR and MPL mutation were negative. -Echo on 03/05/2020 shows EF 60 to 65% with normal function.  Atria were normal. -Ultrasound of the abdomen on 04/05/2020 did not show any cystic lesions in the  liver or kidneys.  Spleen size was normal. - Lindsey Jensen has declined bone marrow biopsy in the past.   PLAN:  1.  JAK2 negative erythrocytosis: - Since last visit in June, Lindsey Jensen had phlebotomies end of June, July and August. - Lindsey Jensen tolerated them very well. - Lindsey Jensen reported improvement in conjunctival erythema. - Lindsey Jensen continues to have some redness of the fingers when Lindsey Jensen eats hot foods.  No aquagenic pruritus reported.  No prior history of thrombosis. - Reviewed labs from 10/02/2021.   Hemoglobin improved to 15.1 and hematocrit 47.4.  LFTs are within normal limits. - Recommend maintenance phase of phlebotomy with 1 phlebotomy every 6 to 8 weeks. - RTC 6 months for follow-up.     2.  Tobacco use: - Lindsey Jensen has cut back on smoking.  Lindsey Jensen is currently smoking half pack per day.  Previously 3/4 pack/day.  Orders placed this encounter:  No orders of the defined types were placed in this encounter.    Derek Jack, MD [place] [phone]   I, Thana Ates, am acting as a scribe for Dr. Derek Jack.  I, Derek Jack MD, have reviewed the above documentation for accuracy and completeness, and I agree with the above.

## 2021-10-08 ENCOUNTER — Inpatient Hospital Stay (HOSPITAL_BASED_OUTPATIENT_CLINIC_OR_DEPARTMENT_OTHER): Payer: BC Managed Care – PPO | Admitting: Hematology

## 2021-10-08 ENCOUNTER — Other Ambulatory Visit: Payer: Self-pay

## 2021-10-08 VITALS — BP 134/81 | HR 94 | Temp 97.7°F | Resp 18 | Wt 176.4 lb

## 2021-10-08 DIAGNOSIS — F17210 Nicotine dependence, cigarettes, uncomplicated: Secondary | ICD-10-CM | POA: Diagnosis not present

## 2021-10-08 DIAGNOSIS — D751 Secondary polycythemia: Secondary | ICD-10-CM

## 2021-10-08 NOTE — Patient Instructions (Signed)
[facility] at [facility] Discharge Instructions  You were seen and examined today by Dr. Ellin Saba. He reviewed your most recent labs and they are looking good. He recommends that you start getting your phlebotomies less often. He recommends every 6-8 weeks as needed to help maintain your hemoglobin levels. Please keep follow up as scheduled.   Thank you for choosing [facility] at [facility] to provide your oncology and hematology care.  To afford each patient quality time with our provider, please arrive at least 15 minutes before your scheduled appointment time.   If you have a lab appointment with the Cancer Center please come in thru the Main Entrance and check in at the main information desk.  You need to re-schedule your appointment should you arrive 10 or more minutes late.  We strive to give you quality time with our providers, and arriving late affects you and other patients whose appointments are after yours.  Also, if you no show three or more times for appointments you may be dismissed from the clinic at the providers discretion.     Again, thank you for choosing [facility].  Our hope is that these requests will decrease the amount of time that you wait before being seen by our physicians.       _____________________________________________________________  Should you have questions after your visit to [facility], please contact our office at [phone] and follow the prompts.  Our office hours are 8:00 a.m. and 4:30 p.m. Monday - Friday.  Please note that voicemails left after 4:00 p.m. may not be returned until the following business day.  We are closed weekends and major holidays.  You do have access to a nurse 24-7, just call the main number to the clinic [phone] and do not press any options, hold on the line and a nurse will answer the phone.    For prescription refill requests, have your pharmacy  contact our office and allow 72 hours.    Due to Covid, you will need to wear a mask upon entering the hospital. If you do not have a mask, a mask will be given to you at the Main Entrance upon arrival. For doctor visits, patients may have 1 support person age 26 or older with them. For treatment visits, patients can not have anyone with them due to social distancing guidelines and our immunocompromised population.

## 2021-10-09 ENCOUNTER — Ambulatory Visit (HOSPITAL_COMMUNITY): Payer: BC Managed Care – PPO | Admitting: Hematology

## 2021-11-07 ENCOUNTER — Ambulatory Visit: Payer: BC Managed Care – PPO | Admitting: Family Medicine

## 2021-11-07 ENCOUNTER — Other Ambulatory Visit: Payer: Self-pay

## 2021-11-07 ENCOUNTER — Encounter: Payer: Self-pay | Admitting: Family Medicine

## 2021-11-07 DIAGNOSIS — Z20828 Contact with and (suspected) exposure to other viral communicable diseases: Secondary | ICD-10-CM | POA: Diagnosis not present

## 2021-11-07 DIAGNOSIS — J0100 Acute maxillary sinusitis, unspecified: Secondary | ICD-10-CM | POA: Diagnosis not present

## 2021-11-07 DIAGNOSIS — J209 Acute bronchitis, unspecified: Secondary | ICD-10-CM

## 2021-11-07 DIAGNOSIS — J019 Acute sinusitis, unspecified: Secondary | ICD-10-CM | POA: Insufficient documentation

## 2021-11-07 MED ORDER — AZITHROMYCIN 250 MG PO TABS: ORAL_TABLET | ORAL | 0 refills | Status: AC

## 2021-11-07 NOTE — Assessment & Plan Note (Signed)
Z PACK

## 2021-11-07 NOTE — Assessment & Plan Note (Signed)
Z PACK AND SHORT COURSE OF PREDNISONE WHICH SHE ALREADY HAS

## 2021-11-07 NOTE — Progress Notes (Signed)
Virtual Visit via Telephone Note  I connected with Lindsey Jensen on 11/07/21 at  8:00 AM EST by telephone and verified that I am speaking with the correct person using two identifiers.  Location: Patient: HOME Provider: OFFICE   I discussed the limitations, risks, security and privacy concerns of performing an evaluation and management service by telephone and the availability of in person appointments. I also discussed with the patient that there may be a patient responsible charge related to this service. The patient expressed understanding and agreed to proceed.   History of Present Illness: MAXILLARY SINUS PRESSURE WITH GREEN DRAINAGE AND COUGH PTRODUCTIVE OF GREEN SPUTUM SINCE 11/14, FEVER X 2 DAYS, MOST RECENT WAS YESTREDAY TO 100 WHEEZING THIS MORNING SON POSITIVE FOR FLU 6 DAYS AGO   Observations/Objective: There were no vitals taken for this visit. Good communication with no confusion and intact memory. Alert and oriented x 3 No signs of respiratory distress during speech    Assessment and Plan: Acute bronchitis Z PACK AND SHORT COURSE OF PREDNISONE WHICH SHE ALREADY HAS  Acute sinusitis Z PACK  Exposure to influenza IMPROVING SYMPTOMS SYMPTOMATIC TREATMENT ONLY   Follow Up Instructions:    I discussed the assessment and treatment plan with the patient. The patient was provided an opportunity to ask questions and all were answered. The patient agreed with the plan and demonstrated an understanding of the instructions.   The patient was advised to call back or seek an in-person evaluation if the symptoms worsen or if the condition fails to improve as anticipated.  I provided 13 minutes of non-face-to-face time during this encounter.   Syliva Overman, MD

## 2021-11-07 NOTE — Assessment & Plan Note (Signed)
IMPROVING SYMPTOMS SYMPTOMATIC TREATMENT ONLY

## 2021-11-07 NOTE — Patient Instructions (Signed)
F/u AS BEFORE, CALL IF YOU NEED TO BE SEEN SOONER  NURSE VISIT IN MID DECEMBER FOR FLU AND PNEUMONIA 20 VACCINES  YOU ARE TREATED FOR ACUTE SINUSITIS AND BRONCHITIS, Z PACK IS PRESCRIBED, AND I RECOMMEND PREDNISONE 10 MG TWICE DAILY FOR THE NEXT 3 TO 5 DAYS  Thanks for choosing  Primary Care, we consider it a privelige to serve you.

## 2022-01-06 ENCOUNTER — Other Ambulatory Visit: Payer: Self-pay | Admitting: *Deleted

## 2022-01-06 MED ORDER — LEVALBUTEROL TARTRATE 45 MCG/ACT IN AERO: INHALATION_SPRAY | RESPIRATORY_TRACT | 1 refills | Status: DC

## 2022-01-14 ENCOUNTER — Other Ambulatory Visit (HOSPITAL_COMMUNITY): Payer: Self-pay | Admitting: *Deleted

## 2022-01-14 DIAGNOSIS — D751 Secondary polycythemia: Secondary | ICD-10-CM

## 2022-01-14 DIAGNOSIS — D582 Other hemoglobinopathies: Secondary | ICD-10-CM

## 2022-01-14 NOTE — Progress Notes (Signed)
Patient called and has not been able to have phlebotomy performed since August due to ongoing illness in the family.  Per Dr. Ellin Saba we will order repeat CBC and evaluate what frequency she will need for phlebotomy going forward.

## 2022-01-15 ENCOUNTER — Inpatient Hospital Stay (HOSPITAL_COMMUNITY): Payer: BC Managed Care – PPO | Attending: Hematology

## 2022-01-15 ENCOUNTER — Other Ambulatory Visit: Payer: Self-pay

## 2022-01-15 DIAGNOSIS — D751 Secondary polycythemia: Secondary | ICD-10-CM | POA: Diagnosis not present

## 2022-01-15 DIAGNOSIS — D582 Other hemoglobinopathies: Secondary | ICD-10-CM

## 2022-01-15 LAB — CBC WITH DIFFERENTIAL/PLATELET
Abs Immature Granulocytes: 0.01 10*3/uL (ref 0.00–0.07)
Basophils Absolute: 0.1 10*3/uL (ref 0.0–0.1)
Basophils Relative: 1 %
Eosinophils Absolute: 0.2 10*3/uL (ref 0.0–0.5)
Eosinophils Relative: 2 %
HCT: 51.7 % — ABNORMAL HIGH (ref 36.0–46.0)
Hemoglobin: 16.9 g/dL — ABNORMAL HIGH (ref 12.0–15.0)
Immature Granulocytes: 0 %
Lymphocytes Relative: 27 %
Lymphs Abs: 1.9 10*3/uL (ref 0.7–4.0)
MCH: 30.3 pg (ref 26.0–34.0)
MCHC: 32.7 g/dL (ref 30.0–36.0)
MCV: 92.7 fL (ref 80.0–100.0)
Monocytes Absolute: 0.6 10*3/uL (ref 0.1–1.0)
Monocytes Relative: 8 %
Neutro Abs: 4.3 10*3/uL (ref 1.7–7.7)
Neutrophils Relative %: 62 %
Platelets: 182 10*3/uL (ref 150–400)
RBC: 5.58 MIL/uL — ABNORMAL HIGH (ref 3.87–5.11)
RDW: 18.6 % — ABNORMAL HIGH (ref 11.5–15.5)
WBC: 6.9 10*3/uL (ref 4.0–10.5)
nRBC: 0 % (ref 0.0–0.2)

## 2022-01-16 ENCOUNTER — Telehealth: Payer: Self-pay | Admitting: *Deleted

## 2022-01-16 NOTE — Telephone Encounter (Signed)
Spoke to patient and she would like to do back to back phlebotomy asap, then in 4 weeks and will go to every 8 weeks thereafter.  Order will be sent to one [facility] today.

## 2022-01-29 ENCOUNTER — Encounter (HOSPITAL_COMMUNITY): Payer: Self-pay

## 2022-01-29 NOTE — Progress Notes (Signed)
Patient will have phlebotomy per notes on 1/27

## 2022-02-24 ENCOUNTER — Other Ambulatory Visit: Payer: Self-pay | Admitting: Adult Health

## 2022-02-26 ENCOUNTER — Encounter: Payer: Self-pay | Admitting: Obstetrics & Gynecology

## 2022-02-26 ENCOUNTER — Other Ambulatory Visit: Payer: Self-pay

## 2022-02-26 ENCOUNTER — Ambulatory Visit (INDEPENDENT_AMBULATORY_CARE_PROVIDER_SITE_OTHER): Payer: BC Managed Care – PPO | Admitting: Obstetrics & Gynecology

## 2022-02-26 VITALS — BP 136/90 | HR 88 | Ht 68.0 in | Wt 178.5 lb

## 2022-02-26 DIAGNOSIS — L0293 Carbuncle, unspecified: Secondary | ICD-10-CM | POA: Diagnosis not present

## 2022-02-26 MED ORDER — SILVER SULFADIAZINE 1 % EX CREA: TOPICAL_CREAM | CUTANEOUS | 11 refills | Status: DC

## 2022-02-26 MED ORDER — DOXYCYCLINE HYCLATE 100 MG PO TABS: 100.0000 mg | ORAL_TABLET | Freq: Two times a day (BID) | ORAL | 0 refills | Status: DC

## 2022-02-26 NOTE — Progress Notes (Signed)
? ? ? ? ?Chief Complaint  ?Patient presents with  ? boil on bottom X 1 week  ? ? ? ? ?36 y.o. G1P1001 Patient's last menstrual period was 02/01/2022. The current method of family planning is none. ? ?Outpatient Encounter Medications as of 02/26/2022  ?Medication Sig  ? azelastine (ASTELIN) 0.1 % nasal spray PLACE 2 SPRAYS INTO BOTH NOSTRILS 2 (TWO) TIMES DAILY. (Patient taking differently: Place 2 sprays into both nostrils as needed.)  ? clindamycin (CLINDAGEL) 1 % gel APPLY TOPICALLY TWICE A DAY  ? doxycycline (VIBRA-TABS) 100 MG tablet Take 1 tablet (100 mg total) by mouth 2 (two) times daily.  ? EPINEPHrine (AUVI-Q) 0.3 mg/0.3 mL IJ SOAJ injection Inject 0.3 mg into the muscle as needed for anaphylaxis.  ? fexofenadine (ALLEGRA) 180 MG tablet Take 180 mg by mouth daily.  ? fluticasone (FLONASE) 50 MCG/ACT nasal spray Place 2 sprays into both nostrils daily.  ? levalbuterol (XOPENEX HFA) 45 MCG/ACT inhaler TAKE 2 PUFFS BY MOUTH EVERY 6 HOURS AS NEEDED FOR WHEEZE OR SHORTNESS OF BREATH  ? silver sulfADIAZINE (SILVADENE) 1 % cream Use to are 2-3 times daily  ? [DISCONTINUED] Cholecalciferol (VITAMIN D) 50 MCG (2000 UT) tablet Take 2,000 Units by mouth daily.  ? ?No facility-administered encounter medications on file as of 02/26/2022.  ? ? ?Subjective ?Pt with history of boils, not HS related ?None for a few years ?Started 1 week ago, using topical cleocin cream ?Warm soaks with improvement ? ?Past Medical History:  ?Diagnosis Date  ? Allergy   ? Phreesia 05/26/2020  ? Anxiety   ? Phreesia 05/26/2020  ? ASCUS of cervix with negative high risk HPV 07/29/2021  ? 07/28/21 repeat in 3 years per [facility], 5 year risk for CIN3+ is 0.40%  ? Asthma   ? Phreesia 05/26/2020  ? Contraceptive education 01/26/2014  ? Erythrocytosis   ? Medical history non-contributory   ? Moderate persistent asthma, uncomplicated 05/19/2019  ? ? ?Past Surgical History:  ?Procedure Laterality Date  ? NO PAST SURGERIES    ? ? ?OB History   ? ? Gravida  ?1  ?  Para  ?1  ? Term  ?1  ? Preterm  ?0  ? AB  ?0  ? Living  ?1  ?  ? ? SAB  ?0  ? IAB  ?0  ? Ectopic  ?0  ? Multiple  ?0  ? Live Births  ?1  ?   ?  ?  ? ? ?Allergies  ?Allergen Reactions  ? Beef-Derived Products Anaphylaxis  ?  Alpha-Gal Allergy   ? Dairy Aid [Tilactase] Other (See Comments)  ?  SOB  ? Meat [Alpha-Gal] Palpitations  ?  All mammal. Symptom asthma .  ? Pork-Derived Products Anaphylaxis  ?  Alpha-Gal Allergy  ? Shellfish Allergy Other (See Comments)  ?  SOB  ? Other Rash and Itching  ? ? ?Social History  ? ?Socioeconomic History  ? Marital status: Married  ?  Spouse name: Not on file  ? Number of children: 1  ? Years of education: Not on file  ? Highest education level: Bachelor's degree (e.g., BA, AB, BS)  ?Occupational History  ?  Employer: [employer]  ?Tobacco Use  ? Smoking status: Every Day  ?  Packs/day: 0.50  ?  Years: 14.00  ?  Pack years: 7.00  ?  Types: Cigarettes  ? Smokeless tobacco: Never  ?Vaping Use  ? Vaping Use: Never used  ?Substance and Sexual  Activity  ? Alcohol use: Yes  ?  Alcohol/week: 14.0 standard drinks  ?  Types: 14 Glasses of wine per week  ?  Comment: glass of wine every night    ? Drug use: No  ? Sexual activity: Yes  ?  Birth control/protection: Other-see comments  ?  Comment: partner has vasectomy  ?Other Topics Concern  ? Not on file  ?Social History Narrative  ? Lives with husband and son  ?   ? Dog: Pitbull: Lana   ?   ? Enjoys: reading-reformed theology; fiction; drink wine, buy shoes, playing cards  ?   ? Diet: eats all food groups outside of meat-some chicken at times  ? Caffeine: 3 cups of expresso daily   ? Water: 24 oz x 3 bottles daily   ?   ? Wears seat belt  ? Does not use phone while driving  ? Smoke detectors   ? Weapons in a lock box  ? ?Social Determinants of Health  ? ?Financial Resource Strain: Low Risk   ? Difficulty of Paying Living Expenses: Not very hard  ?Food Insecurity: No Food Insecurity  ? Worried About Brewing technologist in the Last Year: Never true  ? Ran Out of Food in the Last Year: Never true  ?Transportation Needs: No Transportation Needs  ? Lack of Transportation (Medical): No  ? Lack of Transportation (Non-Medical): No  ?Physical Activity: Insufficiently Active  ? Days of Exercise per Week: 3 days  ? Minutes of Exercise per Session: 30 min  ?Stress: Stress Concern Present  ? Feeling of Stress : Very much  ?Social Connections: Moderately Isolated  ? Frequency of Communication with Friends and Family: More than three times a week  ? Frequency of Social Gatherings with Friends and Family: Once a week  ? Attends Religious Services: Never  ? Active Member of Clubs or Organizations: No  ? Attends Banker Meetings: Never  ? Marital Status: Married  ? ? ?Family History  ?Problem Relation Age of Onset  ? Heart disease Father   ?     heart attack  ? Allergic rhinitis Father   ? Diabetes Mother   ? Depression Mother   ? Anxiety disorder Mother   ? Heart disease Mother   ? Heart disease Maternal Grandmother   ?     CHF  ? Asthma Maternal Grandmother   ? Diabetes Maternal Grandmother   ? High Cholesterol Sister   ? Kidney Stones Brother   ? Alzheimer's disease Maternal Grandfather   ? Asthma Maternal Grandfather   ? Diabetes Maternal Uncle   ? Angioedema Neg Hx   ? Immunodeficiency Neg Hx   ? ? ?Medications:       ?Current Outpatient Medications:  ?  azelastine (ASTELIN) 0.1 % nasal spray, PLACE 2 SPRAYS INTO BOTH NOSTRILS 2 (TWO) TIMES DAILY. (Patient taking differently: Place 2 sprays into both nostrils as needed.), Disp: 90 mL, Rfl: 1 ?  clindamycin (CLINDAGEL) 1 % gel, APPLY TOPICALLY TWICE A DAY, Disp: 30 g, Rfl: 1 ?  doxycycline (VIBRA-TABS) 100 MG tablet, Take 1 tablet (100 mg total) by mouth 2 (two) times daily., Disp: 20 tablet, Rfl: 0 ?  EPINEPHrine (AUVI-Q) 0.3 mg/0.3 mL IJ SOAJ injection, Inject 0.3 mg into the muscle as needed for anaphylaxis., Disp: 1 each, Rfl: 1 ?  fexofenadine (ALLEGRA) 180 MG tablet,  Take 180 mg by mouth daily., Disp: , Rfl:  ?  fluticasone (FLONASE) 50 MCG/ACT nasal  spray, Place 2 sprays into both nostrils daily., Disp: 16 g, Rfl: 5 ?  levalbuterol (XOPENEX HFA) 45 MCG/ACT inhaler, TAKE 2 PUFFS BY MOUTH EVERY 6 HOURS AS NEEDED FOR WHEEZE OR SHORTNESS OF BREATH, Disp: 15 g, Rfl: 1 ?  silver sulfADIAZINE (SILVADENE) 1 % cream, Use to are 2-3 times daily, Disp: 50 g, Rfl: 11 ? ?Objective ?Blood pressure 136/90, pulse 88, height 5\' 8"  (1.727 m), weight 178 lb 8 oz (81 kg), last menstrual period 02/01/2022. ? ?Well circumscribed small boil on right buttock ?Minimal erythema no warmth ? ?Pertinent ROS ?No burning with urination, frequency or urgency ?No nausea, vomiting or diarrhea ?Nor fever chills or other constitutional symptoms ? ? ?Labs or studies ?No new ? ? ? ?Impression ?Diagnoses this Encounter:: ?  ICD-10-CM   ?1. Recurrent boils  L02.93   ?  ? ? ?Established relevant diagnosis(es): ? ? ?Plan/Recommendations: ?Meds ordered this encounter  ?Medications  ? silver sulfADIAZINE (SILVADENE) 1 % cream  ?  Sig: Use to are 2-3 times daily  ?  Dispense:  50 g  ?  Refill:  11  ? doxycycline (VIBRA-TABS) 100 MG tablet  ?  Sig: Take 1 tablet (100 mg total) by mouth 2 (two) times daily.  ?  Dispense:  20 tablet  ?  Refill:  0  ? ? ?Labs or Scans Ordered: ?No orders of the defined types were placed in this encounter. ? ? ?Management:: ?As above ? ?Follow up ?Return in about 1 week (around 03/05/2022) for Follow up, with Dr Despina HiddenEure. ? ? ? ? ? All questions were answered. ? ? ? ?

## 2022-02-27 ENCOUNTER — Other Ambulatory Visit: Payer: Self-pay | Admitting: Family Medicine

## 2022-03-05 ENCOUNTER — Ambulatory Visit: Payer: BC Managed Care – PPO | Admitting: Obstetrics & Gynecology

## 2022-03-26 DIAGNOSIS — L0232 Furuncle of buttock: Secondary | ICD-10-CM | POA: Diagnosis not present

## 2022-03-26 DIAGNOSIS — D225 Melanocytic nevi of trunk: Secondary | ICD-10-CM | POA: Diagnosis not present

## 2022-03-26 DIAGNOSIS — Z1283 Encounter for screening for malignant neoplasm of skin: Secondary | ICD-10-CM | POA: Diagnosis not present

## 2022-04-02 ENCOUNTER — Inpatient Hospital Stay (HOSPITAL_COMMUNITY): Payer: BC Managed Care – PPO | Attending: Hematology

## 2022-04-02 DIAGNOSIS — D751 Secondary polycythemia: Secondary | ICD-10-CM | POA: Diagnosis not present

## 2022-04-02 DIAGNOSIS — F17210 Nicotine dependence, cigarettes, uncomplicated: Secondary | ICD-10-CM | POA: Diagnosis not present

## 2022-04-02 LAB — CBC WITH DIFFERENTIAL/PLATELET
Abs Immature Granulocytes: 0 10*3/uL (ref 0.00–0.07)
Basophils Absolute: 0.1 10*3/uL (ref 0.0–0.1)
Basophils Relative: 1 %
Eosinophils Absolute: 0.2 10*3/uL (ref 0.0–0.5)
Eosinophils Relative: 3 %
HCT: 49.7 % — ABNORMAL HIGH (ref 36.0–46.0)
Hemoglobin: 16.1 g/dL — ABNORMAL HIGH (ref 12.0–15.0)
Immature Granulocytes: 0 %
Lymphocytes Relative: 30 %
Lymphs Abs: 1.9 10*3/uL (ref 0.7–4.0)
MCH: 30.4 pg (ref 26.0–34.0)
MCHC: 32.4 g/dL (ref 30.0–36.0)
MCV: 93.8 fL (ref 80.0–100.0)
Monocytes Absolute: 0.6 10*3/uL (ref 0.1–1.0)
Monocytes Relative: 9 %
Neutro Abs: 3.7 10*3/uL (ref 1.7–7.7)
Neutrophils Relative %: 57 %
Platelets: 174 10*3/uL (ref 150–400)
RBC: 5.3 MIL/uL — ABNORMAL HIGH (ref 3.87–5.11)
RDW: 15.8 % — ABNORMAL HIGH (ref 11.5–15.5)
WBC: 6.4 10*3/uL (ref 4.0–10.5)
nRBC: 0 % (ref 0.0–0.2)

## 2022-04-02 LAB — COMPREHENSIVE METABOLIC PANEL
ALT: 16 U/L (ref 0–44)
AST: 16 U/L (ref 15–41)
Albumin: 4 g/dL (ref 3.5–5.0)
Alkaline Phosphatase: 66 U/L (ref 38–126)
Anion gap: 5 (ref 5–15)
BUN: 14 mg/dL (ref 6–20)
CO2: 27 mmol/L (ref 22–32)
Calcium: 8.9 mg/dL (ref 8.9–10.3)
Chloride: 104 mmol/L (ref 98–111)
Creatinine, Ser: 0.67 mg/dL (ref 0.44–1.00)
GFR, Estimated: >60 mL/min (ref >60)
Glucose, Bld: 98 mg/dL (ref 70–99)
Potassium: 4.2 mmol/L (ref 3.5–5.1)
Sodium: 136 mmol/L (ref 135–145)
Total Bilirubin: 0.7 mg/dL (ref 0.3–1.2)
Total Protein: 7.3 g/dL (ref 6.5–8.1)

## 2022-04-09 ENCOUNTER — Inpatient Hospital Stay (HOSPITAL_BASED_OUTPATIENT_CLINIC_OR_DEPARTMENT_OTHER): Payer: BC Managed Care – PPO | Admitting: Hematology

## 2022-04-09 DIAGNOSIS — D751 Secondary polycythemia: Secondary | ICD-10-CM | POA: Diagnosis not present

## 2022-04-09 NOTE — Progress Notes (Signed)
Virtual Visit via Telephone Note ? ?I connected with Lindsey Jensen on 04/09/22 at  3:15 PM EDT by telephone and verified that I am speaking with the correct person using two identifiers. ? ?Location: ?Patient: At home ?Provider: In the office ?  ?I discussed the limitations, risks, security and privacy concerns of performing an evaluation and management service by telephone and the availability of in person appointments. I also discussed with the patient that there may be a patient responsible charge related to this service. The patient expressed understanding and agreed to proceed. ? ? 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  Serum EPO was 12.7.  She is being treated with intermittent phlebotomies. ?  ?Observations/Objective: ?Her last phlebotomy was on 01/27/2022.  Prior to that it was on 08/16/2021.  She reports that her symptoms including redness in the eyes and fingers have slightly improved.  She never had aquagenic pruritus.  No prior history of thrombosis.  She is continuing to smoke about half pack of cigarettes per day. ? ?Assessment and Plan: ? ?1.  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  Symptoms are better at this time. ?- She reports that her symptoms also get worse when she has panic episodes and anxiety.  She is planning to talk to her behavioral therapist. ?- Recommend phlebotomy every 8 weeks or when needed based on symptoms.  She is planning to go to [place] to visit her family in June.  She will have a phlebotomy prior to her trip. ?- I have recommended follow-up in 6 months.  We will plan to check ferritin, B12 and folic acid along with CBC at that time. ? ? ?Follow Up Instructions: ?RTC 6 months for follow-up. ?  ?I discussed the assessment and treatment plan with the patient. The patient was provided an opportunity to ask questions and all were answered. The patient agreed with the plan and demonstrated an  understanding of the instructions. ?  ?The patient was advised to call back or seek an in-person evaluation if the symptoms worsen or if the condition fails to improve as anticipated. ? ?I provided 22 minutes of non-face-to-face time during this encounter. ? ? ?Doreatha Massed, MD ? ? ?

## 2022-04-30 ENCOUNTER — Ambulatory Visit (INDEPENDENT_AMBULATORY_CARE_PROVIDER_SITE_OTHER): Payer: BC Managed Care – PPO | Admitting: Internal Medicine

## 2022-04-30 ENCOUNTER — Encounter: Payer: Self-pay | Admitting: Internal Medicine

## 2022-04-30 ENCOUNTER — Encounter: Payer: BC Managed Care – PPO | Admitting: Family Medicine

## 2022-04-30 VITALS — BP 118/82 | HR 86 | Resp 18 | Ht 68.0 in | Wt 177.2 lb

## 2022-04-30 DIAGNOSIS — Z720 Tobacco use: Secondary | ICD-10-CM | POA: Diagnosis not present

## 2022-04-30 DIAGNOSIS — Z0001 Encounter for general adult medical examination with abnormal findings: Secondary | ICD-10-CM | POA: Insufficient documentation

## 2022-04-30 DIAGNOSIS — J3089 Other allergic rhinitis: Secondary | ICD-10-CM

## 2022-04-30 DIAGNOSIS — E559 Vitamin D deficiency, unspecified: Secondary | ICD-10-CM | POA: Diagnosis not present

## 2022-04-30 DIAGNOSIS — J302 Other seasonal allergic rhinitis: Secondary | ICD-10-CM

## 2022-04-30 DIAGNOSIS — D751 Secondary polycythemia: Secondary | ICD-10-CM | POA: Diagnosis not present

## 2022-04-30 NOTE — Progress Notes (Signed)
? ?Established Patient Office Visit ? ?Subjective:  ?Patient ID: Lindsey Jensen, female    DOB: 03-24-1986  Age: 36 y.o. MRN: [id number] ? ?CC:  ?Chief Complaint  ?Patient presents with  ? Annual Exam  ?  Annual exam  ? ? ?HPI ?Lindsey Jensen is a 36 y.o. female with past medical history of allergic rhinitis, asthma, erythrocytosis, GAD and tobacco abuse who presents for annual physical. ? ?She uses azelastine nasal spray and Allegra for allergic rhinitis.  She also uses Xopenex as needed for dyspnea.  She has had allergy and immunology eval and pulmonology eval for these.  She also has some component of GAD, contributing to her dyspnea.  She states that her anxiety symptoms are well controlled currently. ? ?She has history of erythrocytosis, for which she follows up with heme-onc.  She does regular venipuncture.  She does smoke about 0.75 pack/day. ? ? ? ? ?Past Medical History:  ?Diagnosis Date  ? Allergy   ? Phreesia 05/26/2020  ? Anxiety   ? Phreesia 05/26/2020  ? ASCUS of cervix with negative high risk HPV 07/29/2021  ? 07/28/21 repeat in 3 years per [facility], 5 year risk for CIN3+ is 0.40%  ? Asthma   ? Phreesia 05/26/2020  ? Contraceptive education 01/26/2014  ? Erythrocytosis   ? Medical history non-contributory   ? Moderate persistent asthma, uncomplicated 05/19/2019  ? Multiple allergies 06/11/2020  ? ? ?Past Surgical History:  ?Procedure Laterality Date  ? NO PAST SURGERIES    ? ? ?Family History  ?Problem Relation Age of Onset  ? Heart disease Father   ?     heart attack  ? Allergic rhinitis Father   ? Diabetes Mother   ? Depression Mother   ? Anxiety disorder Mother   ? Heart disease Mother   ? Heart disease Maternal Grandmother   ?     CHF  ? Asthma Maternal Grandmother   ? Diabetes Maternal Grandmother   ? High Cholesterol Sister   ? Kidney Stones Brother   ? Alzheimer's disease Maternal Grandfather   ? Asthma Maternal Grandfather   ? Diabetes Maternal Uncle   ? Angioedema Neg Hx   ? Immunodeficiency Neg Hx    ? ? ?Social History  ? ?Socioeconomic History  ? Marital status: Married  ?  Spouse name: Not on file  ? Number of children: 1  ? Years of education: Not on file  ? Highest education level: Bachelor's degree (e.g., BA, AB, BS)  ?Occupational History  ?  Employer: [employer]  ?Tobacco Use  ? Smoking status: Every Day  ?  Packs/day: 0.50  ?  Years: 14.00  ?  Pack years: 7.00  ?  Types: Cigarettes  ? Smokeless tobacco: Never  ?Vaping Use  ? Vaping Use: Never used  ?Substance and Sexual Activity  ? Alcohol use: Yes  ?  Alcohol/week: 14.0 standard drinks  ?  Types: 14 Glasses of wine per week  ?  Comment: glass of wine every night    ? Drug use: No  ? Sexual activity: Yes  ?  Birth control/protection: Other-see comments  ?  Comment: partner has vasectomy  ?Other Topics Concern  ? Not on file  ?Social History Narrative  ? Lives with husband and son  ?   ? Dog: Pitbull: Lana   ?   ? Enjoys: reading-reformed theology; fiction; drink wine, buy shoes, playing cards  ?   ? Diet: eats all food groups  outside of meat-some chicken at times  ? Caffeine: 3 cups of expresso daily   ? Water: 24 oz x 3 bottles daily   ?   ? Wears seat belt  ? Does not use phone while driving  ? Smoke detectors   ? Weapons in a lock box  ? ?Social Determinants of Health  ? ?Financial Resource Strain: Low Risk   ? Difficulty of Paying Living Expenses: Not very hard  ?Food Insecurity: No Food Insecurity  ? Worried About Programme researcher, broadcasting/film/videounning Out of Food in the Last Year: Never true  ? Ran Out of Food in the Last Year: Never true  ?Transportation Needs: No Transportation Needs  ? Lack of Transportation (Medical): No  ? Lack of Transportation (Non-Medical): No  ?Physical Activity: Insufficiently Active  ? Days of Exercise per Week: 3 days  ? Minutes of Exercise per Session: 30 min  ?Stress: Stress Concern Present  ? Feeling of Stress : Very much  ?Social Connections: Moderately Isolated  ? Frequency of Communication with Friends and Family:  More than three times a week  ? Frequency of Social Gatherings with Friends and Family: Once a week  ? Attends Religious Services: Never  ? Active Member of Clubs or Organizations: No  ? Attends BankerClub or Organization Meetings: Never  ? Marital Status: Married  ?Intimate Partner Violence: Not At Risk  ? Fear of Current or Ex-Partner: No  ? Emotionally Abused: No  ? Physically Abused: No  ? Sexually Abused: No  ? ? ?Outpatient Medications Prior to Visit  ?Medication Sig Dispense Refill  ? azelastine (ASTELIN) 0.1 % nasal spray PLACE 2 SPRAYS INTO BOTH NOSTRILS 2 (TWO) TIMES DAILY. (Patient taking differently: Place 2 sprays into both nostrils as needed.) 90 mL 1  ? clindamycin (CLINDAGEL) 1 % gel APPLY TOPICALLY TWICE A DAY 30 g 1  ? EPINEPHrine (AUVI-Q) 0.3 mg/0.3 mL IJ SOAJ injection Inject 0.3 mg into the muscle as needed for anaphylaxis. 1 each 1  ? fexofenadine (ALLEGRA) 180 MG tablet Take 180 mg by mouth daily.    ? fluticasone (FLONASE) 50 MCG/ACT nasal spray SPRAY 2 SPRAYS INTO EACH NOSTRIL EVERY DAY 48 mL 0  ? levalbuterol (XOPENEX HFA) 45 MCG/ACT inhaler TAKE 2 PUFFS BY MOUTH EVERY 6 HOURS AS NEEDED FOR WHEEZE OR SHORTNESS OF BREATH 15 g 1  ? silver sulfADIAZINE (SILVADENE) 1 % cream Use to are 2-3 times daily (Patient not taking: Reported on 04/30/2022) 50 g 11  ? doxycycline (VIBRA-TABS) 100 MG tablet Take 1 tablet (100 mg total) by mouth 2 (two) times daily. (Patient not taking: Reported on 04/30/2022) 20 tablet 0  ? ?No facility-administered medications prior to visit.  ? ? ?Allergies  ?Allergen Reactions  ? Beef-Derived Products Anaphylaxis  ?  Alpha-Gal Allergy   ? Dairy Aid [Tilactase] Other (See Comments)  ?  SOB  ? Meat [Alpha-Gal] Palpitations  ?  All mammal. Symptom asthma .  ? Pork-Derived Products Anaphylaxis  ?  Alpha-Gal Allergy  ? Shellfish Allergy Other (See Comments)  ?  SOB  ? Other Rash and Itching  ? ? ?ROS ?Review of Systems  ?Constitutional:  Negative for chills and fever.  ?HENT:   Negative for congestion, sinus pressure, sinus pain and sore throat.   ?Eyes:  Negative for pain and discharge.  ?Respiratory:  Negative for cough and shortness of breath.   ?Cardiovascular:  Negative for chest pain and palpitations.  ?Gastrointestinal:  Negative for abdominal pain, diarrhea, nausea and vomiting.  ?  Endocrine: Negative for polydipsia and polyuria.  ?Genitourinary:  Negative for dysuria and hematuria.  ?Musculoskeletal:  Negative for neck pain and neck stiffness.  ?Skin:  Negative for rash.  ?Neurological:  Negative for dizziness and weakness.  ?Psychiatric/Behavioral:  Negative for agitation and behavioral problems. The patient is nervous/anxious.   ? ?  ?Objective:  ?  ?Physical Exam ?Vitals reviewed.  ?Constitutional:   ?   General: She is not in acute distress. ?   Appearance: She is not diaphoretic.  ?HENT:  ?   Head: Normocephalic and atraumatic.  ?   Nose: Nose normal. No congestion.  ?   Mouth/Throat:  ?   Mouth: Mucous membranes are moist.  ?   Pharynx: No posterior oropharyngeal erythema.  ?Eyes:  ?   General: No scleral icterus. ?   Extraocular Movements: Extraocular movements intact.  ?Cardiovascular:  ?   Rate and Rhythm: Normal rate and regular rhythm.  ?   Pulses: Normal pulses.  ?   Heart sounds: Normal heart sounds. No murmur heard. ?Pulmonary:  ?   Breath sounds: Normal breath sounds. No wheezing or rales.  ?Abdominal:  ?   Palpations: Abdomen is soft.  ?   Tenderness: There is no abdominal tenderness.  ?Musculoskeletal:  ?   Cervical back: Neck supple. No tenderness.  ?   Right lower leg: No edema.  ?   Left lower leg: No edema.  ?Skin: ?   General: Skin is warm.  ?   Findings: Erythema (Over b/l hands around knuckle area) present. No rash.  ?Neurological:  ?   General: No focal deficit present.  ?   Mental Status: She is alert and oriented to person, place, and time.  ?   Cranial Nerves: No cranial nerve deficit.  ?   Sensory: No sensory deficit.  ?   Motor: No weakness.   ?Psychiatric:     ?   Mood and Affect: Mood normal.     ?   Behavior: Behavior normal.  ? ? ?BP 118/82 (BP Location: Left Arm, Patient Position: Sitting, Cuff Size: Normal)   Pulse 86   Resp 18   Ht 5\' 8"  (1.727 m)   Wt 177 lb 3

## 2022-04-30 NOTE — Patient Instructions (Signed)
Please continue taking medications as prescribed.  Please continue to follow heart healthy diet and perform moderate exercise/walking at least 150 mins/week. 

## 2022-04-30 NOTE — Assessment & Plan Note (Addendum)
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

## 2022-04-30 NOTE — Assessment & Plan Note (Signed)
Physical exam as documented. ?Fasting blood tests today - recent CBC and CMP reviewed. ?PAP smear with Ob/Gyn. ?

## 2022-04-30 NOTE — Assessment & Plan Note (Signed)
Well controlled with azelastine nasal spray and Allegra ?Uses Xopenex as needed for dyspnea ?

## 2022-04-30 NOTE — Assessment & Plan Note (Signed)
Smokes about 0.75 pack/day  Asked about quitting: confirms that he/she currently smokes cigarettes Advise to quit smoking: Educated about QUITTING to reduce the risk of cancer, cardio and cerebrovascular disease. Assess willingness: Unwilling to quit at this time, but is working on cutting back. Assist with counseling and pharmacotherapy: Counseled for 5 minutes and literature provided. Arrange for follow up: follow up in 3 months and continue to offer help. 

## 2022-05-01 LAB — LIPID PANEL
Chol/HDL Ratio: 3.3 ratio (ref 0.0–4.4)
Cholesterol, Total: 138 mg/dL (ref 100–199)
HDL: 42 mg/dL (ref >39)
LDL Chol Calc (NIH): 75 mg/dL (ref 0–99)
Triglycerides: 113 mg/dL (ref 0–149)
VLDL Cholesterol Cal: 21 mg/dL (ref 5–40)

## 2022-05-01 LAB — VITAMIN D 25 HYDROXY (VIT D DEFICIENCY, FRACTURES): Vit D, 25-Hydroxy: 29.8 ng/mL — ABNORMAL LOW (ref 30.0–100.0)

## 2022-05-01 LAB — HEMOGLOBIN A1C
Est. average glucose Bld gHb Est-mCnc: 97 mg/dL
Hgb A1c MFr Bld: 5 % (ref 4.8–5.6)

## 2022-05-01 LAB — TSH: TSH: 2.57 u[IU]/mL (ref 0.450–4.500)

## 2022-05-04 ENCOUNTER — Telehealth: Payer: Self-pay

## 2022-05-04 NOTE — Telephone Encounter (Signed)
Patient returning lab results call 

## 2022-05-04 NOTE — Telephone Encounter (Signed)
LVM for pt to call the office regarding lab results  °

## 2022-05-12 ENCOUNTER — Ambulatory Visit (INDEPENDENT_AMBULATORY_CARE_PROVIDER_SITE_OTHER): Payer: BC Managed Care – PPO | Admitting: Professional

## 2022-05-12 ENCOUNTER — Encounter: Payer: Self-pay | Admitting: Professional

## 2022-05-12 DIAGNOSIS — F4001 Agoraphobia with panic disorder: Secondary | ICD-10-CM | POA: Diagnosis not present

## 2022-05-12 DIAGNOSIS — F411 Generalized anxiety disorder: Secondary | ICD-10-CM | POA: Diagnosis not present

## 2022-05-12 NOTE — Progress Notes (Signed)
   301-358pm  Patient's anxiety had improved and then turned in to all out panic. She had a panic attack about two months ago when she was driving down the road, it was a regular morning and got five minutes away from the house. Patient reports she felt disoriented, everything looked unfamiliar and in an instant she could not stop shaking and felt petrified. She has not driven in the past two months. She has not gotten on an on ramp to a freeway in a year since she had a panic attack and she has avoided.  Pulled son from day care about two months ago and she is providing FT care since he will begin school in August.   Pr reports driving through or to a location where she once had a panic attack. She denies feeling afraid of driving   Pt is struggling to parent her son. He does fairly well when pt is paying full attention to him. He demands her attention by screaming at her, slamming the door, and trying to push her off the chair. Pt admits that she feels triggered when she sees his behavior because she would throw fits because it would get under her skin but it   Friday, May 26   Francie Massing, [facility]

## 2022-05-12 NOTE — Progress Notes (Signed)
[facility] Counselor Initial Adult Exam  Name: Lindsey FlatteryGina M Hirata Date: 05/12/2022 MRN: [id number] DOB: 1986-06-11 PCP: Anabel HalonPatel, Rutwik K, MD  Time spent: 57 minutes 301-358pm  Guardian/Payee:  self    Paperwork requested: No   Reason for Visit Loman Chroman/Presenting Problem: This session was held via video teletherapy. The patient consented to video teletherapy and was located in her vehicle during this session. She is aware it is the responsibility of the patient to secure confidentiality on her end of the session. The provider was in a private home office for the duration of this session.  The patient has experienced increasing difficulty with her anxiety and has been having panic attacks for the past year with avoidance of driving on the highway. Over the past two months, she has not driven at all after experiencing a panic attack while driving with reported dizziness, poor concentration, derealization, hear palpitations that came out of nowhere. Patient reports she is now working at home since pulling her son out of preschool two months ago due to poor supervision and after the school complained about his behavior.  Patient struggled throughout session to stay focused as her nearly four year old son who disruptive, slamming door, screaming at her, striking her, and banging on the doors. Patient denies that he has a behavior issue when Clinician suggested that the child and the patient could benefit from pursuing therapy services for her son. Patient reports that there have been improvements since she first discontinued day care.  Mental Status Exam: Appearance:   Neat     Behavior:  Sharing, Rationalizing, Resistant, Blaming, Care-Taking, and Minimizing  Motor:  Restlestness  Speech/Language:   Clear and Coherent and Pressured  Affect:  Full Range  Mood:  labile  Thought process:  goal directed  Thought content:    WNL  Sensory/Perceptual disturbances:    WNL  Orientation:  oriented to  person, place, time/date, and situation  Attention:  Fair to good  Concentration:  Good  Memory:  WNL  Fund of knowledge:   Good  Insight:    Fair to good  Judgment:   Good  Impulse Control:  Good   Risk Assessment: Danger to Self:  No Self-injurious Behavior: No Danger to Others: No Duty to Warn:no Physical Aggression / Violence:No  Access to Firearms a concern: No  Gang Involvement:No  Patient / guardian was educated about steps to take if suicide or homicide risk level increases between visits: n/a While future psychiatric events cannot be accurately predicted, the patient does not currently require acute inpatient psychiatric care and does not currently meet [facility] involuntary commitment criteria.  Substance Abuse History: Current substance abuse: No     Past Psychiatric History:   Previous psychological history is significant for anxiety Outpatient Providers:Kathleen Iveliz Garay in 2020 History of Psych Hospitalization: No  Psychological Testing: n/a   Abuse History:  Victim of: No., n/a   Report needed: No. Victim of Neglect:No. Perpetrator of n/a  Witness / Exposure to Domestic Violence: No   Protective Services Involvement: No  Witness to [employer] Violence:  No   Family History:  Family History  Problem Relation Age of Onset   Heart disease Father        heart attack   Allergic rhinitis Father    Diabetes Mother    Depression Mother    Anxiety disorder Mother    Heart disease Mother    Heart disease Maternal Grandmother        CHF  Asthma Maternal Grandmother    Diabetes Maternal Grandmother    High Cholesterol Sister    Kidney Stones Brother    Alzheimer's disease Maternal Grandfather    Asthma Maternal Grandfather    Diabetes Maternal Uncle    Angioedema Neg Hx    Immunodeficiency Neg Hx     Living situation: the patient lives with her husband and son  Sexual Orientation: Straight  Relationship Status: married  Name of spouse /  other:Chris, 14, since 2019 If a parent, number of children / ages:Enzo soon to be 4  Support Systems: spouse  Surveyor, quantity Stress:  No   Income/Employment/Disability: Employment  Financial planner: No   Educational History: Education: college graduate  Religion/Sprituality/World View: 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   Legal History: Pending legal issue / charges: The patient has no significant history of legal issues. History of legal issue / charges: none  Medical History/Surgical History: reviewed Past Medical History:  Diagnosis Date   Allergy    Phreesia 05/26/2020   Anxiety    Phreesia 05/26/2020   ASCUS of cervix with negative high risk HPV 07/29/2021   07/28/21 repeat in 3 years per [facility], 5 year risk for CIN3+ is 0.40%   Asthma    Phreesia 05/26/2020   Contraceptive education 01/26/2014   Erythrocytosis    Medical history non-contributory    Moderate persistent asthma, uncomplicated 05/19/2019   Multiple allergies 06/11/2020    Past Surgical History:  Procedure Laterality Date   NO PAST SURGERIES      Medications: Current Outpatient Medications  Medication Sig Dispense Refill   azelastine (ASTELIN) 0.1 % nasal spray PLACE 2 SPRAYS INTO BOTH NOSTRILS 2 (TWO) TIMES DAILY. (Patient taking differently: Place 2 sprays into both nostrils as needed.) 90 mL 1   clindamycin (CLINDAGEL) 1 % gel APPLY TOPICALLY TWICE A DAY 30 g 1   EPINEPHrine (AUVI-Q) 0.3 mg/0.3 mL  IJ SOAJ injection Inject 0.3 mg into the muscle as needed for anaphylaxis. 1 each 1   fexofenadine (ALLEGRA) 180 MG tablet Take 180 mg by mouth daily.     fluticasone (FLONASE) 50 MCG/ACT nasal spray SPRAY 2 SPRAYS INTO EACH NOSTRIL EVERY DAY 48 mL 0   levalbuterol (XOPENEX HFA) 45 MCG/ACT inhaler TAKE 2 PUFFS BY MOUTH EVERY 6 HOURS AS NEEDED FOR WHEEZE OR SHORTNESS OF BREATH 15 g 1   silver sulfADIAZINE (SILVADENE) 1 % cream Use to are 2-3 times daily (Patient not taking: Reported on 04/30/2022) 50 g 11   No current facility-administered medications for this visit.    Allergies  Allergen Reactions   Beef-Derived Products Anaphylaxis    Alpha-Gal Allergy    Dairy Aid [Tilactase] Other (See Comments)    SOB   Meat [Alpha-Gal] Palpitations    All mammal. Symptom asthma .   Pork-Derived Products Anaphylaxis    Alpha-Gal Allergy   Shellfish Allergy Other (See Comments)    SOB   Other Rash and Itching    Diagnoses:  Generalized  anxiety disorder  Panic disorder with agoraphobia, moderate agoraphobic avoidance and severe panic attacks  Plan of Care:  -meet next week to develop treatment plan. -next appointment is Friday, May 22, 2022 at 8am

## 2022-05-22 ENCOUNTER — Ambulatory Visit: Payer: BC Managed Care – PPO | Admitting: Professional

## 2022-08-26 ENCOUNTER — Encounter: Payer: Self-pay | Admitting: Internal Medicine

## 2022-08-28 ENCOUNTER — Ambulatory Visit: Payer: BC Managed Care – PPO | Admitting: Internal Medicine

## 2022-09-14 DIAGNOSIS — J069 Acute upper respiratory infection, unspecified: Secondary | ICD-10-CM | POA: Diagnosis not present

## 2022-09-14 DIAGNOSIS — J019 Acute sinusitis, unspecified: Secondary | ICD-10-CM | POA: Diagnosis not present

## 2022-10-05 ENCOUNTER — Inpatient Hospital Stay: Payer: BC Managed Care – PPO | Attending: Hematology

## 2022-10-05 DIAGNOSIS — F17210 Nicotine dependence, cigarettes, uncomplicated: Secondary | ICD-10-CM | POA: Insufficient documentation

## 2022-10-05 DIAGNOSIS — D751 Secondary polycythemia: Secondary | ICD-10-CM | POA: Insufficient documentation

## 2022-10-05 LAB — CBC WITH DIFFERENTIAL/PLATELET
Abs Immature Granulocytes: 0.02 10*3/uL (ref 0.00–0.07)
Basophils Absolute: 0.1 10*3/uL (ref 0.0–0.1)
Basophils Relative: 1 %
Eosinophils Absolute: 0.1 10*3/uL (ref 0.0–0.5)
Eosinophils Relative: 1 %
HCT: 53.9 % — ABNORMAL HIGH (ref 36.0–46.0)
Hemoglobin: 17.8 g/dL — ABNORMAL HIGH (ref 12.0–15.0)
Immature Granulocytes: 0 %
Lymphocytes Relative: 20 %
Lymphs Abs: 1.7 10*3/uL (ref 0.7–4.0)
MCH: 31.9 pg (ref 26.0–34.0)
MCHC: 33 g/dL (ref 30.0–36.0)
MCV: 96.6 fL (ref 80.0–100.0)
Monocytes Absolute: 0.6 10*3/uL (ref 0.1–1.0)
Monocytes Relative: 7 %
Neutro Abs: 6.1 10*3/uL (ref 1.7–7.7)
Neutrophils Relative %: 71 %
Platelets: 181 10*3/uL (ref 150–400)
RBC: 5.58 MIL/uL — ABNORMAL HIGH (ref 3.87–5.11)
RDW: 15.1 % (ref 11.5–15.5)
WBC: 8.5 10*3/uL (ref 4.0–10.5)
nRBC: 0 % (ref 0.0–0.2)

## 2022-10-05 LAB — FERRITIN: Ferritin: 10 ng/mL — ABNORMAL LOW (ref 11–307)

## 2022-10-05 LAB — FOLATE: Folate: 15.5 ng/mL (ref >5.9)

## 2022-10-05 LAB — VITAMIN B12: Vitamin B-12: 364 pg/mL (ref 180–914)

## 2022-10-12 ENCOUNTER — Ambulatory Visit (HOSPITAL_COMMUNITY): Payer: BC Managed Care – PPO | Admitting: Hematology

## 2022-10-19 ENCOUNTER — Inpatient Hospital Stay (HOSPITAL_BASED_OUTPATIENT_CLINIC_OR_DEPARTMENT_OTHER): Payer: BC Managed Care – PPO | Admitting: Hematology

## 2022-10-19 VITALS — BP 139/81 | HR 98 | Temp 98.6°F | Resp 17 | Ht 68.0 in | Wt 189.5 lb

## 2022-10-19 DIAGNOSIS — F17210 Nicotine dependence, cigarettes, uncomplicated: Secondary | ICD-10-CM | POA: Diagnosis not present

## 2022-10-19 DIAGNOSIS — D582 Other hemoglobinopathies: Secondary | ICD-10-CM

## 2022-10-19 DIAGNOSIS — D751 Secondary polycythemia: Secondary | ICD-10-CM

## 2022-10-19 NOTE — Progress Notes (Signed)
[place], [place] [address]   CLINIC:  Medical Oncology/Hematology  PCP:  Lindell Spar, MD [address] / [place] [address]  [phone]  REASON FOR VISIT:  Follow-up for erythrocytosis  PRIOR THERAPY: none  CURRENT THERAPY: phlebotomy every 6-8 weeks  INTERVAL HISTORY:  [name], a 36 y.o. female, seen for follow-up of erythrocytosis.  She continues to have panic episodes.  Energy levels are 70%.  Last phlebotomy was in June 2023 at 1 blood.  She is continuing to smoke  REVIEW OF SYSTEMS:  Review of Systems  Constitutional:  Negative for appetite change and fatigue (75%).  Skin:  Negative for itching.  Neurological:  Positive for dizziness and headaches. Negative for light-headedness.  Psychiatric/Behavioral:  The patient is nervous/anxious.   All other systems reviewed and are negative.   PAST MEDICAL/SURGICAL HISTORY:  Past Medical History:  Diagnosis Date   Allergy    Phreesia 05/26/2020   Anxiety    Phreesia 05/26/2020   ASCUS of cervix with negative high risk HPV 07/29/2021   07/28/21 repeat in 3 years per [facility], 5 year risk for CIN3+ is 0.40%   Asthma    Phreesia 05/26/2020   Contraceptive education 01/26/2014   Erythrocytosis    Medical history non-contributory    Moderate persistent asthma, uncomplicated 0/30/0923   Multiple allergies 06/11/2020   Past Surgical History:  Procedure Laterality Date   NO PAST SURGERIES      SOCIAL HISTORY:  Social History   Socioeconomic History   Marital status: Married    Spouse name: Not on file   Number of children: 1   Years of education: Not on file   Highest education level: Bachelor's degree (e.g., BA, AB, BS)  Occupational History    Employer: Building surveyor  Tobacco Use   Smoking status: Every Day    Packs/day: 0.50    Years: 14.00    Total pack years: 7.00    Types: Cigarettes   Smokeless tobacco: Never  Vaping Use   Vaping  Use: Never used  Substance and Sexual Activity   Alcohol use: Yes    Alcohol/week: 14.0 standard drinks of alcohol    Types: 14 Glasses of wine per week    Comment: glass of wine every night     Drug use: No   Sexual activity: Yes    Birth control/protection: Other-see comments    Comment: partner has vasectomy  Other Topics Concern   Not on file  Social History Narrative   Lives with husband and son      Dog: Pitbull: Lana       Enjoys: reading-reformed theology; fiction; drink wine, buy shoes, playing cards      Diet: eats all food groups outside of meat-some chicken at times   Caffeine: 3 cups of expresso daily    Water: 24 oz x 3 bottles daily       Wears seat belt   Does not use phone while driving   Art therapist in a lock box   Social Determinants of Health   Financial Resource Strain: Low Risk  (07/21/2021)   Overall Financial Resource Strain (CARDIA)    Difficulty of Paying Living Expenses: Not very hard  Food Insecurity: No Food Insecurity (07/21/2021)   Hunger Vital Sign    Worried About Running Out of Food in the Last Year: Never true    Ran Out of  Food in the Last Year: Never true  Transportation Needs: No Transportation Needs (07/21/2021)   PRAPARE - Hydrologist (Medical): No    Lack of Transportation (Non-Medical): No  Physical Activity: Insufficiently Active (07/21/2021)   Exercise Vital Sign    Days of Exercise per Week: 3 days    Minutes of Exercise per Session: 30 min  Stress: Stress Concern Present (07/21/2021)   [place]    Feeling of Stress : Very much  Social Connections: Moderately Isolated (07/21/2021)   Social Connection and Isolation Panel [NHANES]    Frequency of Communication with Friends and Family: More than three times a week    Frequency of Social Gatherings with Friends and Family: Once a week    Attends Religious Services: Never     Marine scientist or Organizations: No    Attends Archivist Meetings: Never    Marital Status: Married  Human resources officer Violence: Not At Risk (07/21/2021)   Humiliation, Afraid, Rape, and Kick questionnaire    Fear of Current or Ex-Partner: No    Emotionally Abused: No    Physically Abused: No    Sexually Abused: No    FAMILY HISTORY:  Family History  Problem Relation Age of Onset   Heart disease Father        heart attack   Allergic rhinitis Father    Diabetes Mother    Depression Mother    Anxiety disorder Mother    Heart disease Mother    Heart disease Maternal Grandmother        CHF   Asthma Maternal Grandmother    Diabetes Maternal Grandmother    High Cholesterol Sister    Kidney Stones Brother    Alzheimer's disease Maternal Grandfather    Asthma Maternal Grandfather    Diabetes Maternal Uncle    Angioedema Neg Hx    Immunodeficiency Neg Hx     CURRENT MEDICATIONS:  Current Outpatient Medications  Medication Sig Dispense Refill   azelastine (ASTELIN) 0.1 % nasal spray PLACE 2 SPRAYS INTO BOTH NOSTRILS 2 (TWO) TIMES DAILY. (Patient taking differently: Place 2 sprays into both nostrils as needed.) 90 mL 1   clindamycin (CLINDAGEL) 1 % gel APPLY TOPICALLY TWICE A DAY 30 g 1   EPINEPHrine (AUVI-Q) 0.3 mg/0.3 mL IJ SOAJ injection Inject 0.3 mg into the muscle as needed for anaphylaxis. 1 each 1   fexofenadine (ALLEGRA) 180 MG tablet Take 180 mg by mouth daily.     fluticasone (FLONASE) 50 MCG/ACT nasal spray SPRAY 2 SPRAYS INTO EACH NOSTRIL EVERY DAY 48 mL 0   levalbuterol (XOPENEX HFA) 45 MCG/ACT inhaler TAKE 2 PUFFS BY MOUTH EVERY 6 HOURS AS NEEDED FOR WHEEZE OR SHORTNESS OF BREATH 15 g 1   silver sulfADIAZINE (SILVADENE) 1 % cream Use to are 2-3 times daily 50 g 11   No current facility-administered medications for this visit.    ALLERGIES:  Allergies  Allergen Reactions   Beef-Derived Products Anaphylaxis    Alpha-Gal Allergy    Dairy Aid  [Tilactase] Other (See Comments)    SOB   Meat [Alpha-Gal] Palpitations    All mammal. Symptom asthma .   Pork-Derived Products Anaphylaxis    Alpha-Gal Allergy   Shellfish Allergy Other (See Comments)    SOB   Other Rash and Itching    PHYSICAL EXAM:  Performance status (ECOG): 0 - Asymptomatic  Vitals:   10/19/22 1549  BP: 139/81  Pulse: 98  Resp: 17  Temp: 98.6 F (37 C)  SpO2: 100%   Wt Readings from Last 3 Encounters:  10/19/22 189 lb 8 oz (86 kg)  04/30/22 177 lb 3.2 oz (80.4 kg)  02/26/22 178 lb 8 oz (81 kg)   Physical Exam Vitals reviewed.  Constitutional:      Appearance: Normal appearance.  Cardiovascular:     Rate and Rhythm: Normal rate and regular rhythm.     Pulses: Normal pulses.     Heart sounds: Normal heart sounds.  Pulmonary:     Effort: Pulmonary effort is normal.     Breath sounds: Normal breath sounds.  Abdominal:     Palpations: Abdomen is soft. There is no hepatomegaly, splenomegaly or mass.     Tenderness: There is no abdominal tenderness.  Neurological:     General: No focal deficit present.     Mental Status: She is alert and oriented to person, place, and time.  Psychiatric:        Mood and Affect: Mood normal.        Behavior: Behavior normal.     LABORATORY DATA:  I have reviewed the labs as listed.     Latest Ref Rng & Units 10/05/2022    2:36 PM 04/02/2022    3:28 PM 01/15/2022    3:17 PM  CBC  WBC 4.0 - 10.5 K/uL 8.5  6.4  6.9   Hemoglobin 12.0 - 15.0 g/dL 17.8  16.1  16.9   Hematocrit 36.0 - 46.0 % 53.9  49.7  51.7   Platelets 150 - 400 K/uL 181  174  182       Latest Ref Rng & Units 04/02/2022    3:28 PM 10/02/2021    3:29 PM 06/02/2021    4:13 PM  CMP  Glucose 70 - 99 mg/dL 98  94  93   BUN 6 - 20 mg/dL _0 Creatinine 0.44 - 1.00 mg/dL 0.67  0.61  0.65   Sodium 135 - 145 mmol/L 136  134  133   Potassium 3.5 - 5.1 mmol/L 4.2  4.3  4.2   Chloride 98 - 111 mmol/L 104  101  101   CO2 22 - 32 mmol/L _1 Calcium 8.9 - 10.3 mg/dL 8.9  8.8  8.6   Total Protein 6.5 - 8.1 g/dL 7.3  7.5  6.7   Total Bilirubin 0.3 - 1.2 mg/dL 0.7  1.0  0.9   Alkaline Phos 38 - 126 U/L 66  76  60   AST 15 - 41 U/L _2 ALT 0 - 44 U/L _3 Component Value Date/Time   RBC 5.58 (H) 10/05/2022 1436   MCV 96.6 10/05/2022 1436   MCV 95 04/29/2021 0850   MCH 31.9 10/05/2022 1436   MCHC 33.0 10/05/2022 1436   RDW 15.1 10/05/2022 1436   RDW 14.2 04/29/2021 0850   LYMPHSABS 1.7 10/05/2022 1436   LYMPHSABS 1.6 04/29/2021 0850   MONOABS 0.6 10/05/2022 1436   EOSABS 0.1 10/05/2022 1436   EOSABS 0.2 04/29/2021 0850   BASOSABS 0.1 10/05/2022 1436   BASOSABS 0.1 04/29/2021 0850    DIAGNOSTIC IMAGING:  I have independently reviewed the scans and discussed with the patient. No results found.   ASSESSMENT:  1.  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  Carboxyhemoglobin 4.2 (0.5-1.5) -Positive erythromelalgia. -Erythropoietin 12.7. -Phlebotomy on 02/19/2020.  She felt better after that with improvement in erythromelalgia.  However she started to have the symptoms back in the past few days. -We reviewed results from 03/05/2020, hemoglobin was 18.9, hematocrit was 59.4. -CALR and MPL mutation were negative. -Echo on 03/05/2020 shows EF 60 to 65% with normal function.  Atria were normal. -Ultrasound of the abdomen on 04/05/2020 did not show any cystic lesions in the liver or kidneys.  Spleen size was normal. - She has declined bone marrow biopsy in the past.   PLAN:  1.  JAK2 negative erythrocytosis: - She has symptoms including conjunctival erythema and redness in the fingers when her hemoglobin is high.  She does not have aquagenic pruritus.  No prior thrombosis. - Overall intermittent phlebotomies are helping control her symptoms. - Last phlebotomy was end of June at 1 blood. - Her symptoms also get worse when she gets panic attacks. - Reviewed  labs from 10/05/2022 which showed hemoglobin 17.8 and hematocrit 54.  White count and platelet count was normal.  D47, folic acid were normal. - Recommend continue phlebotomy every 8 weeks. - RTC 6 months for follow-up with repeat CBC.     2.  Tobacco use: - She is continuing to smoke half pack of cigarettes per day.  She is thinking about quitting in the new year.  Orders placed this encounter:  Orders Placed This Encounter  Procedures   CBC with Differential/Platelet   Ferritin      Derek Jack, MD [facility] [phone]

## 2022-10-19 NOTE — Patient Instructions (Addendum)
[facility]  Discharge Instructions  You were seen and examined today by Dr. Delton Coombes.  Dr. Delton Coombes discussed your most recent lab work which revealed that everything looks good except your ferritin is slightly low and your hemoglobin and hematocrit is high.   Schedule for your phlebotomy.   Follow-up as scheduled in 6 months.    Thank you for choosing [facility] to provide your oncology and hematology care.   To afford each patient quality time with our provider, please arrive at least 15 minutes before your scheduled appointment time. You may need to reschedule your appointment if you arrive late (10 or more minutes). Arriving late affects you and other patients whose appointments are after yours.  Also, if you miss three or more appointments without notifying the office, you may be dismissed from the clinic at the provider's discretion.    Again, thank you for choosing [facility].  Our hope is that these requests will decrease the amount of time that you wait before being seen by our physicians.   If you have a lab appointment with the [facility] please come in thru the Main Entrance and check in at the main information desk.           _____________________________________________________________  Should you have questions after your visit to [facility], please contact our office at [phone] and follow the prompts.  Our office hours are 8:00 a.m. to 4:30 p.m. Monday - Thursday and 8:00 a.m. to 2:30 p.m. Friday.  Please note that voicemails left after 4:00 p.m. may not be returned until the following business day.  We are closed weekends and all major holidays.  You do have access to a nurse 24-7, just call the main number to the clinic [phone] and do not press any options, hold on the line and a nurse will answer the phone.    For prescription refill requests, have your pharmacy contact  our office and allow 72 hours.    Masks are optional in the cancer centers. If you would like for your care team to wear a mask while they are taking care of you, please let them know. You may have one support person who is at least 35 years old accompany you for your appointments.

## 2022-10-26 ENCOUNTER — Encounter: Payer: Self-pay | Admitting: Internal Medicine

## 2022-10-26 ENCOUNTER — Ambulatory Visit (INDEPENDENT_AMBULATORY_CARE_PROVIDER_SITE_OTHER): Payer: BC Managed Care – PPO | Admitting: Internal Medicine

## 2022-10-26 VITALS — BP 140/82 | HR 88 | Temp 98.1°F | Resp 18 | Ht 68.0 in | Wt 185.0 lb

## 2022-10-26 DIAGNOSIS — T7800XD Anaphylactic reaction due to unspecified food, subsequent encounter: Secondary | ICD-10-CM | POA: Diagnosis not present

## 2022-10-26 DIAGNOSIS — J3089 Other allergic rhinitis: Secondary | ICD-10-CM

## 2022-10-26 DIAGNOSIS — J4520 Mild intermittent asthma, uncomplicated: Secondary | ICD-10-CM

## 2022-10-26 DIAGNOSIS — T7800XA Anaphylactic reaction due to unspecified food, initial encounter: Secondary | ICD-10-CM

## 2022-10-26 MED ORDER — AZELASTINE HCL 0.1 % NA SOLN: 2.0000 | Freq: Two times a day (BID) | NASAL | 1 refills | Status: DC

## 2022-10-26 MED ORDER — FLUTICASONE PROPIONATE 50 MCG/ACT NA SUSP: NASAL | 1 refills | Status: DC

## 2022-10-26 MED ORDER — EPINEPHRINE 0.3 MG/0.3ML IJ SOAJ: 0.3000 mg | INTRAMUSCULAR | 1 refills | Status: DC | PRN

## 2022-10-26 MED ORDER — LEVALBUTEROL TARTRATE 45 MCG/ACT IN AERO: INHALATION_SPRAY | RESPIRATORY_TRACT | 1 refills | Status: DC

## 2022-10-26 MED ORDER — FEXOFENADINE HCL 180 MG PO TABS: 180.0000 mg | ORAL_TABLET | Freq: Every day | ORAL | 1 refills | Status: DC

## 2022-10-26 NOTE — Patient Instructions (Addendum)
Asthma -Continue albuterol 2 puffs once every 4 hours as needed for cough or wheeze -You may use albuterol 2 puffs 5-15 minutes before activity to decrease cough or wheeze  Allergic rhinitis -Continue allergen avoidance measures directed toward dust mites, cat, dog, cockroach, and tree pollen as listed below -Continue Allegra 180 mg once a day as needed for a runny nose or itch.   -Continue Flonase 2 sprays in each nostril once a day.  Aim upward and outward - Continue azelastine 2 sprays in each nostril twice a day as needed for nasal symptoms Consider saline nasal rinses as needed for nasal symptoms. Use this before any medicated nasal sprays for best result   Food allergy Continue to avoid red meats, shellfish, peanuts, and tree nuts.  In case of an allergic reaction, take Benadryl 50 mg every 4 hours, and if life-threatening symptoms occur, inject with EpiPen 0.3 mg. If you are interested in retesting, please hold all anti histamines prior to next visit for about 5 days.  We can do skin prick and blood testing at the time.   Return in about 6 months (around 04/26/2023).

## 2022-10-26 NOTE — Addendum Note (Signed)
Addended by: Norville Haggard on: 10/26/2022 05:18 PM   Modules accepted: Orders

## 2022-10-26 NOTE — Progress Notes (Signed)
FOLLOW UP Date of Service/Encounter:  10/26/22   Subjective:  Lindsey Jensen (DOB: 1986-10-17) is a 36 y.o. female who returns to the [facility] on 10/26/2022 for follow up for asthma, food allergies, rhinitis.  History obtained from: chart review and patient. Last visit 05/2021 with Webb Silversmith Ambs: Asthma: Albuterol PRN AR: Flonase, Azelastine, Allegra Food Allergy: avoids red meats, shellfish, peanut, treenut  Asthma:  Since last visit, she reports doing well.  She rarely has to use Albuterol, usually <1x/month.  She does require it more during Spring/Fall or with illness.  No nighttime sxs, ER visits, oral prednisone.   Allergies: Reports doing okay.  She does have worse congestion/runny nose in AM but it improves after showering.  She is using Flonase daily.  She has Azelastine but only uses it in Springtime when symptoms are worse.  Also on Allegra 180mg  daily.    Food Allergy: Her prior history of food reactions in unclear.  She still avoids nuts, treenuts, shellfish and red meat.  She eats cheese without issues. She has an Epipen but has not had to use it.   Past Medical History: Past Medical History:  Diagnosis Date   Allergy    Phreesia 05/26/2020   Anxiety    Phreesia 05/26/2020   ASCUS of cervix with negative high risk HPV 07/29/2021   07/28/21 repeat in 3 years per [facility], 5 year risk for CIN3+ is 0.40%   Asthma    Phreesia 05/26/2020   Contraceptive education 01/26/2014   Erythrocytosis    Medical history non-contributory    Moderate persistent asthma, uncomplicated 4/49/6759   Multiple allergies 06/11/2020    Objective:  BP (!) 140/82   Pulse 88   Temp 98.1 F (36.7 C)   Resp 18   Ht 5\' 8"  (1.727 m)   Wt 185 lb (83.9 kg)   SpO2 97%   BMI 28.13 kg/m  Body mass index is 28.13 kg/m. Physical Exam: GEN: alert, well developed HEENT: clear conjunctiva, TM grey and translucent, nose with moderate inferior turbinate hypertrophy, pink nasal mucosa, clear  rhinorrhea, + cobblestoning HEART: regular rate and rhythm, no murmur LUNGS: clear to auscultation bilaterally, no coughing, unlabored respiration SKIN: no rashes or lesions  Data Reviewed:  10/2019:  negative shellfish/fish panel Positive hazelnut, almond Negative coconut, pecan, cashew, pistachio, almond, [place] nut  Spirometry:  Tracings reviewed. Her effort: Good reproducible efforts. FVC: 3.09L FEV1: 2.69L, 96% predicted FEV1/FVC ratio: 87% Interpretation: Spirometry consistent with normal pattern.  Please see scanned spirometry results for details.   Assessment/Plan   Mild Intermittent Asthma -Continue levalbuterol 2 puffs once every 4 hours as needed for cough or wheeze -You may use levalbuterol 2 puffs 5-15 minutes before activity to decrease cough or wheeze  Allergic rhinitis -Continue allergen avoidance measures directed toward dust mites, cat, dog, cockroach, and tree pollen as listed below -Continue Allegra 180 mg once a day as needed for a runny nose or itch.   -Continue Flonase 2 sprays in each nostril once a day.  Aim upward and outward - Continue azelastine 2 sprays in each nostril twice a day as needed for nasal symptoms Consider saline nasal rinses as needed for nasal symptoms. Use this before any medicated nasal sprays for best result   Food allergy - Continue to avoid red meats, shellfish, peanuts, and tree nuts.  In case of an allergic reaction, take Benadryl 50 mg every 4 hours, and if life-threatening symptoms occur, inject with EpiPen 0.3 mg. -  If you are interested in retesting, please hold all anti histamines prior to next visit for about 5 days.  We can do skin prick and blood testing at the time.     Return in about 6 months (around 04/26/2023). Harlon Flor, MD  [facility]

## 2022-11-10 ENCOUNTER — Inpatient Hospital Stay: Payer: BC Managed Care – PPO | Attending: Hematology

## 2022-11-10 DIAGNOSIS — F17210 Nicotine dependence, cigarettes, uncomplicated: Secondary | ICD-10-CM | POA: Diagnosis not present

## 2022-11-10 DIAGNOSIS — D751 Secondary polycythemia: Secondary | ICD-10-CM | POA: Insufficient documentation

## 2022-11-10 NOTE — Progress Notes (Signed)
Lindsey Jensen presents today for phlebotomy per MD orders. Phlebotomy procedure started at 1319 and ended at 1330 for right arm. Left arm phlebotomy started at 1333 and ended 1337.  500 cc removed. Patient tolerated procedure well. IV needle removed intact.  Patient stated her right arm felt "funny".  Right arm phlebotomy stopped and moved to left arm with no complaints voiced. Patient tolerated phlebotomy with no complaints voiced.  Peripheral IV sites intact with no bruising or swelling noted.  Denied SOB, chest pain, or dizziness.  Gauze with coban applied.  Discharged with VSS and no s/s of distress noted.

## 2022-11-10 NOTE — Patient Instructions (Signed)
[facility] [place]  Discharge Instructions: Thank you for choosing  Cancer Center to provide your oncology and hematology care.  If you have a lab appointment with the Cancer Center, please come in thru the Main Entrance and check in at the main information desk.  Wear comfortable clothing and clothing appropriate for easy access to any Portacath or PICC line.   We strive to give you quality time with your provider. You may need to reschedule your appointment if you arrive late (15 or more minutes).  Arriving late affects you and other patients whose appointments are after yours.  Also, if you miss three or more appointments without notifying the office, you may be dismissed from the clinic at the provider's discretion.      For prescription refill requests, have your pharmacy contact our office and allow 72 hours for refills to be completed.    Therapeutic Phlebotomy, Care After The following information offers guidance on how to care for yourself after your procedure. Your health care provider may also give you more specific instructions. If you have problems or questions, contact your health care provider. What can I expect after the procedure? After therapeutic phlebotomy, it is common to have: Light-headedness or dizziness. You may feel faint. Nausea. Tiredness (fatigue). Follow these instructions at home: Eating and drinking Be sure to eat well-balanced meals for the next 24 hours. Drink enough fluid to keep your urine pale yellow. Avoid drinking alcohol on the day that you had the procedure. Activity  Return to your normal activities as told by your health care provider. Most people can go back to their normal activities right away. Avoid activities that take a lot of effort for about 5 hours after the procedure. Athletes should avoid strenuous exercise for at least 12 hours. Avoid heavy lifting or pulling for about 5 hours after the procedure. Do not lift  anything that is heavier than 10 lb (4.5 kg). Change positions slowly for the remainder of the day, like from sitting to standing. This can help prevent light-headedness or fainting. If you feel light-headed, lie down until the feeling goes away. Needle insertion site care  Keep your bandage (dressing) dry. You can remove the bandage after about 5 hours or as told by your health care provider. If you have bleeding from the needle insertion site, raise (elevate) your arm and press firmly on the site until the bleeding stops. If you have bruising at the site, apply ice to the area. To do this: Put ice in a plastic bag. Place a towel between your skin and the bag. Leave the ice on for 20 minutes, 2-3 times a day for the first 24 hours. Remove the ice if your skin turns bright red so you do not damage the area. If the swelling does not go away after 24 hours, apply a warm, moist cloth (warm compress) to the area for 20 minutes, 2-3 times a day. General instructions Do not use any products that contain nicotine or tobacco, like cigarettes, chewing tobacco, and vaping devices, such as e-cigarettes, for at least 30 minutes after the procedure. If you need help quitting, ask your health care provider. Keep all follow-up visits. You may need to continue having regular blood tests and therapeutic phlebotomy treatments as directed. Contact a health care provider if: You have redness, swelling, or pain at the needle insertion site. Fluid or blood is coming from the needle insertion site. Pus or a bad smell is coming from   needle insertion site. The needle insertion site feels warm to the touch. You feel light-headed, dizzy, or nauseous, and the feeling does not go away. You have new bruising at the needle insertion site. You feel weaker than normal. You have a fever or chills. Get help right away if: You have chest pain. You have trouble breathing. You have severe nausea or  vomiting. Summary After the procedure, it is common to have some light-headedness, dizziness, nausea, or tiredness (fatigue). Be sure to eat well-balanced meals for the next 24 hours. Drink enough fluid to keep your urine pale yellow. Return to your normal activities as told by your health care provider. Keep all follow-up visits. You may need to continue having regular blood tests and therapeutic phlebotomy treatments as directed. This information is not intended to replace advice given to you by your health care provider. Make sure you discuss any questions you have with your health care provider. Document Revised: 06/04/2021 Document Reviewed: 06/04/2021 Elsevier Patient Education  2023 [employer] Inc.     To help prevent nausea and vomiting after your treatment, we encourage you to take your nausea medication as directed.  BELOW ARE SYMPTOMS THAT SHOULD BE REPORTED IMMEDIATELY: *FEVER GREATER THAN 100.4 F (38 C) OR HIGHER *CHILLS OR SWEATING *NAUSEA AND VOMITING THAT IS NOT CONTROLLED WITH YOUR NAUSEA MEDICATION *UNUSUAL SHORTNESS OF BREATH *UNUSUAL BRUISING OR BLEEDING *URINARY PROBLEMS (pain or burning when urinating, or frequent urination) *BOWEL PROBLEMS (unusual diarrhea, constipation, pain near the anus) TENDERNESS IN MOUTH AND THROAT WITH OR WITHOUT PRESENCE OF ULCERS (sore throat, sores in mouth, or a toothache) UNUSUAL RASH, SWELLING OR PAIN  UNUSUAL VAGINAL DISCHARGE OR ITCHING   Items with * indicate a potential emergency and should be followed up as soon as possible or go to the Emergency Department if any problems should occur.  Please show the CHEMOTHERAPY ALERT CARD or IMMUNOTHERAPY ALERT CARD at check-in to the Emergency Department and triage nurse.  Should you have questions after your visit or need to cancel or reschedule your appointment, please contact [facility] [phone]  and follow the prompts.  Office hours are 8:00 a.m. to 4:30 p.m.  Monday - Friday. Please note that voicemails left after 4:00 p.m. may not be returned until the following business day.  We are closed weekends and major holidays. You have access to a nurse at all times for urgent questions. Please call the main number to the clinic [phone] and follow the prompts.  For any non-urgent questions, you may also contact your provider using MyChart. We now offer e-Visits for anyone 84 and older to request care online for non-urgent symptoms. For details visit [email or web address].   Also download the MyChart app! Go to the app store, search "MyChart", open the app, select [place], and log in with your MyChart username and password.  Masks are optional in the cancer centers. If you would like for your care team to wear a mask while they are taking care of you, please let them know. You may have one support person who is at least 34 years old accompany you for your appointments.

## 2022-11-28 ENCOUNTER — Other Ambulatory Visit: Payer: Self-pay | Admitting: Adult Health

## 2022-11-30 ENCOUNTER — Encounter: Payer: Self-pay | Admitting: Internal Medicine

## 2022-12-03 ENCOUNTER — Ambulatory Visit (INDEPENDENT_AMBULATORY_CARE_PROVIDER_SITE_OTHER): Payer: BC Managed Care – PPO | Admitting: Professional

## 2022-12-03 ENCOUNTER — Encounter: Payer: Self-pay | Admitting: Professional

## 2022-12-03 ENCOUNTER — Encounter: Payer: Self-pay | Admitting: Family Medicine

## 2022-12-03 ENCOUNTER — Ambulatory Visit (INDEPENDENT_AMBULATORY_CARE_PROVIDER_SITE_OTHER): Payer: BC Managed Care – PPO | Admitting: Family Medicine

## 2022-12-03 VITALS — BP 128/82 | HR 79 | Ht 68.0 in | Wt 185.0 lb

## 2022-12-03 DIAGNOSIS — F410 Panic disorder [episodic paroxysmal anxiety] without agoraphobia: Secondary | ICD-10-CM | POA: Diagnosis not present

## 2022-12-03 DIAGNOSIS — F411 Generalized anxiety disorder: Secondary | ICD-10-CM | POA: Diagnosis not present

## 2022-12-03 MED ORDER — BUSPIRONE HCL 5 MG PO TABS: 5.0000 mg | ORAL_TABLET | Freq: Three times a day (TID) | ORAL | 0 refills | Status: DC

## 2022-12-03 NOTE — Patient Instructions (Addendum)
F/ U  with Dr Allena Katz in 4 to 6 weeks , re evaluate anxiety, call if you need to be seen before  New for anxiety is Buspar lowest dose every 8 hours  Please DO continue with therapy  Thanks for choosing [facility], we consider it a privelige to serve you.

## 2022-12-03 NOTE — Progress Notes (Signed)
[facility] Counselor/Therapist Progress Note  Patient ID: Lindsey Jensen, MRN: [id number],    Date: 12/03/2022  Time Spent: 58 minutes 102-200pm  Treatment Type: Individual Therapy  Risk Assessment: Danger to Self:  No Self-injurious Behavior: No Danger to Others: No  Subjective: This session was held via video teletherapy. The patient consented to video teletherapy and was located in her car during this session. She is aware it is the responsibility of the patient to secure confidentiality on her end of the session. The provider was in a private home office for the duration of this session.    The patient arrived on time for her webex appointment.  Issues: 1-anxiety a-patient reports her anxiety has worsened. -she doesn't acknowledge the anxious thoughts but her "body is always clenched" -discussed body not recognizing the difference between good stress and bad stress b-pt reports she has started taking an ITC amino acid L-Theanie -reports this has been helpful -not interested in prescription medication at this time c-coping skills -reinforced use -discussed helpful strategies at work, in the car, in the grocery store, and in the home d-understanding of anxiety and how it impacts -pt admits she has read anything she can get her hands on to understand -pt admits she is not applying what she has read -pt realizes that she must do her part in order to gain control over the anxiety 2-treatment planning -patient and Clinician completed treatment planning -pt is in agreement with her plan  2023 Treatment Plan Problems Addressed  Anxiety Goals 1. Enhance ability to effectively cope with the full variety of life's worries and anxieties. 2. Learn and implement coping skills that result in a reduction of anxiety and worry, and improved daily functioning. 3. Reduce overall frequency, intensity, and duration of the anxiety so that daily functioning is not  impaired. Objective Describe situations, thoughts, feelings, and actions associated with anxieties and worries, their impact on functioning, and attempts to resolve them. Target Date: 2023-12-03 Frequency: Biweekly  Progress: 0 Modality: individual  Related Interventions Ask the client to describe his/her past experiences of anxiety and their impact on functioning; assess the focus, excessiveness, and uncontrollability of the worry and the type, frequency, intensity, and duration of his/her anxiety symptoms (consider using a structured interview such as The Anxiety Disorders Interview Schedule-Adult Version). Objective Learn and implement calming skills to reduce overall anxiety and manage anxiety symptoms. Target Date: 2023-12-03 Frequency: Biweekly  Progress: 0 Modality: individual  Related Interventions Teach the client calming/relaxation skills (e.g., applied relaxation, progressive muscle relaxation, cue controlled relaxation; mindful breathing; biofeedback) and how to discriminate better between relaxation and tension; teach the client how to apply these skills to his/her daily life (e.g., New Directions in Progressive Muscle Relaxation by Marcelyn Ditty, and Hazlett-Stevens; Treating Generalized Anxiety Disorder by Rygh and Ida Rogue). Assign the client homework each session in which he/she practices relaxation exercises daily, gradually applying them progressively from non-anxiety-provoking to anxiety-provoking situations; review and reinforce success while providing corrective feedback toward improvement. Assign the client to read about progressive muscle relaxation and other calming strategies in relevant books or treatment manuals (e.g., Progressive Relaxation Training by Robb Matar and Alen Blew; Mastery of Your Anxiety and Worry: Workbook by Earlie Counts). Objective Verbalize an understanding of the role that cognitive biases play in excessive irrational worry and persistent  anxiety symptoms. Target Date: 2023-12-03 Frequency: Biweekly  Progress: 0 Modality: individual  Related Interventions Discuss examples demonstrating that unrealistic worry typically overestimates the probability of threats and underestimates or overlooks  the client's ability to manage realistic demands (or assign "Past Successful Anxiety Coping" in the Adult Psychotherapy Homework Planner by Stephannie Li). Assist the client in analyzing his/her worries by examining potential biases such as the probability of the negative expectation occurring, the real consequences of it occurring, his/her ability to control the outcome, the worst possible outcome, and his/her ability to accept it (see "Analyze the Probability of a Feared Event" in the Adult Psychotherapy Homework Planner by Stephannie Li; Cognitive Therapy of Anxiety Disorders by Laurence Slate). Help the client gain insight into the notion that worry may function as a form of avoidance of a feared problem and that it creates acute and chronic tension. Objective Identify, challenge, and replace biased, fearful self-talk with positive, realistic, and empowering self-talk. Target Date: 2023-12-03 Frequency: Biweekly  Progress: 0 Modality: individual  Related Interventions Explore the client's schema and self-talk that mediate his/her fear response; assist him/her in challenging the biases; replace the distorted messages with reality-based alternatives and positive, realistic self-talk that will increase his/her self-confidence in coping with irrational fears (see Cognitive Therapy of Anxiety Disorders by Laurence Slate). Assign the client a homework exercise in which he/she identifies fearful self-talk, identifies biases in the self-talk, generates alternatives, and tests through behavioral experiments (or assign "Negative Thoughts Trigger Negative Feelings" in the Adult Psychotherapy Homework Planner by [facility]); review and reinforce success, providing corrective  feedback toward improvement. Objective Undergo gradual repeated imaginal exposure to the feared negative consequences predicted by worries and develop alternative reality-based predictions. Target Date: 2023-12-03 Frequency: Biweekly  Progress: 0 Modality: individual  Related Interventions Direct and assist the client in constructing a hierarchy of two to three spheres of worry for use in exposure (e.g., worry about harm to others, financial difficulties, relationship problems). Ask the client to vividly imagine worst-case consequences of worries, holding them in mind until anxiety associated with them weakens (up to 30 minutes); generate reality-based alternatives to that worst case and process them (see Mastery of Your Anxiety and Worry: Therapist Guide by Rosiland Oz). Assign the client a homework exercise in which he/she does worry exposures and records responses (see Mastery of Your Anxiety and Worry: Workbook by Elenora Fender and Filbert Schilder or Generalized Anxiety Disorder by Elesa Hacker, and Filbert Schilder); review, reinforce success, and provide corrective feedback toward improvement. Objective Learn and implement relapse prevention strategies for managing possible future anxiety symptoms. Target Date: 2023-12-03 Frequency: Biweekly  Progress: 0 Modality: individual  Related Interventions Discuss with the client the distinction between a lapse and relapse, associating a lapse with an initial and reversible return of worry, anxiety symptoms, or urges to avoid, and relapse with the decision to continue the fearful and avoidant patterns. Identify and rehearse with the client the management of future situations or circumstances in which lapses could occur. Instruct the client to routinely use new therapeutic skills (e.g., relaxation, cognitive restructuring, exposure, and problem-solving) in daily life to address emergent worries, anxiety, and avoidant tendencies. Develop a "coping card" on which  coping strategies and other important information (e.g., "Breathe deeply and relax," "Challenge unrealistic worries," "Use problem-solving") are written for the client's later use. Schedule periodic "maintenance" sessions to help the client maintain therapeutic gains. Objective Learn to accept limitations in life and commit to tolerating, rather than avoiding, unpleasant emotions while accomplishing meaningful goals. Target Date: 2023-12-03 Frequency: Biweekly  Progress: 0 Modality: individual  Related Interventions Use techniques from Acceptance and Commitment Therapy to help client accept uncomfortable realities such as lack of complete control,  imperfections, and uncertainty and tolerate unpleasant emotions and thoughts in order to accomplish value-consistent goals. 4. Stabilize anxiety level while increasing ability to function on a daily basis.  Diagnosis:Generalized anxiety disorder  Plan:  -practice mindfulness and coping skills to manage anxiety -meet again on Monday, January 04, 2023 at 9am.  Teofilo Pod, [facility]

## 2022-12-03 NOTE — Progress Notes (Signed)
   Lindsey Jensen     MRN: [id number]      DOB: Jul 08, 1986   HPI Lindsey Jensen is here with a 4.5 year h/o  uncontrolled anxiety, feels as if since she had her son her anxiety has worsened, now that she has responsibility for a human being Loves being a Mom and has great support from her spouse who accompanies her , however realizes that anxiety is consumming her life and limiting her ability to function in basic areas of her life like driving a short distance and going to the supermarket. She has had episodes when she has had to get out of the supermarket abruptly and also o pull off the road after driving a short distance. Experienced a panic attack which took her to the ED  Has been dx in the past with anxiety and panic, buspar was recommended in the past has only had behavioral therapy for this , but  as symptoms are becoming increasingly debilitating,  she is re examining er approach  Updated thyroid study earlier this year is normal  ROS Denies recent fever or chills. Reports feeling off balance at times when she changes position as though she will fall, denies vertigo also foggy vision when this occurs  PE  BP 128/82   Pulse 79   Ht 5\' 8"  (1.727 m)   Wt 185 lb (83.9 kg)   SpO2 98%   BMI 28.13 kg/m    Patient alert and oriented and in no cardiopulmonary distress.  HEENT: No facial asymmetry, EOMI,     Neck supple .  Chest: Clear to auscultation bilaterally.  CVS: S1, S2 no murmurs, no S3.Regular rate.  Ext: No edema  MS: Adequate ROM spine, shoulders, hips and knees.  Skin: Intact, no ulcerations or rash noted.  Psych: Good eye contact,  Memory intact  anxious not depressed appearing.  CNS: CN 2-12 intact, power,  normal throughout.no focal deficits noted.   Assessment & Plan  GAD (generalized anxiety disorder) Start buspar 5 mg every 8 hours , f/u with PCP for re evaluation Continue therapy Advised to eliminate caffeine  Panic attacks Reports panic attack occurring  with the severity that required her to go to the ED once and also reports increased debilitation as far as ability to grocery shop and to drive short distances , may need short acting benzo ( does not want that)as well but will defer to PCP/ psych

## 2022-12-06 ENCOUNTER — Encounter: Payer: Self-pay | Admitting: Family Medicine

## 2022-12-06 DIAGNOSIS — F410 Panic disorder [episodic paroxysmal anxiety] without agoraphobia: Secondary | ICD-10-CM | POA: Insufficient documentation

## 2022-12-06 DIAGNOSIS — F411 Generalized anxiety disorder: Secondary | ICD-10-CM | POA: Insufficient documentation

## 2022-12-06 NOTE — Assessment & Plan Note (Addendum)
Start buspar 5 mg every 8 hours , f/u with PCP for re evaluation Continue therapy Advised to eliminate caffeine

## 2022-12-06 NOTE — Assessment & Plan Note (Signed)
Reports panic attack occurring with the severity that required her to go to the ED once and also reports increased debilitation as far as ability to grocery shop and to drive short distances , may need short acting benzo ( does not want that)as well but will defer to PCP/ psych

## 2023-01-04 ENCOUNTER — Encounter: Payer: Self-pay | Admitting: Professional

## 2023-01-04 ENCOUNTER — Ambulatory Visit (INDEPENDENT_AMBULATORY_CARE_PROVIDER_SITE_OTHER): Payer: BC Managed Care – PPO | Admitting: Professional

## 2023-01-04 DIAGNOSIS — F411 Generalized anxiety disorder: Secondary | ICD-10-CM

## 2023-01-04 NOTE — Progress Notes (Signed)
[facility] Counselor/Therapist Progress Note  Patient ID: MARLAYA TURCK, MRN: [id number],    Date: 01/04/2023  Time Spent: 39 minutes [id number]  Treatment Type: Individual Therapy  Risk Assessment: Danger to Self:  No Self-injurious Behavior: No Danger to Others: No  Subjective: This session was held via video teletherapy. The patient consented to video teletherapy and was located in her car during this session. She is aware it is the responsibility of the patient to secure confidentiality on her end of the session. The provider was in a private home office for the duration of this session.    The patient arrived on time for her webex appointment.  Issues: 1-anxiety a-patient continues to struggle with catastrophic thinking b-pt saw PCP who prescribed Buspar but pt did not fill, not willing to take medication c-pt admits to obsessively thinking about worst case scenarios -educated on steps to lessen  -pt aware of her behavior and admits that she is not trying that hard to control 2-derealization -educated -examples 3-coping -how to take control herself and not count on others to relieve -staying in the moment and taping into the senses  2023 Treatment Plan Problems Addressed  Anxiety Goals 1. Enhance ability to effectively cope with the full variety of life's worries and anxieties. 2. Learn and implement coping skills that result in a reduction of anxiety and worry, and improved daily functioning. 3. Reduce overall frequency, intensity, and duration of the anxiety so that daily functioning is not impaired. Objective Describe situations, thoughts, feelings, and actions associated with anxieties and worries, their impact on functioning, and attempts to resolve them. Target Date: 2023-12-03 Frequency: Biweekly  Progress: 0 Modality: individual  Related Interventions Ask the client to describe his/her past experiences of anxiety and their impact on functioning;  assess the focus, excessiveness, and uncontrollability of the worry and the type, frequency, intensity, and duration of his/her anxiety symptoms (consider using a structured interview such as The Anxiety Disorders Interview Schedule-Adult Version). Objective Learn and implement calming skills to reduce overall anxiety and manage anxiety symptoms. Target Date: 2023-12-03 Frequency: Biweekly  Progress: 0 Modality: individual  Related Interventions Teach the client calming/relaxation skills (e.g., applied relaxation, progressive muscle relaxation, cue controlled relaxation; mindful breathing; biofeedback) and how to discriminate better between relaxation and tension; teach the client how to apply these skills to his/her daily life (e.g., New Directions in Progressive Muscle Relaxation by Casper Harrison, and Hazlett-Stevens; Treating Generalized Anxiety Disorder by Rygh and Amparo Bristol). Assign the client homework each session in which he/she practices relaxation exercises daily, gradually applying them progressively from non-anxiety-provoking to anxiety-provoking situations; review and reinforce success while providing corrective feedback toward improvement. Assign the client to read about progressive muscle relaxation and other calming strategies in relevant books or treatment manuals (e.g., Progressive Relaxation Training by Gwynneth Aliment and Dani Gobble; Mastery of Your Anxiety and Worry: Workbook by Beckie Busing). Objective Verbalize an understanding of the role that cognitive biases play in excessive irrational worry and persistent anxiety symptoms. Target Date: 2023-12-03 Frequency: Biweekly  Progress: 0 Modality: individual  Related Interventions Discuss examples demonstrating that unrealistic worry typically overestimates the probability of threats and underestimates or overlooks the client's ability to manage realistic demands (or assign "Past Successful Anxiety Coping" in the Adult  Psychotherapy Homework Planner by [facility]). Assist the client in analyzing his/her worries by examining potential biases such as the probability of the negative expectation occurring, the real consequences of it occurring, his/her ability to control the outcome, the worst possible outcome,  and his/her ability to accept it (see "Analyze the Probability of a Feared Event" in the Adult Psychotherapy Homework Planner by Bryn Gulling; Cognitive Therapy of Anxiety Disorders by Alison Stalling). Help the client gain insight into the notion that worry may function as a form of avoidance of a feared problem and that it creates acute and chronic tension. Objective Identify, challenge, and replace biased, fearful self-talk with positive, realistic, and empowering self-talk. Target Date: 2023-12-03 Frequency: Biweekly  Progress: 0 Modality: individual  Related Interventions Explore the client's schema and self-talk that mediate his/her fear response; assist him/her in challenging the biases; replace the distorted messages with reality-based alternatives and positive, realistic self-talk that will increase his/her self-confidence in coping with irrational fears (see Cognitive Therapy of Anxiety Disorders by Alison Stalling). Assign the client a homework exercise in which he/she identifies fearful self-talk, identifies biases in the self-talk, generates alternatives, and tests through behavioral experiments (or assign "Negative Thoughts Trigger Negative Feelings" in the Adult Psychotherapy Homework Planner by [facility]); review and reinforce success, providing corrective feedback toward improvement. Objective Undergo gradual repeated imaginal exposure to the feared negative consequences predicted by worries and develop alternative reality-based predictions. Target Date: 2023-12-03 Frequency: Biweekly  Progress: 0 Modality: individual  Related Interventions Direct and assist the client in constructing a hierarchy of two to  three spheres of worry for use in exposure (e.g., worry about harm to others, financial difficulties, relationship problems). Ask the client to vividly imagine worst-case consequences of worries, holding them in mind until anxiety associated with them weakens (up to 30 minutes); generate reality-based alternatives to that worst case and process them (see Mastery of Your Anxiety and Worry: Therapist Guide by Hans Eden). Assign the client a homework exercise in which he/she does worry exposures and records responses (see Mastery of Your Anxiety and Worry: Workbook by Adora Fridge and Eliot Ford or Generalized Anxiety Disorder by Eather Colas, and Eliot Ford); review, reinforce success, and provide corrective feedback toward improvement. Objective Learn and implement relapse prevention strategies for managing possible future anxiety symptoms. Target Date: 2023-12-03 Frequency: Biweekly  Progress: 0 Modality: individual  Related Interventions Discuss with the client the distinction between a lapse and relapse, associating a lapse with an initial and reversible return of worry, anxiety symptoms, or urges to avoid, and relapse with the decision to continue the fearful and avoidant patterns. Identify and rehearse with the client the management of future situations or circumstances in which lapses could occur. Instruct the client to routinely use new therapeutic skills (e.g., relaxation, cognitive restructuring, exposure, and problem-solving) in daily life to address emergent worries, anxiety, and avoidant tendencies. Develop a "coping card" on which coping strategies and other important information (e.g., "Breathe deeply and relax," "Challenge unrealistic worries," "Use problem-solving") are written for the client's later use. Schedule periodic "maintenance" sessions to help the client maintain therapeutic gains. Objective Learn to accept limitations in life and commit to tolerating, rather than  avoiding, unpleasant emotions while accomplishing meaningful goals. Target Date: 2023-12-03 Frequency: Biweekly  Progress: 0 Modality: individual  Related Interventions Use techniques from Acceptance and Commitment Therapy to help client accept uncomfortable realities such as lack of complete control, imperfections, and uncertainty and tolerate unpleasant emotions and thoughts in order to accomplish value-consistent goals. 4. Stabilize anxiety level while increasing ability to function on a daily basis.  Diagnosis:Generalized anxiety disorder  Plan:  -practice consistency in remaining in the moment. -meet again on Monday, January 18, 2023 at 9am.

## 2023-01-18 ENCOUNTER — Ambulatory Visit (INDEPENDENT_AMBULATORY_CARE_PROVIDER_SITE_OTHER): Payer: BC Managed Care – PPO | Admitting: Professional

## 2023-01-18 ENCOUNTER — Encounter: Payer: Self-pay | Admitting: Professional

## 2023-01-18 DIAGNOSIS — F4001 Agoraphobia with panic disorder: Secondary | ICD-10-CM

## 2023-01-18 DIAGNOSIS — F411 Generalized anxiety disorder: Secondary | ICD-10-CM | POA: Diagnosis not present

## 2023-01-18 NOTE — Progress Notes (Signed)
[facility] Counselor/Therapist Progress Note  Patient ID: Lindsey Jensen, MRN: [id number],    Date: 01/18/2023  Time Spent: 50 minutes 903-953am  Treatment Type: Individual Therapy  Risk Assessment: Danger to Self:  No Self-injurious Behavior: No Danger to Others: No  Subjective: This session was held via video teletherapy. The patient consented to video teletherapy and was located in her car during this session. She is aware it is the responsibility of the patient to secure confidentiality on her end of the session. The provider was in a private home office for the duration of this session.    The patient arrived on time for her webex appointment.  Issues: 1-homework- completed, ongoing -practice consistency in remaining in the moment. -intentional playtime with son 2-anxiety a-desensitization process -educated -examples  December 2023 Treatment Plan Problems Addressed  Anxiety Goals 1. Enhance ability to effectively cope with the full variety of life's worries and anxieties. 2. Learn and implement coping skills that result in a reduction of anxiety and worry, and improved daily functioning. 3. Reduce overall frequency, intensity, and duration of the anxiety so that daily functioning is not impaired. Objective Describe situations, thoughts, feelings, and actions associated with anxieties and worries, their impact on functioning, and attempts to resolve them. Target Date: 2023-12-03 Frequency: Biweekly  Progress: 0 Modality: individual  Related Interventions Ask the client to describe his/her past experiences of anxiety and their impact on functioning; assess the focus, excessiveness, and uncontrollability of the worry and the type, frequency, intensity, and duration of his/her anxiety symptoms (consider using a structured interview such as The Anxiety Disorders Interview Schedule-Adult Version). Objective Learn and implement calming skills to reduce overall  anxiety and manage anxiety symptoms. Target Date: 2023-12-03 Frequency: Biweekly  Progress: 0 Modality: individual  Related Interventions Teach the client calming/relaxation skills (e.g., applied relaxation, progressive muscle relaxation, cue controlled relaxation; mindful breathing; biofeedback) and how to discriminate better between relaxation and tension; teach the client how to apply these skills to his/her daily life (e.g., New Directions in Progressive Muscle Relaxation by Casper Harrison, and Hazlett-Stevens; Treating Generalized Anxiety Disorder by Rygh and Amparo Bristol). Assign the client homework each session in which he/she practices relaxation exercises daily, gradually applying them progressively from non-anxiety-provoking to anxiety-provoking situations; review and reinforce success while providing corrective feedback toward improvement. Assign the client to read about progressive muscle relaxation and other calming strategies in relevant books or treatment manuals (e.g., Progressive Relaxation Training by Gwynneth Aliment and Dani Gobble; Mastery of Your Anxiety and Worry: Workbook by Beckie Busing). Objective Verbalize an understanding of the role that cognitive biases play in excessive irrational worry and persistent anxiety symptoms. Target Date: 2023-12-03 Frequency: Biweekly  Progress: 0 Modality: individual  Related Interventions Discuss examples demonstrating that unrealistic worry typically overestimates the probability of threats and underestimates or overlooks the client's ability to manage realistic demands (or assign "Past Successful Anxiety Coping" in the Adult Psychotherapy Homework Planner by [facility]). Assist the client in analyzing his/her worries by examining potential biases such as the probability of the negative expectation occurring, the real consequences of it occurring, his/her ability to control the outcome, the worst possible outcome, and his/her ability to accept it  (see "Analyze the Probability of a Feared Event" in the Adult Psychotherapy Homework Planner by Bryn Gulling; Cognitive Therapy of Anxiety Disorders by Alison Stalling). Help the client gain insight into the notion that worry may function as a form of avoidance of a feared problem and that it creates acute and chronic tension.  Objective Identify, challenge, and replace biased, fearful self-talk with positive, realistic, and empowering self-talk. Target Date: 2023-12-03 Frequency: Biweekly  Progress: 0 Modality: individual  Related Interventions Explore the client's schema and self-talk that mediate his/her fear response; assist him/her in challenging the biases; replace the distorted messages with reality-based alternatives and positive, realistic self-talk that will increase his/her self-confidence in coping with irrational fears (see Cognitive Therapy of Anxiety Disorders by Alison Stalling). Assign the client a homework exercise in which he/she identifies fearful self-talk, identifies biases in the self-talk, generates alternatives, and tests through behavioral experiments (or assign "Negative Thoughts Trigger Negative Feelings" in the Adult Psychotherapy Homework Planner by [facility]); review and reinforce success, providing corrective feedback toward improvement. Objective Undergo gradual repeated imaginal exposure to the feared negative consequences predicted by worries and develop alternative reality-based predictions. Target Date: 2023-12-03 Frequency: Biweekly  Progress: 0 Modality: individual  Related Interventions Direct and assist the client in constructing a hierarchy of two to three spheres of worry for use in exposure (e.g., worry about harm to others, financial difficulties, relationship problems). Ask the client to vividly imagine worst-case consequences of worries, holding them in mind until anxiety associated with them weakens (up to 30 minutes); generate reality-based alternatives to that  worst case and process them (see Mastery of Your Anxiety and Worry: Therapist Guide by Hans Eden). Assign the client a homework exercise in which he/she does worry exposures and records responses (see Mastery of Your Anxiety and Worry: Workbook by Adora Fridge and Eliot Ford or Generalized Anxiety Disorder by Eather Colas, and Eliot Ford); review, reinforce success, and provide corrective feedback toward improvement. Objective Learn and implement relapse prevention strategies for managing possible future anxiety symptoms. Target Date: 2023-12-03 Frequency: Biweekly  Progress: 0 Modality: individual  Related Interventions Discuss with the client the distinction between a lapse and relapse, associating a lapse with an initial and reversible return of worry, anxiety symptoms, or urges to avoid, and relapse with the decision to continue the fearful and avoidant patterns. Identify and rehearse with the client the management of future situations or circumstances in which lapses could occur. Instruct the client to routinely use new therapeutic skills (e.g., relaxation, cognitive restructuring, exposure, and problem-solving) in daily life to address emergent worries, anxiety, and avoidant tendencies. Develop a "coping card" on which coping strategies and other important information (e.g., "Breathe deeply and relax," "Challenge unrealistic worries," "Use problem-solving") are written for the client's later use. Schedule periodic "maintenance" sessions to help the client maintain therapeutic gains. Objective Learn to accept limitations in life and commit to tolerating, rather than avoiding, unpleasant emotions while accomplishing meaningful goals. Target Date: 2023-12-03 Frequency: Biweekly  Progress: 0 Modality: individual  Related Interventions Use techniques from Acceptance and Commitment Therapy to help client accept uncomfortable realities such as lack of complete control, imperfections, and  uncertainty and tolerate unpleasant emotions and thoughts in order to accomplish value-consistent goals. 4. Stabilize anxiety level while increasing ability to function on a daily basis.  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

## 2023-02-01 ENCOUNTER — Encounter: Payer: Self-pay | Admitting: Professional

## 2023-02-01 ENCOUNTER — Ambulatory Visit (INDEPENDENT_AMBULATORY_CARE_PROVIDER_SITE_OTHER): Payer: BC Managed Care – PPO | Admitting: Professional

## 2023-02-01 DIAGNOSIS — F411 Generalized anxiety disorder: Secondary | ICD-10-CM | POA: Diagnosis not present

## 2023-02-01 NOTE — Progress Notes (Signed)
[facility] Counselor/Therapist Progress Note  Patient ID: Lindsey Jensen, MRN: [id number],    Date: 02/01/2023  Time Spent: 52 minutes 903-955am  Treatment Type: Individual Therapy  Risk Assessment: Danger to Self:  No Self-injurious Behavior: No Danger to Others: No  Subjective: This session was held via video teletherapy. The patient consented to video teletherapy and was located in her car during this session. She is aware it is the responsibility of the patient to secure confidentiality on her end of the session. The provider was in a private home office for the duration of this session.    The patient arrived on time for her webex appointment.  Issues: 1-homework- did not complete, initially avoiding first time but sat in car and let him go -practice step by step going to [facility] beginning with driving past, driving into, and sitting in the parking lot 2-anxiety -"my body remembers to get anxious" -it happens between meals -pt recalls a past when she had a sugar drop that caused the shaky feeling -pt was checking blood sugar between meals for fear that it could be low -derealization-depersonalization -psychological distancing techniques -taking control of your anxiety  December 2023 Treatment Plan Problems Addressed  Anxiety Goals 1. Enhance ability to effectively cope with the full variety of life's worries and anxieties. 2. Learn and implement coping skills that result in a reduction of anxiety and worry, and improved daily functioning. 3. Reduce overall frequency, intensity, and duration of the anxiety so that daily functioning is not impaired. Objective Describe situations, thoughts, feelings, and actions associated with anxieties and worries, their impact on functioning, and attempts to resolve them. Target Date: 2023-12-03 Frequency: Biweekly  Progress: 0 Modality: individual  Related Interventions Ask the client to describe his/her past experiences of  anxiety and their impact on functioning; assess the focus, excessiveness, and uncontrollability of the worry and the type, frequency, intensity, and duration of his/her anxiety symptoms (consider using a structured interview such as The Anxiety Disorders Interview Schedule-Adult Version). Objective Learn and implement calming skills to reduce overall anxiety and manage anxiety symptoms. Target Date: 2023-12-03 Frequency: Biweekly  Progress: 0 Modality: individual  Related Interventions Teach the client calming/relaxation skills (e.g., applied relaxation, progressive muscle relaxation, cue controlled relaxation; mindful breathing; biofeedback) and how to discriminate better between relaxation and tension; teach the client how to apply these skills to his/her daily life (e.g., New Directions in Progressive Muscle Relaxation by Casper Harrison, and Hazlett-Stevens; Treating Generalized Anxiety Disorder by Rygh and Amparo Bristol). Assign the client homework each session in which he/she practices relaxation exercises daily, gradually applying them progressively from non-anxiety-provoking to anxiety-provoking situations; review and reinforce success while providing corrective feedback toward improvement. Assign the client to read about progressive muscle relaxation and other calming strategies in relevant books or treatment manuals (e.g., Progressive Relaxation Training by Gwynneth Aliment and Dani Gobble; Mastery of Your Anxiety and Worry: Workbook by Beckie Busing). Objective Verbalize an understanding of the role that cognitive biases play in excessive irrational worry and persistent anxiety symptoms. Target Date: 2023-12-03 Frequency: Biweekly  Progress: 0 Modality: individual  Related Interventions Discuss examples demonstrating that unrealistic worry typically overestimates the probability of threats and underestimates or overlooks the client's ability to manage realistic demands (or assign "Past Successful  Anxiety Coping" in the Adult Psychotherapy Homework Planner by [facility]). Assist the client in analyzing his/her worries by examining potential biases such as the probability of the negative expectation occurring, the real consequences of it occurring, his/her ability to control the  outcome, the worst possible outcome, and his/her ability to accept it (see "Analyze the Probability of a Feared Event" in the Adult Psychotherapy Homework Planner by Bryn Gulling; Cognitive Therapy of Anxiety Disorders by Alison Stalling). Help the client gain insight into the notion that worry may function as a form of avoidance of a feared problem and that it creates acute and chronic tension. Objective Identify, challenge, and replace biased, fearful self-talk with positive, realistic, and empowering self-talk. Target Date: 2023-12-03 Frequency: Biweekly  Progress: 0 Modality: individual  Related Interventions Explore the client's schema and self-talk that mediate his/her fear response; assist him/her in challenging the biases; replace the distorted messages with reality-based alternatives and positive, realistic self-talk that will increase his/her self-confidence in coping with irrational fears (see Cognitive Therapy of Anxiety Disorders by Alison Stalling). Assign the client a homework exercise in which he/she identifies fearful self-talk, identifies biases in the self-talk, generates alternatives, and tests through behavioral experiments (or assign "Negative Thoughts Trigger Negative Feelings" in the Adult Psychotherapy Homework Planner by [facility]); review and reinforce success, providing corrective feedback toward improvement. Objective Undergo gradual repeated imaginal exposure to the feared negative consequences predicted by worries and develop alternative reality-based predictions. Target Date: 2023-12-03 Frequency: Biweekly  Progress: 0 Modality: individual  Related Interventions Direct and assist the client in  constructing a hierarchy of two to three spheres of worry for use in exposure (e.g., worry about harm to others, financial difficulties, relationship problems). Ask the client to vividly imagine worst-case consequences of worries, holding them in mind until anxiety associated with them weakens (up to 30 minutes); generate reality-based alternatives to that worst case and process them (see Mastery of Your Anxiety and Worry: Therapist Guide by Hans Eden). Assign the client a homework exercise in which he/she does worry exposures and records responses (see Mastery of Your Anxiety and Worry: Workbook by Adora Fridge and Eliot Ford or Generalized Anxiety Disorder by Eather Colas, and Eliot Ford); review, reinforce success, and provide corrective feedback toward improvement. Objective Learn and implement relapse prevention strategies for managing possible future anxiety symptoms. Target Date: 2023-12-03 Frequency: Biweekly  Progress: 0 Modality: individual  Related Interventions Discuss with the client the distinction between a lapse and relapse, associating a lapse with an initial and reversible return of worry, anxiety symptoms, or urges to avoid, and relapse with the decision to continue the fearful and avoidant patterns. Identify and rehearse with the client the management of future situations or circumstances in which lapses could occur. Instruct the client to routinely use new therapeutic skills (e.g., relaxation, cognitive restructuring, exposure, and problem-solving) in daily life to address emergent worries, anxiety, and avoidant tendencies. Develop a "coping card" on which coping strategies and other important information (e.g., "Breathe deeply and relax," "Challenge unrealistic worries," "Use problem-solving") are written for the client's later use. Schedule periodic "maintenance" sessions to help the client maintain therapeutic gains. Objective Learn to accept limitations in life and commit  to tolerating, rather than avoiding, unpleasant emotions while accomplishing meaningful goals. Target Date: 2023-12-03 Frequency: Biweekly  Progress: 0 Modality: individual  Related Interventions Use techniques from Acceptance and Commitment Therapy to help client accept uncomfortable realities such as lack of complete control, imperfections, and uncertainty and tolerate unpleasant emotions and thoughts in order to accomplish value-consistent goals. 4. Stabilize anxiety level while increasing ability to function on a daily basis.  Diagnosis:Generalized anxiety disorder  Plan:  -create written plan to stay focused and move away from being pre-occupied with anxiety -meet again on  Monday, March, 2024 at 10am.

## 2023-02-03 DIAGNOSIS — J029 Acute pharyngitis, unspecified: Secondary | ICD-10-CM | POA: Diagnosis not present

## 2023-02-03 DIAGNOSIS — J019 Acute sinusitis, unspecified: Secondary | ICD-10-CM | POA: Diagnosis not present

## 2023-02-03 DIAGNOSIS — J069 Acute upper respiratory infection, unspecified: Secondary | ICD-10-CM | POA: Diagnosis not present

## 2023-02-15 ENCOUNTER — Ambulatory Visit: Payer: BC Managed Care – PPO | Admitting: Professional

## 2023-02-18 ENCOUNTER — Other Ambulatory Visit: Payer: Self-pay

## 2023-02-18 DIAGNOSIS — D751 Secondary polycythemia: Secondary | ICD-10-CM

## 2023-02-19 ENCOUNTER — Inpatient Hospital Stay: Payer: BC Managed Care – PPO | Attending: Hematology

## 2023-02-19 ENCOUNTER — Inpatient Hospital Stay: Payer: BC Managed Care – PPO

## 2023-02-19 DIAGNOSIS — D751 Secondary polycythemia: Secondary | ICD-10-CM | POA: Diagnosis not present

## 2023-02-19 DIAGNOSIS — F17210 Nicotine dependence, cigarettes, uncomplicated: Secondary | ICD-10-CM | POA: Insufficient documentation

## 2023-02-19 DIAGNOSIS — D582 Other hemoglobinopathies: Secondary | ICD-10-CM

## 2023-02-19 LAB — CBC
HCT: 52.7 % — ABNORMAL HIGH (ref 36.0–46.0)
Hemoglobin: 17.7 g/dL — ABNORMAL HIGH (ref 12.0–15.0)
MCH: 32.2 pg (ref 26.0–34.0)
MCHC: 33.6 g/dL (ref 30.0–36.0)
MCV: 95.8 fL (ref 80.0–100.0)
Platelets: 130 10*3/uL — ABNORMAL LOW (ref 150–400)
RBC: 5.5 MIL/uL — ABNORMAL HIGH (ref 3.87–5.11)
RDW: 15.7 % — ABNORMAL HIGH (ref 11.5–15.5)
WBC: 6.4 10*3/uL (ref 4.0–10.5)
nRBC: 0 % (ref 0.0–0.2)

## 2023-02-19 NOTE — Patient Instructions (Signed)
[facility]  Discharge Instructions: Thank you for choosing [facility] to provide your oncology and hematology care.  If you have a lab appointment with the [facility], please come in thru the Main Entrance and check in at the main information desk.  Wear comfortable clothing and clothing appropriate for easy access to any Portacath or PICC line.   We strive to give you quality time with your provider. You may need to reschedule your appointment if you arrive late (15 or more minutes).  Arriving late affects you and other patients whose appointments are after yours.  Also, if you miss three or more appointments without notifying the office, you may be dismissed from the clinic at the provider's discretion.      For prescription refill requests, have your pharmacy contact our office and allow 72 hours for refills to be completed.    Today you received the following chemotherapy and/or immunotherapy agents phlebotomy      To help prevent nausea and vomiting after your treatment, we encourage you to take your nausea medication as directed.  BELOW ARE SYMPTOMS THAT SHOULD BE REPORTED IMMEDIATELY: *FEVER GREATER THAN 100.4 F (38 C) OR HIGHER *CHILLS OR SWEATING *NAUSEA AND VOMITING THAT IS NOT CONTROLLED WITH YOUR NAUSEA MEDICATION *UNUSUAL SHORTNESS OF BREATH *UNUSUAL BRUISING OR BLEEDING *URINARY PROBLEMS (pain or burning when urinating, or frequent urination) *BOWEL PROBLEMS (unusual diarrhea, constipation, pain near the anus) TENDERNESS IN MOUTH AND THROAT WITH OR WITHOUT PRESENCE OF ULCERS (sore throat, sores in mouth, or a toothache) UNUSUAL RASH, SWELLING OR PAIN  UNUSUAL VAGINAL DISCHARGE OR ITCHING   Items with * indicate a potential emergency and should be followed up as soon as possible or go to the Emergency Department if any problems should occur.  Please show the CHEMOTHERAPY ALERT CARD or IMMUNOTHERAPY ALERT CARD at check-in to the  Emergency Department and triage nurse.  Should you have questions after your visit or need to cancel or reschedule your appointment, please contact [facility] [phone]  and follow the prompts.  Office hours are 8:00 a.m. to 4:30 p.m. Monday - Friday. Please note that voicemails left after 4:00 p.m. may not be returned until the following business day.  We are closed weekends and major holidays. You have access to a nurse at all times for urgent questions. Please call the main number to the clinic [phone] and follow the prompts.  For any non-urgent questions, you may also contact your provider using MyChart. We now offer e-Visits for anyone 28 and older to request care online for non-urgent symptoms. For details visit [email or web address].   Also download the MyChart app! Go to the app store, search "MyChart", open the app, select [facility], and log in with your MyChart username and password.

## 2023-02-19 NOTE — Progress Notes (Signed)
Patient presents today for phlebotomy per MD orders. Phlebotomy procedure started at 1328 and ended at 1338. 500 cc removed. Patient tolerated procedure well. Procedure tolerated well and without incident. Discharged ambulatory in stable condition.

## 2023-02-22 ENCOUNTER — Encounter: Payer: Self-pay | Admitting: Professional

## 2023-02-22 ENCOUNTER — Ambulatory Visit (INDEPENDENT_AMBULATORY_CARE_PROVIDER_SITE_OTHER): Payer: BC Managed Care – PPO | Admitting: Professional

## 2023-02-22 DIAGNOSIS — F411 Generalized anxiety disorder: Secondary | ICD-10-CM

## 2023-02-22 NOTE — Progress Notes (Signed)
[facility] Counselor/Therapist Progress Note  Patient ID: KRYSTENA THILGES, MRN: [id number],    Date: 02/22/2023  Time Spent: 44 minutes 10-1044am  Treatment Type: Individual Therapy  Risk Assessment: Danger to Self:  No Self-injurious Behavior: No Danger to Others: No  Subjective: This session was held via video teletherapy. The patient consented to video teletherapy and was located in her car during this session. She is aware it is the responsibility of the patient to secure confidentiality on her end of the session. The provider was in a private home office for the duration of this session.    The patient arrived on time for her webex appointment.  Issues: 1-homework- did not write down but worked ot not focus on it, has been more intentional -create written plan to stay focused and move away from being pre-occupied with anxiety 2-anxiety a-pt went to three different stores on three different occasions b-drove around the [place] parking lot c-has been trying to be more active d-2nd time in store husband told her she could not go back to car -he took her keys e-it has gotten so difficult at home that she cannot stand to be home alone 3-marital a-spouse told her he can no longer stop living his life for her anxiety b-he started reading online of how to help a wife with anxiety c-he told her to imagine her feet glued to the floor when she imagines herself wanting to leave a situation d-pt did not understand the gravity of her behavior on him e-spouse was very transparent about his feelings that everything is about her f-the impact on her son Laurell Roof has recently been noticeable  -he wanted to hold her hand so she could breath -he confronted her about yelling at him when she was overwhelmed -she is hurt for her son that he has picked up on her emotions  December 2023 Treatment Plan Problems Addressed  Anxiety Goals 1. Enhance ability to effectively cope with the full  variety of life's worries and anxieties. 2. Learn and implement coping skills that result in a reduction of anxiety and worry, and improved daily functioning. 3. Reduce overall frequency, intensity, and duration of the anxiety so that daily functioning is not impaired. Objective Describe situations, thoughts, feelings, and actions associated with anxieties and worries, their impact on functioning, and attempts to resolve them. Target Date: 2023-12-03 Frequency: Biweekly  Progress: 0 Modality: individual  Related Interventions Ask the client to describe his/her past experiences of anxiety and their impact on functioning; assess the focus, excessiveness, and uncontrollability of the worry and the type, frequency, intensity, and duration of his/her anxiety symptoms (consider using a structured interview such as The Anxiety Disorders Interview Schedule-Adult Version). Objective Learn and implement calming skills to reduce overall anxiety and manage anxiety symptoms. Target Date: 2023-12-03 Frequency: Biweekly  Progress: 0 Modality: individual  Related Interventions Teach the client calming/relaxation skills (e.g., applied relaxation, progressive muscle relaxation, cue controlled relaxation; mindful breathing; biofeedback) and how to discriminate better between relaxation and tension; teach the client how to apply these skills to his/her daily life (e.g., New Directions in Progressive Muscle Relaxation by Casper Harrison, and Hazlett-Stevens; Treating Generalized Anxiety Disorder by Rygh and Amparo Bristol). Assign the client homework each session in which he/she practices relaxation exercises daily, gradually applying them progressively from non-anxiety-provoking to anxiety-provoking situations; review and reinforce success while providing corrective feedback toward improvement. Assign the client to read about progressive muscle relaxation and other calming strategies in relevant books or treatment  manuals (  e.g., Progressive Relaxation Training by Leroy Kennedy; Mastery of Your Anxiety and Worry: Workbook by Beckie Busing). Objective Verbalize an understanding of the role that cognitive biases play in excessive irrational worry and persistent anxiety symptoms. Target Date: 2023-12-03 Frequency: Biweekly  Progress: 0 Modality: individual  Related Interventions Discuss examples demonstrating that unrealistic worry typically overestimates the probability of threats and underestimates or overlooks the client's ability to manage realistic demands (or assign "Past Successful Anxiety Coping" in the Adult Psychotherapy Homework Planner by [facility]). Assist the client in analyzing his/her worries by examining potential biases such as the probability of the negative expectation occurring, the real consequences of it occurring, his/her ability to control the outcome, the worst possible outcome, and his/her ability to accept it (see "Analyze the Probability of a Feared Event" in the Adult Psychotherapy Homework Planner by Bryn Gulling; Cognitive Therapy of Anxiety Disorders by Alison Stalling). Help the client gain insight into the notion that worry may function as a form of avoidance of a feared problem and that it creates acute and chronic tension. Objective Identify, challenge, and replace biased, fearful self-talk with positive, realistic, and empowering self-talk. Target Date: 2023-12-03 Frequency: Biweekly  Progress: 0 Modality: individual  Related Interventions Explore the client's schema and self-talk that mediate his/her fear response; assist him/her in challenging the biases; replace the distorted messages with reality-based alternatives and positive, realistic self-talk that will increase his/her self-confidence in coping with irrational fears (see Cognitive Therapy of Anxiety Disorders by Alison Stalling). Assign the client a homework exercise in which he/she identifies fearful self-talk,  identifies biases in the self-talk, generates alternatives, and tests through behavioral experiments (or assign "Negative Thoughts Trigger Negative Feelings" in the Adult Psychotherapy Homework Planner by [facility]); review and reinforce success, providing corrective feedback toward improvement. Objective Undergo gradual repeated imaginal exposure to the feared negative consequences predicted by worries and develop alternative reality-based predictions. Target Date: 2023-12-03 Frequency: Biweekly  Progress: 0 Modality: individual  Related Interventions Direct and assist the client in constructing a hierarchy of two to three spheres of worry for use in exposure (e.g., worry about harm to others, financial difficulties, relationship problems). Ask the client to vividly imagine worst-case consequences of worries, holding them in mind until anxiety associated with them weakens (up to 30 minutes); generate reality-based alternatives to that worst case and process them (see Mastery of Your Anxiety and Worry: Therapist Guide by Hans Eden). Assign the client a homework exercise in which he/she does worry exposures and records responses (see Mastery of Your Anxiety and Worry: Workbook by Adora Fridge and Eliot Ford or Generalized Anxiety Disorder by Eather Colas, and Eliot Ford); review, reinforce success, and provide corrective feedback toward improvement. Objective Learn and implement relapse prevention strategies for managing possible future anxiety symptoms. Target Date: 2023-12-03 Frequency: Biweekly  Progress: 0 Modality: individual  Related Interventions Discuss with the client the distinction between a lapse and relapse, associating a lapse with an initial and reversible return of worry, anxiety symptoms, or urges to avoid, and relapse with the decision to continue the fearful and avoidant patterns. Identify and rehearse with the client the management of future situations or circumstances in which  lapses could occur. Instruct the client to routinely use new therapeutic skills (e.g., relaxation, cognitive restructuring, exposure, and problem-solving) in daily life to address emergent worries, anxiety, and avoidant tendencies. Develop a "coping card" on which coping strategies and other important information (e.g., "Breathe deeply and relax," "Challenge unrealistic worries," "Use problem-solving") are written for  the client's later use. Schedule periodic "maintenance" sessions to help the client maintain therapeutic gains. Objective Learn to accept limitations in life and commit to tolerating, rather than avoiding, unpleasant emotions while accomplishing meaningful goals. Target Date: 2023-12-03 Frequency: Biweekly  Progress: 0 Modality: individual  Related Interventions Use techniques from Acceptance and Commitment Therapy to help client accept uncomfortable realities such as lack of complete control, imperfections, and uncertainty and tolerate unpleasant emotions and thoughts in order to accomplish value-consistent goals. 4. Stabilize anxiety level while increasing ability to function on a daily basis.  Diagnosis:Generalized anxiety disorder  Plan:  -"I will walk into [facility] and buy something" -"I will drive my son to school or pick him up from school one day with my husband" -meet again on Monday, March 01, 2023 at [facility].

## 2023-03-01 ENCOUNTER — Encounter: Payer: Self-pay | Admitting: Professional

## 2023-03-01 ENCOUNTER — Ambulatory Visit (INDEPENDENT_AMBULATORY_CARE_PROVIDER_SITE_OTHER): Payer: BC Managed Care – PPO | Admitting: Professional

## 2023-03-01 DIAGNOSIS — F411 Generalized anxiety disorder: Secondary | ICD-10-CM

## 2023-03-01 NOTE — Progress Notes (Signed)
[facility] Counselor/Therapist Progress Note  Patient ID: Lindsey Jensen, MRN: [id number],    Date: 03/01/2023  Time Spent: 51 minutes 901-952am  Treatment Type: Individual Therapy  Risk Assessment: Danger to Self:  No Self-injurious Behavior: No Danger to Others: No  Subjective: This session was held via video teletherapy. The patient consented to video teletherapy and was located in her car during this session. She is aware it is the responsibility of the patient to secure confidentiality on her end of the session. The provider was in a private home office for the duration of this session.    The patient arrived on time for her webex appointment.  Issues: 1-homework- completed, and exceeded -"I will walk into [employer] and buy something" -"I will drive my son to school or pick him up from school one day with my husband" 2-anxiety -did not experience the anxiety she expected and has in the past -daily commitment to better manage her anxiety -can versus cannot 3-parenting -how to set limits   December 2023 Treatment Plan Problems Addressed  Anxiety Goals 1. Enhance ability to effectively cope with the full variety of life's worries and anxieties. 2. Learn and implement coping skills that result in a reduction of anxiety and worry, and improved daily functioning. 3. Reduce overall frequency, intensity, and duration of the anxiety so that daily functioning is not impaired. Objective Describe situations, thoughts, feelings, and actions associated with anxieties and worries, their impact on functioning, and attempts to resolve them. Target Date: 2023-12-03 Frequency: Biweekly  Progress: 0 Modality: individual  Related Interventions Ask the client to describe his/her past experiences of anxiety and their impact on functioning; assess the focus, excessiveness, and uncontrollability of the worry and the type, frequency, intensity, and duration of his/her anxiety symptoms  (consider using a structured interview such as The Anxiety Disorders Interview Schedule-Adult Version). Objective Learn and implement calming skills to reduce overall anxiety and manage anxiety symptoms. Target Date: 2023-12-03 Frequency: Biweekly  Progress: 0 Modality: individual  Related Interventions Teach the client calming/relaxation skills (e.g., applied relaxation, progressive muscle relaxation, cue controlled relaxation; mindful breathing; biofeedback) and how to discriminate better between relaxation and tension; teach the client how to apply these skills to his/her daily life (e.g., New Directions in Progressive Muscle Relaxation by Casper Harrison, and Hazlett-Stevens; Treating Generalized Anxiety Disorder by Rygh and Amparo Bristol). Assign the client homework each session in which he/she practices relaxation exercises daily, gradually applying them progressively from non-anxiety-provoking to anxiety-provoking situations; review and reinforce success while providing corrective feedback toward improvement. Assign the client to read about progressive muscle relaxation and other calming strategies in relevant books or treatment manuals (e.g., Progressive Relaxation Training by Gwynneth Aliment and Dani Gobble; Mastery of Your Anxiety and Worry: Workbook by Beckie Busing). Objective Verbalize an understanding of the role that cognitive biases play in excessive irrational worry and persistent anxiety symptoms. Target Date: 2023-12-03 Frequency: Biweekly  Progress: 0 Modality: individual  Related Interventions Discuss examples demonstrating that unrealistic worry typically overestimates the probability of threats and underestimates or overlooks the client's ability to manage realistic demands (or assign "Past Successful Anxiety Coping" in the Adult Psychotherapy Homework Planner by [facility]). Assist the client in analyzing his/her worries by examining potential biases such as the probability of the  negative expectation occurring, the real consequences of it occurring, his/her ability to control the outcome, the worst possible outcome, and his/her ability to accept it (see "Analyze the Probability of a Feared Event" in the Adult Psychotherapy Homework Planner  by Bryn Gulling; Cognitive Therapy of Anxiety Disorders by Alison Stalling). Help the client gain insight into the notion that worry may function as a form of avoidance of a feared problem and that it creates acute and chronic tension. Objective Identify, challenge, and replace biased, fearful self-talk with positive, realistic, and empowering self-talk. Target Date: 2023-12-03 Frequency: Biweekly  Progress: 0 Modality: individual  Related Interventions Explore the client's schema and self-talk that mediate his/her fear response; assist him/her in challenging the biases; replace the distorted messages with reality-based alternatives and positive, realistic self-talk that will increase his/her self-confidence in coping with irrational fears (see Cognitive Therapy of Anxiety Disorders by Alison Stalling). Assign the client a homework exercise in which he/she identifies fearful self-talk, identifies biases in the self-talk, generates alternatives, and tests through behavioral experiments (or assign "Negative Thoughts Trigger Negative Feelings" in the Adult Psychotherapy Homework Planner by [facility]); review and reinforce success, providing corrective feedback toward improvement. Objective Undergo gradual repeated imaginal exposure to the feared negative consequences predicted by worries and develop alternative reality-based predictions. Target Date: 2023-12-03 Frequency: Biweekly  Progress: 0 Modality: individual  Related Interventions Direct and assist the client in constructing a hierarchy of two to three spheres of worry for use in exposure (e.g., worry about harm to others, financial difficulties, relationship problems). Ask the client to vividly  imagine worst-case consequences of worries, holding them in mind until anxiety associated with them weakens (up to 30 minutes); generate reality-based alternatives to that worst case and process them (see Mastery of Your Anxiety and Worry: Therapist Guide by Hans Eden). Assign the client a homework exercise in which he/she does worry exposures and records responses (see Mastery of Your Anxiety and Worry: Workbook by Adora Fridge and Eliot Ford or Generalized Anxiety Disorder by Eather Colas, and Eliot Ford); review, reinforce success, and provide corrective feedback toward improvement. Objective Learn and implement relapse prevention strategies for managing possible future anxiety symptoms. Target Date: 2023-12-03 Frequency: Biweekly  Progress: 0 Modality: individual  Related Interventions Discuss with the client the distinction between a lapse and relapse, associating a lapse with an initial and reversible return of worry, anxiety symptoms, or urges to avoid, and relapse with the decision to continue the fearful and avoidant patterns. Identify and rehearse with the client the management of future situations or circumstances in which lapses could occur. Instruct the client to routinely use new therapeutic skills (e.g., relaxation, cognitive restructuring, exposure, and problem-solving) in daily life to address emergent worries, anxiety, and avoidant tendencies. Develop a "coping card" on which coping strategies and other important information (e.g., "Breathe deeply and relax," "Challenge unrealistic worries," "Use problem-solving") are written for the client's later use. Schedule periodic "maintenance" sessions to help the client maintain therapeutic gains. Objective Learn to accept limitations in life and commit to tolerating, rather than avoiding, unpleasant emotions while accomplishing meaningful goals. Target Date: 2023-12-03 Frequency: Biweekly  Progress: 0 Modality: individual  Related  Interventions Use techniques from Acceptance and Commitment Therapy to help client accept uncomfortable realities such as lack of complete control, imperfections, and uncertainty and tolerate unpleasant emotions and thoughts in order to accomplish value-consistent goals. 4. Stabilize anxiety level while increasing ability to function on a daily basis.  Diagnosis:Generalized anxiety disorder  Plan:  -create a calendar for the month and begin using -meet again on Thursday, March 11, 2023 at 9am.

## 2023-03-11 ENCOUNTER — Ambulatory Visit (INDEPENDENT_AMBULATORY_CARE_PROVIDER_SITE_OTHER): Payer: BC Managed Care – PPO | Admitting: Professional

## 2023-03-11 ENCOUNTER — Encounter: Payer: Self-pay | Admitting: Professional

## 2023-03-11 DIAGNOSIS — F411 Generalized anxiety disorder: Secondary | ICD-10-CM

## 2023-03-11 NOTE — Progress Notes (Signed)
[place] Counselor/Therapist Progress Note  Patient ID: Lindsey Jensen, MRN: [id number],    Date: 03/11/2023  Time Spent: 41 minutes 901-942am  Treatment Type: Individual Therapy  Risk Assessment: Danger to Self:  No Self-injurious Behavior: No Danger to Others: No  Subjective: This session was held via video teletherapy. The patient consented to video teletherapy and was located in her car during this session. She is aware it is the responsibility of the patient to secure confidentiality on her end of the session. The provider was in a private home office for the duration of this session.    The patient arrived on time for her webex appointment.  Issues: 1-homework- work in progress --create a calendar for the month and begin using 2-[place] -was positive for flu -she was home all week last week 3-anxiety -pt has been able to stop the pattern -she has started to use deep breathing -she noticed she would start breathing quickly -visited with friends on Friday -on Saturday took [place] by herself to a birthday party -pt has gone into a few stores and has gone by herself -she feels proud of herself -her husband has noticed -she has spent so long being afraid of what might happen and it never does 4-next steps -pt thinks she needs to continue with what she has started -plans to drive longer distances and drive more often -stop making excuses and not a big production -pt feels confident that she will continue on this positive trajectory -"the things that I thought I couldn't do in the past few weeks I can do" -challenging irrational thoughts -pt has been able to notice when she feels overwhelmed and how to alter her behavior -pt reports she ignored -pt feels a level of confidence that she had not felt before 5-looking forward to their summer trip to [place] -last year her mother was overly dramatic and pt was anxious -her mother commented that pt's spouse almost  pulled her son's ear off   -in reality, her spouse tugged on his ear and said let's use these to refocus him -her mother causes her anxiety and the feeling of walking on eggshells  December 2023 Treatment Plan Problems Addressed  Anxiety Goals 1. Enhance ability to effectively cope with the full variety of life's worries and anxieties. 2. Learn and implement coping skills that result in a reduction of anxiety and worry, and improved daily functioning. 3. Reduce overall frequency, intensity, and duration of the anxiety so that daily functioning is not impaired. Objective Describe situations, thoughts, feelings, and actions associated with anxieties and worries, their impact on functioning, and attempts to resolve them. Target Date: 2023-12-03 Frequency: Biweekly  Progress: 0 Modality: individual  Related Interventions Ask the client to describe his/her past experiences of anxiety and their impact on functioning; assess the focus, excessiveness, and uncontrollability of the worry and the type, frequency, intensity, and duration of his/her anxiety symptoms (consider using a structured interview such as The Anxiety Disorders Interview Schedule-Adult Version). Objective Learn and implement calming skills to reduce overall anxiety and manage anxiety symptoms. Target Date: 2023-12-03 Frequency: Biweekly  Progress: 0 Modality: individual  Related Interventions Teach the client calming/relaxation skills (e.g., applied relaxation, progressive muscle relaxation, cue controlled relaxation; mindful breathing; biofeedback) and how to discriminate better between relaxation and tension; teach the client how to apply these skills to his/her daily life (e.g., New Directions in Progressive Muscle Relaxation by Casper Harrison, and Hazlett-Stevens; Treating Generalized Anxiety Disorder by Rygh and Amparo Bristol). Assign the  client homework each session in which he/she practices relaxation exercises daily,  gradually applying them progressively from non-anxiety-provoking to anxiety-provoking situations; review and reinforce success while providing corrective feedback toward improvement. Assign the client to read about progressive muscle relaxation and other calming strategies in relevant books or treatment manuals (e.g., Progressive Relaxation Training by Gwynneth Aliment and Dani Gobble; Mastery of Your Anxiety and Worry: Workbook by Beckie Busing). Objective Verbalize an understanding of the role that cognitive biases play in excessive irrational worry and persistent anxiety symptoms. Target Date: 2023-12-03 Frequency: Biweekly  Progress: 0 Modality: individual  Related Interventions Discuss examples demonstrating that unrealistic worry typically overestimates the probability of threats and underestimates or overlooks the client's ability to manage realistic demands (or assign "Past Successful Anxiety Coping" in the Adult Psychotherapy Homework Planner by [facility]). Assist the client in analyzing his/her worries by examining potential biases such as the probability of the negative expectation occurring, the real consequences of it occurring, his/her ability to control the outcome, the worst possible outcome, and his/her ability to accept it (see "Analyze the Probability of a Feared Event" in the Adult Psychotherapy Homework Planner by Bryn Gulling; Cognitive Therapy of Anxiety Disorders by Alison Stalling). Help the client gain insight into the notion that worry may function as a form of avoidance of a feared problem and that it creates acute and chronic tension. Objective Identify, challenge, and replace biased, fearful self-talk with positive, realistic, and empowering self-talk. Target Date: 2023-12-03 Frequency: Biweekly  Progress: 0 Modality: individual  Related Interventions Explore the client's schema and self-talk that mediate his/her fear response; assist him/her in challenging the biases; replace the  distorted messages with reality-based alternatives and positive, realistic self-talk that will increase his/her self-confidence in coping with irrational fears (see Cognitive Therapy of Anxiety Disorders by Alison Stalling). Assign the client a homework exercise in which he/she identifies fearful self-talk, identifies biases in the self-talk, generates alternatives, and tests through behavioral experiments (or assign "Negative Thoughts Trigger Negative Feelings" in the Adult Psychotherapy Homework Planner by [facility]); review and reinforce success, providing corrective feedback toward improvement. Objective Undergo gradual repeated imaginal exposure to the feared negative consequences predicted by worries and develop alternative reality-based predictions. Target Date: 2023-12-03 Frequency: Biweekly  Progress: 0 Modality: individual  Related Interventions Direct and assist the client in constructing a hierarchy of two to three spheres of worry for use in exposure (e.g., worry about harm to others, financial difficulties, relationship problems). Ask the client to vividly imagine worst-case consequences of worries, holding them in mind until anxiety associated with them weakens (up to 30 minutes); generate reality-based alternatives to that worst case and process them (see Mastery of Your Anxiety and Worry: Therapist Guide by Hans Eden). Assign the client a homework exercise in which he/she does worry exposures and records responses (see Mastery of Your Anxiety and Worry: Workbook by Adora Fridge and Eliot Ford or Generalized Anxiety Disorder by Eather Colas, and Eliot Ford); review, reinforce success, and provide corrective feedback toward improvement. Objective Learn and implement relapse prevention strategies for managing possible future anxiety symptoms. Target Date: 2023-12-03 Frequency: Biweekly  Progress: 0 Modality: individual  Related Interventions Discuss with the client the distinction  between a lapse and relapse, associating a lapse with an initial and reversible return of worry, anxiety symptoms, or urges to avoid, and relapse with the decision to continue the fearful and avoidant patterns. Identify and rehearse with the client the management of future situations or circumstances in which lapses could occur. Instruct the  client to routinely use new therapeutic skills (e.g., relaxation, cognitive restructuring, exposure, and problem-solving) in daily life to address emergent worries, anxiety, and avoidant tendencies. Develop a "coping card" on which coping strategies and other important information (e.g., "Breathe deeply and relax," "Challenge unrealistic worries," "Use problem-solving") are written for the client's later use. Schedule periodic "maintenance" sessions to help the client maintain therapeutic gains. Objective Learn to accept limitations in life and commit to tolerating, rather than avoiding, unpleasant emotions while accomplishing meaningful goals. Target Date: 2023-12-03 Frequency: Biweekly  Progress: 0 Modality: individual  Related Interventions Use techniques from Acceptance and Commitment Therapy to help client accept uncomfortable realities such as lack of complete control, imperfections, and uncertainty and tolerate unpleasant emotions and thoughts in order to accomplish value-consistent goals. 4. Stabilize anxiety level while increasing ability to function on a daily basis.  Diagnosis:Generalized anxiety disorder  Plan:  -"tackle [employer]" -meet again on Monday, March 29, 2023 at [place].

## 2023-03-15 ENCOUNTER — Ambulatory Visit: Payer: BC Managed Care – PPO | Admitting: Professional

## 2023-03-29 ENCOUNTER — Ambulatory Visit: Payer: BC Managed Care – PPO | Admitting: Professional

## 2023-03-30 ENCOUNTER — Ambulatory Visit (INDEPENDENT_AMBULATORY_CARE_PROVIDER_SITE_OTHER): Payer: BC Managed Care – PPO | Admitting: Professional

## 2023-03-30 ENCOUNTER — Encounter: Payer: Self-pay | Admitting: Professional

## 2023-03-30 DIAGNOSIS — F411 Generalized anxiety disorder: Secondary | ICD-10-CM | POA: Diagnosis not present

## 2023-03-30 NOTE — Progress Notes (Signed)
[facility] Counselor/Therapist Progress Note  Patient ID: TRICA MCELREATH, MRN: [id number],    Date: 03/30/2023  Time Spent: 41 minutes 901-943am 2Treatment Type: Individual Therapy  Risk Assessment: Danger to Self:  No Self-injurious Behavior: No Danger to Others: No  Subjective: This session was held via video teletherapy. The patient consented to video teletherapy and was located in her car during this session. She is aware it is the responsibility of the patient to secure confidentiality on her end of the session. The provider was in a private home office for the duration of this session.    The patient arrived on time for her webex appointment.  Issues: 1-homework- completed -"tackle [employer]" 2-anxiety -she feels much better -in/out stores with husband -has been driving a lot more than usual -spring break with Briscoe Burns -pt wants to continue using exposure therapy -she found it very empowering   -it really gave her the confidence boost -she is going to plan at least one time per week with neighbor -pt reports her daily anxiety is more about dealing with her feelings as they come   -she reminds herself that she has felt it more often and that nothing will happen 3-reviewed measurable objectives -pt has made progress on all objectives 4-calming skills -discussed preemptively preparing for potential roadblocks  December 2023 Treatment Plan Problems Addressed  Anxiety Goals 1. Enhance ability to effectively cope with the full variety of life's worries and anxieties. 2. Learn and implement coping skills that result in a reduction of anxiety and worry, and improved daily functioning. 3. Reduce overall frequency, intensity, and duration of the anxiety so that daily functioning is not impaired. Objective Describe situations, thoughts, feelings, and actions associated with anxieties and worries, their impact on functioning, and attempts to resolve them. Target Date:  2023-12-03 Frequency: Biweekly  Progress: 80 Modality: individual  Related Interventions Ask the client to describe his/her past experiences of anxiety and their impact on functioning; assess the focus, excessiveness, and uncontrollability of the worry and the type, frequency, intensity, and duration of his/her anxiety symptoms (consider using a structured interview such as The Anxiety Disorders Interview Schedule-Adult Version). Objective Learn and implement calming skills to reduce overall anxiety and manage anxiety symptoms. Target Date: 2023-12-03 Frequency: Biweekly  Progress: 40 Modality: individual  Related Interventions Teach the client calming/relaxation skills (e.g., applied relaxation, progressive muscle relaxation, cue controlled relaxation; mindful breathing; biofeedback) and how to discriminate better between relaxation and tension; teach the client how to apply these skills to his/her daily life (e.g., New Directions in Progressive Muscle Relaxation by Marcelyn Ditty, and Hazlett-Stevens; Treating Generalized Anxiety Disorder by Rygh and Ida Rogue). Assign the client homework each session in which he/she practices relaxation exercises daily, gradually applying them progressively from non-anxiety-provoking to anxiety-provoking situations; review and reinforce success while providing corrective feedback toward improvement. Assign the client to read about progressive muscle relaxation and other calming strategies in relevant books or treatment manuals (e.g., Progressive Relaxation Training by Robb Matar and Alen Blew; Mastery of Your Anxiety and Worry: Workbook by Earlie Counts). Objective Verbalize an understanding of the role that cognitive biases play in excessive irrational worry and persistent anxiety symptoms. Target Date: 2023-12-03 Frequency: Biweekly  Progress: 100 Modality: individual  Related Interventions Discuss examples demonstrating that unrealistic worry typically  overestimates the probability of threats and underestimates or overlooks the client's ability to manage realistic demands (or assign "Past Successful Anxiety Coping" in the Adult Psychotherapy Homework Planner by [facility]). Assist the client in analyzing his/her  worries by examining potential biases such as the probability of the negative expectation occurring, the real consequences of it occurring, his/her ability to control the outcome, the worst possible outcome, and his/her ability to accept it (see "Analyze the Probability of a Feared Event" in the Adult Psychotherapy Homework Planner by Stephannie Li; Cognitive Therapy of Anxiety Disorders by Laurence Slate). Help the client gain insight into the notion that worry may function as a form of avoidance of a feared problem and that it creates acute and chronic tension. Objective Identify, challenge, and replace biased, fearful self-talk with positive, realistic, and empowering self-talk. Target Date: 2023-12-03 Frequency: Biweekly  Progress: 70 Modality: individual  Related Interventions Explore the client's schema and self-talk that mediate his/her fear response; assist him/her in challenging the biases; replace the distorted messages with reality-based alternatives and positive, realistic self-talk that will increase his/her self-confidence in coping with irrational fears (see Cognitive Therapy of Anxiety Disorders by Laurence Slate). Assign the client a homework exercise in which he/she identifies fearful self-talk, identifies biases in the self-talk, generates alternatives, and tests through behavioral experiments (or assign "Negative Thoughts Trigger Negative Feelings" in the Adult Psychotherapy Homework Planner by [facility]); review and reinforce success, providing corrective feedback toward improvement. Objective Undergo gradual repeated imaginal exposure to the feared negative consequences predicted by worries and develop alternative reality-based  predictions. Target Date: 2023-12-03 Frequency: Biweekly  Progress: 60 Modality: individual  Related Interventions Direct and assist the client in constructing a hierarchy of two to three spheres of worry for use in exposure (e.g., worry about harm to others, financial difficulties, relationship problems). Ask the client to vividly imagine worst-case consequences of worries, holding them in mind until anxiety associated with them weakens (up to 30 minutes); generate reality-based alternatives to that worst case and process them (see Mastery of Your Anxiety and Worry: Therapist Guide by Rosiland Oz). Assign the client a homework exercise in which he/she does worry exposures and records responses (see Mastery of Your Anxiety and Worry: Workbook by Elenora Fender and Filbert Schilder or Generalized Anxiety Disorder by Elesa Hacker, and Filbert Schilder); review, reinforce success, and provide corrective feedback toward improvement. Objective Learn and implement relapse prevention strategies for managing possible future anxiety symptoms. Target Date: 2023-12-03 Frequency: Biweekly  Progress: 60 Modality: individual  Related Interventions Discuss with the client the distinction between a lapse and relapse, associating a lapse with an initial and reversible return of worry, anxiety symptoms, or urges to avoid, and relapse with the decision to continue the fearful and avoidant patterns. Identify and rehearse with the client the management of future situations or circumstances in which lapses could occur. Instruct the client to routinely use new therapeutic skills (e.g., relaxation, cognitive restructuring, exposure, and problem-solving) in daily life to address emergent worries, anxiety, and avoidant tendencies. Develop a "coping card" on which coping strategies and other important information (e.g., "Breathe deeply and relax," "Challenge unrealistic worries," "Use problem-solving") are written for the client's later  use. Schedule periodic "maintenance" sessions to help the client maintain therapeutic gains. Objective Learn to accept limitations in life and commit to tolerating, rather than avoiding, unpleasant emotions while accomplishing meaningful goals. Target Date: 2023-12-03 Frequency: Biweekly  Progress: 70 Modality: individual  Related Interventions Use techniques from Acceptance and Commitment Therapy to help client accept uncomfortable realities such as lack of complete control, imperfections, and uncertainty and tolerate unpleasant emotions and thoughts in order to accomplish value-consistent goals. 4. Stabilize anxiety level while increasing ability to function on a daily  basis.  Diagnosis:Generalized anxiety disorder  Plan:  -increase social opportunities -meet again on Monday, March 29, 2023 at 9am.

## 2023-04-07 ENCOUNTER — Encounter: Payer: Self-pay | Admitting: Family Medicine

## 2023-04-07 ENCOUNTER — Ambulatory Visit: Payer: BC Managed Care – PPO | Admitting: Family Medicine

## 2023-04-07 DIAGNOSIS — J309 Allergic rhinitis, unspecified: Secondary | ICD-10-CM

## 2023-04-12 ENCOUNTER — Encounter: Payer: Self-pay | Admitting: Professional

## 2023-04-12 ENCOUNTER — Ambulatory Visit (INDEPENDENT_AMBULATORY_CARE_PROVIDER_SITE_OTHER): Payer: BC Managed Care – PPO | Admitting: Professional

## 2023-04-12 DIAGNOSIS — F411 Generalized anxiety disorder: Secondary | ICD-10-CM

## 2023-04-12 NOTE — Progress Notes (Signed)
[facility] Counselor/Therapist Progress Note  Patient Lindsey Jensen, MRN: [id number],    Date: 04/12/2023  Time Spent: 50 minutes 9-950am Treatment Type: Individual Therapy  Risk Assessment: Danger to Self:  No Self-injurious Behavior: No Danger to Others: No  Subjective: This session was held via video teletherapy. The patient consented to video teletherapy and was located in her car during this session. She is aware it is the responsibility of the patient to secure confidentiality on her end of the session. The provider was in a private home office for the duration of this session.    The patient arrived on time for her Caregility appointment.  Issues: 1-anxiety a-pt has had no major shifts and has been maintaining b-she has extended the length of time in public places c-she continues to experience some anxiety but admits to it and presses onward d-importance and maintenance as growth -reviewed with pt that previous episode of treatment she did not continue proactive behaviors to manage anxiety and resulted in relapse -reviewed prevention vs intervention 2-family a-pt verbalized ongoing concerns with mother and her disappointment that her mother does not appear interested in her life b-pt upset that her sisters are the center of attention c-her mother chronically complains and talks about other people negatively d-she is now focused on her son-in-law using his nine remaining weeks of maternity leave -her daughter will be home with him and the baby -they will be spending time at his parents home and not hers -she has made comments that he is not competent as a parent e-how to manage feelings and have productive conversations -with mother don't try and reason with her as she doesn't sound reasonable -with sisters actively engage in a relationship with them -set boundaries with family about not referring to her as the baby but as the youngest -when mother  confronts on something that occurred between her and her sisters politely set your boundary   -conversation is between them and she will not discuss  December 2023 Treatment Plan Problems Addressed  Anxiety Goals 1. Enhance ability to effectively cope with the full variety of life's worries and anxieties. 2. Learn and implement coping skills that result in a reduction of anxiety and worry, and improved daily functioning. 3. Reduce overall frequency, intensity, and duration of the anxiety so that daily functioning is not impaired. Objective Describe situations, thoughts, feelings, and actions associated with anxieties and worries, their impact on functioning, and attempts to resolve them. Target Date: 2023-12-03 Frequency: Biweekly  Progress: 80 Modality: individual  Related Interventions Ask the client to describe his/her past experiences of anxiety and their impact on functioning; assess the focus, excessiveness, and uncontrollability of the worry and the type, frequency, intensity, and duration of his/her anxiety symptoms (consider using a structured interview such as The Anxiety Disorders Interview Schedule-Adult Version). Objective Learn and implement calming skills to reduce overall anxiety and manage anxiety symptoms. Target Date: 2023-12-03 Frequency: Biweekly  Progress: 40 Modality: individual  Related Interventions Teach the client calming/relaxation skills (e.g., applied relaxation, progressive muscle relaxation, cue controlled relaxation; mindful breathing; biofeedback) and how to discriminate better between relaxation and tension; teach the client how to apply these skills to his/her daily life (e.g., New Directions in Progressive Muscle Relaxation by Marcelyn Ditty, and Hazlett-Stevens; Treating Generalized Anxiety Disorder by Rygh and Ida Rogue). Assign the client homework each session in which he/she practices relaxation exercises daily, gradually applying them progressively  from non-anxiety-provoking to anxiety-provoking situations; review and reinforce success while providing corrective  feedback toward improvement. Assign the client to read about progressive muscle relaxation and other calming strategies in relevant books or treatment manuals (e.g., Progressive Relaxation Training by Robb Matar and Alen Blew; Mastery of Your Anxiety and Worry: Workbook by Earlie Counts). Objective Verbalize an understanding of the role that cognitive biases play in excessive irrational worry and persistent anxiety symptoms. Target Date: 2023-12-03 Frequency: Biweekly  Progress: 100 Modality: individual  Related Interventions Discuss examples demonstrating that unrealistic worry typically overestimates the probability of threats and underestimates or overlooks the client's ability to manage realistic demands (or assign "Past Successful Anxiety Coping" in the Adult Psychotherapy Homework Planner by [facility]). Assist the client in analyzing his/her worries by examining potential biases such as the probability of the negative expectation occurring, the real consequences of it occurring, his/her ability to control the outcome, the worst possible outcome, and his/her ability to accept it (see "Analyze the Probability of a Feared Event" in the Adult Psychotherapy Homework Planner by Stephannie Li; Cognitive Therapy of Anxiety Disorders by Laurence Slate). Help the client gain insight into the notion that worry may function as a form of avoidance of a feared problem and that it creates acute and chronic tension. Objective Identify, challenge, and replace biased, fearful self-talk with positive, realistic, and empowering self-talk. Target Date: 2023-12-03 Frequency: Biweekly  Progress: 70 Modality: individual  Related Interventions Explore the client's schema and self-talk that mediate his/her fear response; assist him/her in challenging the biases; replace the distorted messages with reality-based  alternatives and positive, realistic self-talk that will increase his/her self-confidence in coping with irrational fears (see Cognitive Therapy of Anxiety Disorders by Laurence Slate). Assign the client a homework exercise in which he/she identifies fearful self-talk, identifies biases in the self-talk, generates alternatives, and tests through behavioral experiments (or assign "Negative Thoughts Trigger Negative Feelings" in the Adult Psychotherapy Homework Planner by [facility]); review and reinforce success, providing corrective feedback toward improvement. Objective Undergo gradual repeated imaginal exposure to the feared negative consequences predicted by worries and develop alternative reality-based predictions. Target Date: 2023-12-03 Frequency: Biweekly  Progress: 60 Modality: individual  Related Interventions Direct and assist the client in constructing a hierarchy of two to three spheres of worry for use in exposure (e.g., worry about harm to others, financial difficulties, relationship problems). Ask the client to vividly imagine worst-case consequences of worries, holding them in mind until anxiety associated with them weakens (up to 30 minutes); generate reality-based alternatives to that worst case and process them (see Mastery of Your Anxiety and Worry: Therapist Guide by Rosiland Oz). Assign the client a homework exercise in which he/she does worry exposures and records responses (see Mastery of Your Anxiety and Worry: Workbook by Elenora Fender and Filbert Schilder or Generalized Anxiety Disorder by Elesa Hacker, and Filbert Schilder); review, reinforce success, and provide corrective feedback toward improvement. Objective Learn and implement relapse prevention strategies for managing possible future anxiety symptoms. Target Date: 2023-12-03 Frequency: Biweekly  Progress: 60 Modality: individual  Related Interventions Discuss with the client the distinction between a lapse and relapse,  associating a lapse with an initial and reversible return of worry, anxiety symptoms, or urges to avoid, and relapse with the decision to continue the fearful and avoidant patterns. Identify and rehearse with the client the management of future situations or circumstances in which lapses could occur. Instruct the client to routinely use new therapeutic skills (e.g., relaxation, cognitive restructuring, exposure, and problem-solving) in daily life to address emergent worries, anxiety, and avoidant tendencies. Develop a "coping  card" on which coping strategies and other important information (e.g., "Breathe deeply and relax," "Challenge unrealistic worries," "Use problem-solving") are written for the client's later use. Schedule periodic "maintenance" sessions to help the client maintain therapeutic gains. Objective Learn to accept limitations in life and commit to tolerating, rather than avoiding, unpleasant emotions while accomplishing meaningful goals. Target Date: 2023-12-03 Frequency: Biweekly  Progress: 70 Modality: individual  Related Interventions Use techniques from Acceptance and Commitment Therapy to help client accept uncomfortable realities such as lack of complete control, imperfections, and uncertainty and tolerate unpleasant emotions and thoughts in order to accomplish value-consistent goals. 4. Stabilize anxiety level while increasing ability to function on a daily basis.  Diagnosis:Generalized anxiety disorder  Plan:  -meet again on Thursday, May 06, 2023 at 10am.

## 2023-04-13 ENCOUNTER — Other Ambulatory Visit: Payer: Self-pay

## 2023-04-13 ENCOUNTER — Other Ambulatory Visit: Payer: BC Managed Care – PPO

## 2023-04-13 DIAGNOSIS — D751 Secondary polycythemia: Secondary | ICD-10-CM

## 2023-04-14 ENCOUNTER — Inpatient Hospital Stay: Payer: BC Managed Care – PPO | Attending: Hematology

## 2023-04-14 DIAGNOSIS — D751 Secondary polycythemia: Secondary | ICD-10-CM | POA: Insufficient documentation

## 2023-04-14 LAB — CBC
HCT: 53.9 % — ABNORMAL HIGH (ref 36.0–46.0)
Hemoglobin: 17.6 g/dL — ABNORMAL HIGH (ref 12.0–15.0)
MCH: 31.5 pg (ref 26.0–34.0)
MCHC: 32.7 g/dL (ref 30.0–36.0)
MCV: 96.4 fL (ref 80.0–100.0)
Platelets: 169 10*3/uL (ref 150–400)
RBC: 5.59 MIL/uL — ABNORMAL HIGH (ref 3.87–5.11)
RDW: 14.6 % (ref 11.5–15.5)
WBC: 8.5 10*3/uL (ref 4.0–10.5)
nRBC: 0 % (ref 0.0–0.2)

## 2023-04-20 ENCOUNTER — Inpatient Hospital Stay: Payer: BC Managed Care – PPO | Admitting: Hematology

## 2023-04-28 ENCOUNTER — Ambulatory Visit (INDEPENDENT_AMBULATORY_CARE_PROVIDER_SITE_OTHER): Payer: BC Managed Care – PPO | Admitting: Allergy & Immunology

## 2023-04-28 DIAGNOSIS — T782XXD Anaphylactic shock, unspecified, subsequent encounter: Secondary | ICD-10-CM

## 2023-04-28 DIAGNOSIS — T7800XD Anaphylactic reaction due to unspecified food, subsequent encounter: Secondary | ICD-10-CM | POA: Diagnosis not present

## 2023-04-28 DIAGNOSIS — J4520 Mild intermittent asthma, uncomplicated: Secondary | ICD-10-CM

## 2023-04-28 DIAGNOSIS — J3089 Other allergic rhinitis: Secondary | ICD-10-CM | POA: Diagnosis not present

## 2023-04-28 DIAGNOSIS — J302 Other seasonal allergic rhinitis: Secondary | ICD-10-CM

## 2023-04-28 DIAGNOSIS — Z720 Tobacco use: Secondary | ICD-10-CM

## 2023-04-28 DIAGNOSIS — T7800XA Anaphylactic reaction due to unspecified food, initial encounter: Secondary | ICD-10-CM

## 2023-04-28 MED ORDER — AZELASTINE HCL 0.1 % NA SOLN: 2.0000 | Freq: Two times a day (BID) | NASAL | 2 refills | Status: DC

## 2023-04-28 MED ORDER — FLUTICASONE PROPIONATE 50 MCG/ACT NA SUSP: NASAL | 2 refills | Status: DC

## 2023-04-28 NOTE — Progress Notes (Signed)
RE: Lindsey Jensen MRN: [id number] DOB: 08/01/1986 Date of Telemedicine Visit: 04/28/2023  Referring provider: Anabel Halon, MD Primary care provider: Anabel Halon, MD  Chief Complaint: No chief complaint on file.   Telemedicine Follow Up Visit via Telephone: I connected with Lindsey Jensen for a follow up on 04/28/23 by telephone and verified that I am speaking with the correct person using two identifiers.   I discussed the limitations, risks, security and privacy concerns of performing an evaluation and management service by telephone and the availability of in person appointments. I also discussed with the patient that there may be a patient responsible charge related to this service. The patient expressed understanding and agreed to proceed.  Patient is at home.  Provider is at the office.  Visit start time: 4:30 PM Visit end time: 4:55 PM Insurance consent/check in by: Dr. Reece Agar Medical consent and medical assistant/nurse: ***  History of Present Illness:  She is a 37 y.o. female, who is being followed for ***. Her previous allergy office visit was in *** with {Blank single:19197::"Dr. Kim","Dr. Kozlow","Dr. Bardelas","Dr. Dennis","Dr. Lomasney","Dr. Padgett","Anne Ambs, FNP","Christine Amada Jupiter, FNP","myself"}.  She was last seen in November 2023 by Dr. Allena Katz.  At that time, she was continued on Xopenex as needed.  For her allergic rhinitis, she was doing well on Allegra as well as Flonase and Astelin.  She continue to avoid red meat as well as shellfish, peanuts, and tree nuts.  EpiPen was refilled.  Since visit,  Asthma/Respiratory Symptom History: Breathing has been under good control. She has a refilled of her Xopenex. Prior to that, she had not used it in 6 months. She literally picked up the Xopenex around two months.  Fall and spring results in a constant dry cough. The inhaler helps break this stuff in the back of her throat. Xopenex is $40 out of pocket. She did not like the  tachycardia from the albuterol. She uses one inhaler per year.   She continues to be a smoker - around 3/4 pack per day.   Allergic Rhinitis Symptom History: Allergies are fairly good aside from the past 5 weeks. She continues to take Allegra once daily. When the pollen started, she started the Flonase and the Astelin. She has bene diligent with this. She had to start using her allergy eye drops. But she can breathe. She does feel better with the breathing part. She has not had any sinus infections aside from one in the fall.  She will sometimes try a different antihistamine. She ran out of Allegra six months and she  ended up starting Claritin because her husband brought that home from the store. She did not like the sensation of Claritin.  Food Allergy Symptom History: She continues to avoid shellfish. They are not a big fan of it right now. She eats milk.  She is not worried about beef and pork because she does not eat it anyway. Sometimes, she will have some gelatin or glycerin. She does not have the desire to eat steak or pork chops.   {Blank single:19197::"Eczema Symptom Symptom History: ***"," "}  Her son Briscoe Burns just had preschool graduation. The 35yo sang a "bunch of Jesus songs". Briscoe Burns is going to [employer].   She is going on 12 years at the [employer]  Otherwise, there have been no changes to her past medical history, surgical history, family history, or social history.  Assessment and Plan:  Navi is a 37 y.o. 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  Diagnostics: None.  Medication List:  Current Outpatient Medications  Medication Sig Dispense Refill  . azelastine (ASTELIN) 0.1 % nasal spray Place 2 sprays into both nostrils 2 (two) times daily. 90 mL 1  . busPIRone (BUSPAR) 5 MG tablet Take 1 tablet (5 mg total) by mouth 3 (three) times daily. 90 tablet 0  . clindamycin (CLINDAGEL) 1 % gel APPLY TO AFFECTED AREA TWICE A DAY 30 g 1  . EPINEPHrine (AUVI-Q) 0.3 mg/0.3 mL IJ SOAJ injection Inject 0.3 mg into the muscle as needed for anaphylaxis. 1 each 1  . fexofenadine (ALLEGRA) 180 MG tablet Take 1 tablet (180 mg total) by mouth daily. 90 tablet 1  . fluticasone (FLONASE) 50 MCG/ACT nasal spray SPRAY 2 SPRAYS INTO EACH NOSTRIL EVERY DAY 48 mL 1  . levalbuterol (XOPENEX HFA) 45 MCG/ACT inhaler TAKE 2 PUFFS BY MOUTH EVERY 6 HOURS AS NEEDED FOR WHEEZE OR SHORTNESS OF BREATH 15 g 1   No current facility-administered medications for this visit.   Allergies: Allergies  Allergen Reactions  . Beef-Derived Products Anaphylaxis    Alpha-Gal Allergy   . Dairy Aid [Tilactase] Other (See Comments)    SOB  . Meat [Alpha-Gal] Palpitations    All mammal. Symptom asthma .  Marland Kitchen Pork-Derived Products Anaphylaxis    Alpha-Gal Allergy  . Shellfish Allergy Other (See Comments)    SOB  . Other Rash and Itching   I reviewed her past medical history, social history, family history, and environmental history and no significant changes have been reported from previous visits.  Review of Systems  Objective:  Physical exam not obtained as encounter was done via telephone.   Previous notes and tests were reviewed.  I discussed the assessment and treatment plan with the patient. The patient was provided an opportunity to ask questions and all were answered. The patient agreed with the plan  and demonstrated an understanding of the instructions.   The patient was advised to call back or seek an in-person evaluation if the symptoms worsen or if the condition fails to improve as anticipated.  I provided *** minutes of non-face-to-face time during this encounter.  It was my pleasure to participate in [place] Maske's care today. Please feel free to contact me with any questions or concerns.   Sincerely,  Alfonse Spruce, MD

## 2023-04-29 ENCOUNTER — Encounter: Payer: Self-pay | Admitting: Allergy & Immunology

## 2023-04-29 NOTE — Patient Instructions (Addendum)
Asthma -Continue Xopenex 2 puffs once every 4 hours as needed for cough or wheeze -You may use Xopenex 2 puffs 5-15 minutes before activity to decrease cough or wheeze  Allergic rhinitis -Continue allergen avoidance measures directed toward dust mites, cat, dog, cockroach, and tree pollen as listed below -Continue Allegra 180 mg once a day as needed for a runny nose or itch.   -Continue fluticasone 2 sprays in each nostril once a day.  Aim upward and outward -Continue azelastine 2 sprays in each nostril twice a day as needed for nasal symptoms Consider saline nasal rinses as needed for nasal symptoms. Use this before any medicated nasal sprays for best result   Food allergy -Continue to avoid red meats, shellfish, peanuts, and tree nuts.   -Continue with keeping dairy in the diet since this seems to be tolerated well.  - In case of an allergic reaction, take Benadryl 50 mg every 4 hours, and if life-threatening symptoms occur, inject with EpiPen 0.3 mg. -If you are interested in retesting, we can retest at future visits.  Follow up in one year or earlier if needed.

## 2023-05-04 ENCOUNTER — Inpatient Hospital Stay: Payer: BC Managed Care – PPO | Admitting: Hematology

## 2023-05-05 ENCOUNTER — Ambulatory Visit: Payer: BC Managed Care – PPO | Admitting: Allergy & Immunology

## 2023-05-06 ENCOUNTER — Encounter: Payer: Self-pay | Admitting: Professional

## 2023-05-06 ENCOUNTER — Encounter: Payer: BC Managed Care – PPO | Admitting: Internal Medicine

## 2023-05-06 ENCOUNTER — Ambulatory Visit (INDEPENDENT_AMBULATORY_CARE_PROVIDER_SITE_OTHER): Payer: BC Managed Care – PPO | Admitting: Professional

## 2023-05-06 DIAGNOSIS — F411 Generalized anxiety disorder: Secondary | ICD-10-CM | POA: Diagnosis not present

## 2023-05-06 NOTE — Progress Notes (Signed)
[facility] Counselor/Therapist Progress Note  Patient ID: GERARDA BUHMAN, MRN: [id number],    Date: 05/06/2023  Time Spent: 44 minutes 1005-1049am  Treatment Type: Individual Therapy  Risk Assessment: Danger to Self:  No Self-injurious Behavior: No Danger to Others: No  Subjective: This session was held via video teletherapy. The patient consented to video teletherapy and was located in her car during this session. She is aware it is the responsibility of the patient to secure confidentiality on her end of the session. The provider was in a private home office for the duration of this session.    The patient arrived late for her Caregility appointment.  Issues: 1-anxiety a-pt has had increased anxiety this week -pt feels uncertain about why she has had an increase -since last Friday has been "super on-edge" -she cannot identify a trigger -she has been going to bed earlier, increased water intake, and drinking camomile tea -retraining her mind to not engage catastrophic thinking -pt recalls having made decisions on logic vs feelings in the past  December 2023 Treatment Plan Problems Addressed  Anxiety Goals 1. Enhance ability to effectively cope with the full variety of life's worries and anxieties. 2. Learn and implement coping skills that result in a reduction of anxiety and worry, and improved daily functioning. 3. Reduce overall frequency, intensity, and duration of the anxiety so that daily functioning is not impaired. Objective Describe situations, thoughts, feelings, and actions associated with anxieties and worries, their impact on functioning, and attempts to resolve them. Target Date: 2023-12-03 Frequency: Biweekly  Progress: 80 Modality: individual  Related Interventions Ask the client to describe his/her past experiences of anxiety and their impact on functioning; assess the focus, excessiveness, and uncontrollability of the worry and the type,  frequency, intensity, and duration of his/her anxiety symptoms (consider using a structured interview such as The Anxiety Disorders Interview Schedule-Adult Version). Objective Learn and implement calming skills to reduce overall anxiety and manage anxiety symptoms. Target Date: 2023-12-03 Frequency: Biweekly  Progress: 40 Modality: individual  Related Interventions Teach the client calming/relaxation skills (e.g., applied relaxation, progressive muscle relaxation, cue controlled relaxation; mindful breathing; biofeedback) and how to discriminate better between relaxation and tension; teach the client how to apply these skills to his/her daily life (e.g., New Directions in Progressive Muscle Relaxation by Marcelyn Ditty, and Hazlett-Stevens; Treating Generalized Anxiety Disorder by Rygh and Ida Rogue). Assign the client homework each session in which he/she practices relaxation exercises daily, gradually applying them progressively from non-anxiety-provoking to anxiety-provoking situations; review and reinforce success while providing corrective feedback toward improvement. Assign the client to read about progressive muscle relaxation and other calming strategies in relevant books or treatment manuals (e.g., Progressive Relaxation Training by Robb Matar and Alen Blew; Mastery of Your Anxiety and Worry: Workbook by Earlie Counts). Objective Verbalize an understanding of the role that cognitive biases play in excessive irrational worry and persistent anxiety symptoms. Target Date: 2023-12-03 Frequency: Biweekly  Progress: 100 Modality: individual  Related Interventions Discuss examples demonstrating that unrealistic worry typically overestimates the probability of threats and underestimates or overlooks the client's ability to manage realistic demands (or assign "Past Successful Anxiety Coping" in the Adult Psychotherapy Homework Planner by [facility]). Assist the client in analyzing his/her worries  by examining potential biases such as the probability of the negative expectation occurring, the real consequences of it occurring, his/her ability to control the outcome, the worst possible outcome, and his/her ability to accept it (see "Analyze the Probability of a Feared Event" in the  Adult Psychotherapy Administrator, arts by Stephannie Li; Cognitive Therapy of Anxiety Disorders by Laurence Slate). Help the client gain insight into the notion that worry may function as a form of avoidance of a feared problem and that it creates acute and chronic tension. Objective Identify, challenge, and replace biased, fearful self-talk with positive, realistic, and empowering self-talk. Target Date: 2023-12-03 Frequency: Biweekly  Progress: 70 Modality: individual  Related Interventions Explore the client's schema and self-talk that mediate his/her fear response; assist him/her in challenging the biases; replace the distorted messages with reality-based alternatives and positive, realistic self-talk that will increase his/her self-confidence in coping with irrational fears (see Cognitive Therapy of Anxiety Disorders by Laurence Slate). Assign the client a homework exercise in which he/she identifies fearful self-talk, identifies biases in the self-talk, generates alternatives, and tests through behavioral experiments (or assign "Negative Thoughts Trigger Negative Feelings" in the Adult Psychotherapy Homework Planner by [facility]); review and reinforce success, providing corrective feedback toward improvement. Objective Undergo gradual repeated imaginal exposure to the feared negative consequences predicted by worries and develop alternative reality-based predictions. Target Date: 2023-12-03 Frequency: Biweekly  Progress: 60 Modality: individual  Related Interventions Direct and assist the client in constructing a hierarchy of two to three spheres of worry for use in exposure (e.g., worry about harm to others, financial  difficulties, relationship problems). Ask the client to vividly imagine worst-case consequences of worries, holding them in mind until anxiety associated with them weakens (up to 30 minutes); generate reality-based alternatives to that worst case and process them (see Mastery of Your Anxiety and Worry: Therapist Guide by Rosiland Oz). Assign the client a homework exercise in which he/she does worry exposures and records responses (see Mastery of Your Anxiety and Worry: Workbook by Elenora Fender and Filbert Schilder or Generalized Anxiety Disorder by Elesa Hacker, and Filbert Schilder); review, reinforce success, and provide corrective feedback toward improvement. Objective Learn and implement relapse prevention strategies for managing possible future anxiety symptoms. Target Date: 2023-12-03 Frequency: Biweekly  Progress: 60 Modality: individual  Related Interventions Discuss with the client the distinction between a lapse and relapse, associating a lapse with an initial and reversible return of worry, anxiety symptoms, or urges to avoid, and relapse with the decision to continue the fearful and avoidant patterns. Identify and rehearse with the client the management of future situations or circumstances in which lapses could occur. Instruct the client to routinely use new therapeutic skills (e.g., relaxation, cognitive restructuring, exposure, and problem-solving) in daily life to address emergent worries, anxiety, and avoidant tendencies. Develop a "coping card" on which coping strategies and other important information (e.g., "Breathe deeply and relax," "Challenge unrealistic worries," "Use problem-solving") are written for the client's later use. Schedule periodic "maintenance" sessions to help the client maintain therapeutic gains. Objective Learn to accept limitations in life and commit to tolerating, rather than avoiding, unpleasant emotions while accomplishing meaningful goals. Target Date: 2023-12-03  Frequency: Biweekly  Progress: 70 Modality: individual  Related Interventions Use techniques from Acceptance and Commitment Therapy to help client accept uncomfortable realities such as lack of complete control, imperfections, and uncertainty and tolerate unpleasant emotions and thoughts in order to accomplish value-consistent goals. 4. Stabilize anxiety level while increasing ability to function on a daily basis.  Diagnosis:Generalized anxiety disorder  Plan:  -meet again on Tuesday, June 01, 2023 at 11am.

## 2023-05-12 NOTE — Progress Notes (Unsigned)
[facility] [address], [place] [address]   CLINIC:  Medical Oncology/Hematology  PCP:  Anabel Halon, MD [address] [place] [address] [phone]   REASON FOR VISIT:  Follow-up for erythrocytosis  CURRENT THERAPY: Intermittent phlebotomy  INTERVAL HISTORY:   [name] 37 y.o. female returns for routine follow-up of erythrocytosis.  She was last seen by Dr. Ellin Saba on 10/19/2022.  She underwent therapeutic phlebotomy on 11/10/2022 and 02/19/2023.  At today's visit, she reports feeling fair.  No recent hospitalizations, surgeries, or changes in baseline health status.  She continues to smoke about 0.75 PPD cigarettes daily.  She does not take any diuretics or hormone supplements.  She has never been diagnosed with sleep apnea, but reports to mild snoring, not feeling rested in the mornings, and feeling sleepy throughout the day.  She denies any carbon monoxide exposure or underlying cardiopulmonary disease.  She does not have any other cardiac risk factors such as hypertension, hyperlipidemia, or diabetes.  No family history of MPN.  She has intermittent erythromelalgia (hands turn red with burning pain), which is improved after phlebotomy.  She reports rare tinnitus and occasional blurry vision from "looking at a computer screen too long."  She denies any dizziness, strokelike symptoms, neuropathy, B symptoms, abdominal pain, nausea, early satiety.  She has 70% energy and 100% appetite. She endorses that she is maintaining a stable weight.  ASSESSMENT & PLAN:  1.  Erythrocytosis - Intermittently elevated hemoglobin since 2014, with persistent elevations since October 2019 - Hematology workup revealed NEGATIVE mutational workup for JAK2, CALR, and MPL.  She had elevated carboxyhemoglobin at 4.2.  Erythropoietin was 12.7 (normal). - Echo on 03/05/2020 shows EF 60 to 65% with normal function.  Atria were normal. - Ultrasound of the abdomen on  04/05/2020 did not show any cystic lesions in the liver or kidneys.  Spleen size was normal. - Current everyday smoker, 0.75 PPD cigarettes - No diuretics, hormone supplements, cardiopulmonary disease, or carbon monoxide exposure.  No family history of MPN. - No history of sleep apnea, but reports to mild snoring, not feeling rested in the mornings, and feeling sleepy throughout the day - Symptomatic with intermittent erythromelalgia, rare tinnitus and occasional blurry vision.  No B symptoms or symptoms of splenomegaly. - No history of DVT or PE. - She has been receiving therapeutic phlebotomy every 2 months (most recently on 02/19/2023) for symptom relief.   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  Unable to rule out bone marrow disorder at this time, as patient has declined bone marrow biopsy in the past. - Discussed with patient that primary treatment of secondary throat cytosis is aimed at causative factor, in this case smoking cessation. - Would recommend therapeutic phlebotomy to maintain HCT <54.0 or for treatment of vasomotor symptoms. - Most recent labs (04/14/2023): Hgb 17.6/hematocrit 53.9% - PLAN: Continue CBC + phlebotomy every 8 weeks.  RTC in 6 months. - No indication for aspirin. - Patient agrees to quit smoking x 3 months to see if her blood counts improve after smoking cessation. - If she continues to have elevated hemoglobin 3 to 4 months after smoking cessation, would recommend referring for sleep study. - If sleep study negative, would strongly recommend bone marrow biopsy at that point in time.  2.  Iron deficiency - No rectal bleeding or melena.  Periods are "normal." - She has been receiving periodic phlebotomy for her erythrocytosis. - Ferritin was 10 (  10/05/2022) with 10% iron saturation/TIBC 549 (11/08/2020) - Etiology of iron deficiency is most likely regular blood loss (therapeutic phlebotomy, menstrual bleeding) and increased  iron demand (overproduction of RBCs, which is seen in both primary and secondary polycythemia). - PLAN: Administering iron to patient with secondary erythrocytosis who also has iron deficiency due to increased demand for RBC production does not inherently make erythrocytosis worse, but careful monitoring is key.  Treatment of iron deficiency in patients with secondary erythrocytosis seems to replenish iron stores safely, with transferrin saturation less than 20% below treatment threshold.   3.  Tobacco use: - She is continuing to smoke 0.75 PPD cigarettes per day. - We discussed smoking cessation extensively (05/13/2023), and she reports that she is going to pick a quit date and quit for at least 3 months so that we can see if her blood counts improved.  PLAN SUMMARY: >> CBC + Phlebotomy every 8 weeks >> Labs in 6 months = CBC/D, CMP, LDH, ferritin, iron/TIBC, erythropoietin, carbon monoxide, carboxyhemoglobin >> OFFICE visit in 6 months (2 weeks after labs)     REVIEW OF SYSTEMS:   Review of Systems  Constitutional:  Negative for appetite change, chills, diaphoresis, fatigue, fever and unexpected weight change.  HENT:   Negative for lump/mass and nosebleeds.   Eyes:  Negative for eye problems.  Respiratory:  Negative for cough, hemoptysis and shortness of breath.   Cardiovascular:  Negative for chest pain, leg swelling and palpitations.  Gastrointestinal:  Negative for abdominal pain, blood in stool, constipation, diarrhea, nausea and vomiting.  Genitourinary:  Negative for hematuria.   Skin: Negative.   Neurological:  Negative for dizziness, headaches and light-headedness.  Hematological:  Does not bruise/bleed easily.  Psychiatric/Behavioral:  The patient is nervous/anxious.      PHYSICAL EXAM:  ECOG PERFORMANCE STATUS: 0 - Asymptomatic  There were no vitals filed for this visit. There were no vitals filed for this visit. Physical Exam Constitutional:      Appearance: Normal  appearance.  Cardiovascular:     Heart sounds: Normal heart sounds.  Pulmonary:     Breath sounds: Normal breath sounds.  Neurological:     General: No focal deficit present.     Mental Status: Mental status is at baseline.  Psychiatric:        Behavior: Behavior normal. Behavior is cooperative.     PAST MEDICAL/SURGICAL HISTORY:  Past Medical History:  Diagnosis Date   Allergy    Phreesia 05/26/2020   Anxiety    Phreesia 05/26/2020   ASCUS of cervix with negative high risk HPV 07/29/2021   07/28/21 repeat in 3 years per [facility], 5 year risk for CIN3+ is 0.40%   Asthma    Phreesia 05/26/2020   Contraceptive education 01/26/2014   Erythrocytosis    Medical history non-contributory    Moderate persistent asthma, uncomplicated 05/19/2019   Multiple allergies 06/11/2020   Past Surgical History:  Procedure Laterality Date   NO PAST SURGERIES      SOCIAL HISTORY:  Social History   Socioeconomic History   Marital status: Married    Spouse name: Not on file   Number of children: 1   Years of education: Not on file   Highest education level: Bachelor's degree (e.g., BA, AB, BS)  Occupational History    Employer: Airline pilot  Tobacco Use   Smoking status: Every Day    Packs/day: 0.50    Years: 14.00    Additional pack years: 0.00  Total pack years: 7.00    Types: Cigarettes   Smokeless tobacco: Never   Tobacco comments:    Smokes about 0.5-.75 a pack a day 10/26/2022   Vaping Use   Vaping Use: Never used  Substance and Sexual Activity   Alcohol use: Yes    Alcohol/week: 14.0 standard drinks of alcohol    Types: 14 Glasses of wine per week    Comment: glass of wine every night     Drug use: No   Sexual activity: Yes    Birth control/protection: Other-see comments    Comment: partner has vasectomy  Other Topics Concern   Not on file  Social History Narrative   Lives with husband and son      Dog: Pitbull: Lana       Enjoys:  reading-reformed theology; fiction; drink wine, buy shoes, playing cards      Diet: eats all food groups outside of meat-some chicken at times   Caffeine: 3 cups of expresso daily    Water: 24 oz x 3 bottles daily       Wears seat belt   Does not use phone while driving   Multimedia programmer in a lock box   Social Determinants of Health   Financial Resource Strain: Low Risk  (07/21/2021)   Overall Financial Resource Strain (CARDIA)    Difficulty of Paying Living Expenses: Not very hard  Food Insecurity: No Food Insecurity (07/21/2021)   Hunger Vital Sign    Worried About Running Out of Food in the Last Year: Never true    Ran Out of Food in the Last Year: Never true  Transportation Needs: No Transportation Needs (07/21/2021)   PRAPARE - Administrator, Civil Service (Medical): No    Lack of Transportation (Non-Medical): No  Physical Activity: Insufficiently Active (07/21/2021)   Exercise Vital Sign    Days of Exercise per Week: 3 days    Minutes of Exercise per Session: 30 min  Stress: Stress Concern Present (07/21/2021)   [employer] of Occupational Health - Occupational Stress Questionnaire    Feeling of Stress : Very much  Social Connections: Moderately Isolated (07/21/2021)   Social Connection and Isolation Panel [NHANES]    Frequency of Communication with Friends and Family: More than three times a week    Frequency of Social Gatherings with Friends and Family: Once a week    Attends Religious Services: Never    Database administrator or Organizations: No    Attends Banker Meetings: Never    Marital Status: Married  Catering manager Violence: Not At Risk (07/21/2021)   Humiliation, Afraid, Rape, and Kick questionnaire    Fear of Current or Ex-Partner: No    Emotionally Abused: No    Physically Abused: No    Sexually Abused: No    FAMILY HISTORY:  Family History  Problem Relation Age of Onset   Heart disease Father        heart attack    Allergic rhinitis Father    Diabetes Mother    Depression Mother    Anxiety disorder Mother    Heart disease Mother    Heart disease Maternal Grandmother        CHF   Asthma Maternal Grandmother    Diabetes Maternal Grandmother    High Cholesterol Sister    Kidney Stones Brother    Alzheimer's disease Maternal Grandfather    Asthma Maternal Grandfather    Diabetes Maternal  Uncle    Angioedema Neg Hx    Immunodeficiency Neg Hx     CURRENT MEDICATIONS:  Outpatient Encounter Medications as of 05/13/2023  Medication Sig   azelastine (ASTELIN) 0.1 % nasal spray Place 2 sprays into both nostrils 2 (two) times daily.   clindamycin (CLINDAGEL) 1 % gel APPLY TO AFFECTED AREA TWICE A DAY   EPINEPHrine (AUVI-Q) 0.3 mg/0.3 mL IJ SOAJ injection Inject 0.3 mg into the muscle as needed for anaphylaxis.   fexofenadine (ALLEGRA) 180 MG tablet Take 1 tablet (180 mg total) by mouth daily.   fluticasone (FLONASE) 50 MCG/ACT nasal spray SPRAY 2 SPRAYS INTO EACH NOSTRIL EVERY DAY   levalbuterol (XOPENEX HFA) 45 MCG/ACT inhaler TAKE 2 PUFFS BY MOUTH EVERY 6 HOURS AS NEEDED FOR WHEEZE OR SHORTNESS OF BREATH   No facility-administered encounter medications on file as of 05/13/2023.    ALLERGIES:  Allergies  Allergen Reactions   Beef-Derived Products Anaphylaxis    Alpha-Gal Allergy    Meat [Alpha-Gal] Palpitations    All mammal. Symptom asthma .   Pork-Derived Products Anaphylaxis    Alpha-Gal Allergy   Shellfish Allergy Other (See Comments)    SOB   Other Rash and Itching    LABORATORY DATA:  I have reviewed the labs as listed.  CBC    Component Value Date/Time   WBC 8.5 04/14/2023 1520   RBC 5.59 (H) 04/14/2023 1520   HGB 17.6 (H) 04/14/2023 1520   HGB 20.1 (HH) 04/29/2021 0850   HCT 53.9 (H) 04/14/2023 1520   HCT 58.5 (H) 04/29/2021 0850   PLT 169 04/14/2023 1520   PLT 137 (L) 04/29/2021 0850   MCV 96.4 04/14/2023 1520   MCV 95 04/29/2021 0850   MCH 31.5 04/14/2023 1520   MCHC  32.7 04/14/2023 1520   RDW 14.6 04/14/2023 1520   RDW 14.2 04/29/2021 0850   LYMPHSABS 1.7 10/05/2022 1436   LYMPHSABS 1.6 04/29/2021 0850   MONOABS 0.6 10/05/2022 1436   EOSABS 0.1 10/05/2022 1436   EOSABS 0.2 04/29/2021 0850   BASOSABS 0.1 10/05/2022 1436   BASOSABS 0.1 04/29/2021 0850      Latest Ref Rng & Units 04/02/2022    3:28 PM 10/02/2021    3:29 PM 06/02/2021    4:13 PM  CMP  Glucose 70 - 99 mg/dL 98  94  93   BUN 6 - 20 mg/dL 14  11  16    Creatinine 0.44 - 1.00 mg/dL 0.98  1.19  1.47   Sodium 135 - 145 mmol/L 136  134  133   Potassium 3.5 - 5.1 mmol/L 4.2  4.3  4.2   Chloride 98 - 111 mmol/L 104  101  101   CO2 22 - 32 mmol/L 27  26  25    Calcium 8.9 - 10.3 mg/dL 8.9  8.8  8.6   Total Protein 6.5 - 8.1 g/dL 7.3  7.5  6.7   Total Bilirubin 0.3 - 1.2 mg/dL 0.7  1.0  0.9   Alkaline Phos 38 - 126 U/L 66  76  60   AST 15 - 41 U/L 16  20  18    ALT 0 - 44 U/L 16  17  22      DIAGNOSTIC IMAGING:  I have independently reviewed the relevant imaging and discussed with the patient.   WRAP UP:  All questions were answered. The patient knows to call the clinic with any problems, questions or concerns.  Medical decision making: Moderate  Time spent on  visit: I spent 25 minutes counseling the patient face to face. The total time spent in the appointment was 40 minutes and more than 50% was on counseling.  Carnella Guadalajara, PA-C  05/13/2023 7:06 PM

## 2023-05-13 ENCOUNTER — Inpatient Hospital Stay: Payer: BC Managed Care – PPO | Attending: Hematology | Admitting: Physician Assistant

## 2023-05-13 VITALS — BP 139/85 | HR 95 | Temp 98.2°F | Resp 18 | Wt 192.9 lb

## 2023-05-13 DIAGNOSIS — E611 Iron deficiency: Secondary | ICD-10-CM | POA: Diagnosis not present

## 2023-05-13 DIAGNOSIS — F17210 Nicotine dependence, cigarettes, uncomplicated: Secondary | ICD-10-CM | POA: Insufficient documentation

## 2023-05-13 DIAGNOSIS — D751 Secondary polycythemia: Secondary | ICD-10-CM | POA: Insufficient documentation

## 2023-05-13 NOTE — Patient Instructions (Signed)
[facility] **VISIT SUMMARY & IMPORTANT INSTRUCTIONS **   You were seen today by Rojelio Brenner PA-C for your high red blood cells.    Your high hemoglobin is most likely due to smoking.  You may or may not also have some underlying sleep apnea. We discussed the following plan: Quit smoking no later than the end of July 2024 Drink at least 64 ounces of water daily Phlebotomy every 2 months  FOLLOW-UP APPOINTMENT: Follow up in 6 months  ** Thank you for trusting me with your healthcare!  I strive to provide all of my patients with quality care at each visit.  If you receive a survey for this visit, I would be so grateful to you for taking the time to provide feedback.  Thank you in advance!  ~ Siera Beyersdorf                   Dr. Doreatha Massed   &   Rojelio Brenner, PA-C   - - - - - - - - - - - - - - - - - -    Thank you for choosing  Cancer Center at [facility] to provide your oncology and hematology care.  To afford each patient quality time with our provider, please arrive at least 15 minutes before your scheduled appointment time.   If you have a lab appointment with the Cancer Center please come in thru the Main Entrance and check in at the main information desk.  You need to re-schedule your appointment should you arrive 10 or more minutes late.  We strive to give you quality time with our providers, and arriving late affects you and other patients whose appointments are after yours.  Also, if you no show three or more times for appointments you may be dismissed from the clinic at the providers discretion.     Again, thank you for choosing [facility].  Our hope is that these requests will decrease the amount of time that you wait before being seen by our physicians.       _____________________________________________________________  Should you have questions after your visit to [facility],  please contact our office at [phone] and follow the prompts.  Our office hours are 8:00 a.m. and 4:30 p.m. Monday - Friday.  Please note that voicemails left after 4:00 p.m. may not be returned until the following business day.  We are closed weekends and major holidays.  You do have access to a nurse 24-7, just call the main number to the clinic [phone] and do not press any options, hold on the line and a nurse will answer the phone.    For prescription refill requests, have your pharmacy contact our office and allow 72 hours.

## 2023-05-21 ENCOUNTER — Inpatient Hospital Stay: Payer: BC Managed Care – PPO

## 2023-05-21 DIAGNOSIS — E611 Iron deficiency: Secondary | ICD-10-CM | POA: Diagnosis not present

## 2023-05-21 DIAGNOSIS — D751 Secondary polycythemia: Secondary | ICD-10-CM | POA: Diagnosis not present

## 2023-05-21 DIAGNOSIS — F17210 Nicotine dependence, cigarettes, uncomplicated: Secondary | ICD-10-CM | POA: Diagnosis not present

## 2023-05-21 NOTE — Patient Instructions (Signed)
[facility]  Discharge Instructions: Thank you for choosing [facility] to provide your oncology and hematology care.  If you have a lab appointment with the Cancer Center - please note that after April 8th, 2024, all labs will be drawn in the cancer center.  You do not have to check in or register with the main entrance as you have in the past but will complete your check-in in the cancer center.  Wear comfortable clothing and clothing appropriate for easy access to any Portacath or PICC line.   We strive to give you quality time with your provider. You may need to reschedule your appointment if you arrive late (15 or more minutes).  Arriving late affects you and other patients whose appointments are after yours.  Also, if you miss three or more appointments without notifying the office, you may be dismissed from the clinic at the provider's discretion.      For prescription refill requests, have your pharmacy contact our office and allow 72 hours for refills to be completed.    Today you received the following phlebotomy, return as scheduled.   To help prevent nausea and vomiting after your treatment, we encourage you to take your nausea medication as directed.  BELOW ARE SYMPTOMS THAT SHOULD BE REPORTED IMMEDIATELY: *FEVER GREATER THAN 100.4 F (38 C) OR HIGHER *CHILLS OR SWEATING *NAUSEA AND VOMITING THAT IS NOT CONTROLLED WITH YOUR NAUSEA MEDICATION *UNUSUAL SHORTNESS OF BREATH *UNUSUAL BRUISING OR BLEEDING *URINARY PROBLEMS (pain or burning when urinating, or frequent urination) *BOWEL PROBLEMS (unusual diarrhea, constipation, pain near the anus) TENDERNESS IN MOUTH AND THROAT WITH OR WITHOUT PRESENCE OF ULCERS (sore throat, sores in mouth, or a toothache) UNUSUAL RASH, SWELLING OR PAIN  UNUSUAL VAGINAL DISCHARGE OR ITCHING   Items with * indicate a potential emergency and should be followed up as soon as possible or go to the Emergency Department  if any problems should occur.  Please show the CHEMOTHERAPY ALERT CARD or IMMUNOTHERAPY ALERT CARD at check-in to the Emergency Department and triage nurse.  Should you have questions after your visit or need to cancel or reschedule your appointment, please contact [facility] [phone]  and follow the prompts.  Office hours are 8:00 a.m. to 4:30 p.m. Monday - Friday. Please note that voicemails left after 4:00 p.m. may not be returned until the following business day.  We are closed weekends and major holidays. You have access to a nurse at all times for urgent questions. Please call the main number to the clinic [phone] and follow the prompts.  For any non-urgent questions, you may also contact your provider using MyChart. We now offer e-Visits for anyone 57 and older to request care online for non-urgent symptoms. For details visit [email or web address].   Also download the MyChart app! Go to the app store, search "MyChart", open the app, select [facility], and log in with your MyChart username and password.

## 2023-05-21 NOTE — Progress Notes (Signed)
Lindsey Jensen presents today for phlebotomy per MD orders. Phlebotomy procedure started at 1328 and ended at 1325. 500 cc removed. Patient tolerated procedure well. IV needle removed intact.

## 2023-06-01 ENCOUNTER — Ambulatory Visit: Payer: BC Managed Care – PPO | Admitting: Professional

## 2023-06-02 ENCOUNTER — Ambulatory Visit (INDEPENDENT_AMBULATORY_CARE_PROVIDER_SITE_OTHER): Payer: BC Managed Care – PPO | Admitting: Professional

## 2023-06-02 ENCOUNTER — Encounter: Payer: Self-pay | Admitting: Professional

## 2023-06-02 DIAGNOSIS — F411 Generalized anxiety disorder: Secondary | ICD-10-CM

## 2023-06-02 DIAGNOSIS — F4001 Agoraphobia with panic disorder: Secondary | ICD-10-CM

## 2023-06-02 NOTE — Progress Notes (Signed)
[facility] Counselor/Therapist Progress Note  Patient ID: Lindsey Jensen, MRN: [id number],    Date: 06/02/2023  Time Spent: 44 minutes 101-145pm  Treatment Type: Individual Therapy  Risk Assessment: Danger to Self:  No Self-injurious Behavior: No Danger to Others: No  Subjective: This session was held via video teletherapy. The patient consented to video teletherapy and was located in her car during this session. She is aware it is the responsibility of the patient to secure confidentiality on her end of the session. The provider was in a private home office for the duration of this session.    The patient arrived late for her Caregility appointment.  Issues: 1-anxiety a-chaotic Monday at home and work -she wad dehydrated -she was doing physical tasks -pt took her pulse and it was 120 and she did not consider the physical exercise -she became more freaked out and ended up leaving work -after an our of lying down on sofa she was fine and was she and spouse made the best of th remainder of their day -pt has talked herself out of the anxiety in recent past but has not been doing -sh is easily getting overwhelmed but continuous to be resistant to medication -needs to manager her stress more  -pt admits that she gets in her own way  December 2023 Treatment Plan Problems Addressed  Anxiety Goals 1. Enhance ability to effectively cope with the full variety of life's worries and anxieties. 2. Learn and implement coping skills that result in a reduction of anxiety and worry, and improved daily functioning. 3. Reduce overall frequency, intensity, and duration of the anxiety so that daily functioning is not impaired. Objective Describe situations, thoughts, feelings, and actions associated with anxieties and worries, their impact on functioning, and attempts to resolve them. Target Date: 2023-12-03 Frequency: Biweekly  Progress: 80 Modality: individual  Related  Interventions Ask the client to describe his/her past experiences of anxiety and their impact on functioning; assess the focus, excessiveness, and uncontrollability of the worry and the type, frequency, intensity, and duration of his/her anxiety symptoms (consider using a structured interview such as The Anxiety Disorders Interview Schedule-Adult Version). Objective Learn and implement calming skills to reduce overall anxiety and manage anxiety symptoms. Target Date: 2023-12-03 Frequency: Biweekly  Progress: 40 Modality: individual  Related Interventions Teach the client calming/relaxation skills (e.g., applied relaxation, progressive muscle relaxation, cue controlled relaxation; mindful breathing; biofeedback) and how to discriminate better between relaxation and tension; teach the client how to apply these skills to his/her daily life (e.g., New Directions in Progressive Muscle Relaxation by Marcelyn Ditty, and Hazlett-Stevens; Treating Generalized Anxiety Disorder by Rygh and Ida Rogue). Assign the client homework each session in which he/she practices relaxation exercises daily, gradually applying them progressively from non-anxiety-provoking to anxiety-provoking situations; review and reinforce success while providing corrective feedback toward improvement. Assign the client to read about progressive muscle relaxation and other calming strategies in relevant books or treatment manuals (e.g., Progressive Relaxation Training by Robb Matar and Alen Blew; Mastery of Your Anxiety and Worry: Workbook by Earlie Counts). Objective Verbalize an understanding of the role that cognitive biases play in excessive irrational worry and persistent anxiety symptoms. Target Date: 2023-12-03 Frequency: Biweekly  Progress: 100 Modality: individual  Related Interventions Discuss examples demonstrating that unrealistic worry typically overestimates the probability of threats and underestimates or overlooks the  client's ability to manage realistic demands (or assign "Past Successful Anxiety Coping" in the Adult Psychotherapy Homework Planner by [facility]). Assist the client in analyzing his/her  worries by examining potential biases such as the probability of the negative expectation occurring, the real consequences of it occurring, his/her ability to control the outcome, the worst possible outcome, and his/her ability to accept it (see "Analyze the Probability of a Feared Event" in the Adult Psychotherapy Homework Planner by Stephannie Li; Cognitive Therapy of Anxiety Disorders by Laurence Slate). Help the client gain insight into the notion that worry may function as a form of avoidance of a feared problem and that it creates acute and chronic tension. Objective Identify, challenge, and replace biased, fearful self-talk with positive, realistic, and empowering self-talk. Target Date: 2023-12-03 Frequency: Biweekly  Progress: 70 Modality: individual  Related Interventions Explore the client's schema and self-talk that mediate his/her fear response; assist him/her in challenging the biases; replace the distorted messages with reality-based alternatives and positive, realistic self-talk that will increase his/her self-confidence in coping with irrational fears (see Cognitive Therapy of Anxiety Disorders by Laurence Slate). Assign the client a homework exercise in which he/she identifies fearful self-talk, identifies biases in the self-talk, generates alternatives, and tests through behavioral experiments (or assign "Negative Thoughts Trigger Negative Feelings" in the Adult Psychotherapy Homework Planner by [facility]); review and reinforce success, providing corrective feedback toward improvement. Objective Undergo gradual repeated imaginal exposure to the feared negative consequences predicted by worries and develop alternative reality-based predictions. Target Date: 2023-12-03 Frequency: Biweekly  Progress: 60 Modality:  individual  Related Interventions Direct and assist the client in constructing a hierarchy of two to three spheres of worry for use in exposure (e.g., worry about harm to others, financial difficulties, relationship problems). Ask the client to vividly imagine worst-case consequences of worries, holding them in mind until anxiety associated with them weakens (up to 30 minutes); generate reality-based alternatives to that worst case and process them (see Mastery of Your Anxiety and Worry: Therapist Guide by Rosiland Oz). Assign the client a homework exercise in which he/she does worry exposures and records responses (see Mastery of Your Anxiety and Worry: Workbook by Elenora Fender and Filbert Schilder or Generalized Anxiety Disorder by Elesa Hacker, and Filbert Schilder); review, reinforce success, and provide corrective feedback toward improvement. Objective Learn and implement relapse prevention strategies for managing possible future anxiety symptoms. Target Date: 2023-12-03 Frequency: Biweekly  Progress: 60 Modality: individual  Related Interventions Discuss with the client the distinction between a lapse and relapse, associating a lapse with an initial and reversible return of worry, anxiety symptoms, or urges to avoid, and relapse with the decision to continue the fearful and avoidant patterns. Identify and rehearse with the client the management of future situations or circumstances in which lapses could occur. Instruct the client to routinely use new therapeutic skills (e.g., relaxation, cognitive restructuring, exposure, and problem-solving) in daily life to address emergent worries, anxiety, and avoidant tendencies. Develop a "coping card" on which coping strategies and other important information (e.g., "Breathe deeply and relax," "Challenge unrealistic worries," "Use problem-solving") are written for the client's later use. Schedule periodic "maintenance" sessions to help the client maintain  therapeutic gains. Objective Learn to accept limitations in life and commit to tolerating, rather than avoiding, unpleasant emotions while accomplishing meaningful goals. Target Date: 2023-12-03 Frequency: Biweekly  Progress: 70 Modality: individual  Related Interventions Use techniques from Acceptance and Commitment Therapy to help client accept uncomfortable realities such as lack of complete control, imperfections, and uncertainty and tolerate unpleasant emotions and thoughts in order to accomplish value-consistent goals. 4. Stabilize anxiety level while increasing ability to function on a daily  basis.  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

## 2023-06-21 ENCOUNTER — Ambulatory Visit: Payer: BC Managed Care – PPO | Admitting: Professional

## 2023-06-22 ENCOUNTER — Ambulatory Visit (INDEPENDENT_AMBULATORY_CARE_PROVIDER_SITE_OTHER): Payer: BC Managed Care – PPO | Admitting: Professional

## 2023-06-22 ENCOUNTER — Encounter: Payer: Self-pay | Admitting: Professional

## 2023-06-22 DIAGNOSIS — F411 Generalized anxiety disorder: Secondary | ICD-10-CM | POA: Diagnosis not present

## 2023-06-22 DIAGNOSIS — F4001 Agoraphobia with panic disorder: Secondary | ICD-10-CM

## 2023-06-22 NOTE — Progress Notes (Signed)
Lindsey Jensen  Patient ID: Lindsey Jensen, MRN: [id number],    Date: 06/22/2023  Time Spent: 39 minutes 401-440pm  Treatment Type: Individual Therapy  Risk Assessment: Danger to Self:  No Self-injurious Behavior: No Danger to Others: No  Subjective: This session was held via video teletherapy. The patient consented to video teletherapy and was located in her home during this session. She is aware it is the responsibility of the patient to secure confidentiality on her end of the session. The provider was in a private home office for the duration of this session.    The patient arrived on time for her Caregility appointment.  Issues: 1-homework-not completed -begin building self-care plan 2-anxiety a-pt had a great week last week and was very much in control of her anxiety -she actively used the ABC's of CBT -pt felt like "my brain was there" -pt stopped using the skills after the good week -pt has been feeling very happy lately that she is able to go back into [employer] and [employer] and she could not imagine doing so 2-3 months ago -pushing through despite anxious feelings -using positive self-talk to get through difficult emotions -remember to use phrases such as Just Do It or I got this to help work through the feelings  December 2023 Treatment Plan Problems Addressed  Anxiety Goals 1. Enhance ability to effectively cope with the full variety of life's worries and anxieties. 2. Learn and implement coping skills that result in a reduction of anxiety and worry, and improved daily functioning. 3. Reduce overall frequency, intensity, and duration of the anxiety so that daily functioning is not impaired. Objective Describe situations, thoughts, feelings, and actions associated with anxieties and worries, their impact on functioning, and attempts to resolve them. Target Date: 2023-12-03 Frequency: Biweekly  Progress: 80 Modality:  individual  Related Interventions Ask the client to describe his/her past experiences of anxiety and their impact on functioning; assess the focus, excessiveness, and uncontrollability of the worry and the type, frequency, intensity, and duration of his/her anxiety symptoms (consider using a structured interview such as The Anxiety Disorders Interview Schedule-Adult Version). Objective Learn and implement calming skills to reduce overall anxiety and manage anxiety symptoms. Target Date: 2023-12-03 Frequency: Biweekly  Progress: 40 Modality: individual  Related Interventions Teach the client calming/relaxation skills (e.g., applied relaxation, progressive muscle relaxation, cue controlled relaxation; mindful breathing; biofeedback) and how to discriminate better between relaxation and tension; teach the client how to apply these skills to his/her daily life (e.g., New Directions in Progressive Muscle Relaxation by Marcelyn Ditty, and Hazlett-Stevens; Treating Generalized Anxiety Disorder by Rygh and Ida Rogue). Assign the client homework each session in which he/she practices relaxation exercises daily, gradually applying them progressively from non-anxiety-provoking to anxiety-provoking situations; review and reinforce success while providing corrective feedback toward improvement. Assign the client to read about progressive muscle relaxation and other calming strategies in relevant books or treatment manuals (e.g., Progressive Relaxation Training by Robb Matar and Alen Blew; Mastery of Your Anxiety and Worry: Workbook by Earlie Counts). Objective Verbalize an understanding of the role that cognitive biases play in excessive irrational worry and persistent anxiety symptoms. Target Date: 2023-12-03 Frequency: Biweekly  Progress: 100 Modality: individual  Related Interventions Discuss examples demonstrating that unrealistic worry typically overestimates the probability of threats and  underestimates or overlooks the client's ability to manage realistic demands (or assign "Past Successful Anxiety Coping" in the Adult Psychotherapy Homework Planner by [facility]). Assist the client in analyzing his/her worries by  examining potential biases such as the probability of the negative expectation occurring, the real consequences of it occurring, his/her ability to control the outcome, the worst possible outcome, and his/her ability to accept it (see "Analyze the Probability of a Feared Event" in the Adult Psychotherapy Homework Planner by Stephannie Li; Cognitive Therapy of Anxiety Disorders by Laurence Slate). Help the client gain insight into the notion that worry may function as a form of avoidance of a feared problem and that it creates acute and chronic tension. Objective Identify, challenge, and replace biased, fearful self-talk with positive, realistic, and empowering self-talk. Target Date: 2023-12-03 Frequency: Biweekly  Progress: 70 Modality: individual  Related Interventions Explore the client's schema and self-talk that mediate his/her fear response; assist him/her in challenging the biases; replace the distorted messages with reality-based alternatives and positive, realistic self-talk that will increase his/her self-confidence in coping with irrational fears (see Cognitive Therapy of Anxiety Disorders by Laurence Slate). Assign the client a homework exercise in which he/she identifies fearful self-talk, identifies biases in the self-talk, generates alternatives, and tests through behavioral experiments (or assign "Negative Thoughts Trigger Negative Feelings" in the Adult Psychotherapy Homework Planner by [facility]); review and reinforce success, providing corrective feedback toward improvement. Objective Undergo gradual repeated imaginal exposure to the feared negative consequences predicted by worries and develop alternative reality-based predictions. Target Date: 2023-12-03 Frequency:  Biweekly  Progress: 60 Modality: individual  Related Interventions Direct and assist the client in constructing a hierarchy of two to three spheres of worry for use in exposure (e.g., worry about harm to others, financial difficulties, relationship problems). Ask the client to vividly imagine worst-case consequences of worries, holding them in mind until anxiety associated with them weakens (up to 30 minutes); generate reality-based alternatives to that worst case and process them (see Mastery of Your Anxiety and Worry: Therapist Guide by Rosiland Oz). Assign the client a homework exercise in which he/she does worry exposures and records responses (see Mastery of Your Anxiety and Worry: Workbook by Elenora Fender and Filbert Schilder or Generalized Anxiety Disorder by Elesa Hacker, and Filbert Schilder); review, reinforce success, and provide corrective feedback toward improvement. Objective Learn and implement relapse prevention strategies for managing possible future anxiety symptoms. Target Date: 2023-12-03 Frequency: Biweekly  Progress: 60 Modality: individual  Related Interventions Discuss with the client the distinction between a lapse and relapse, associating a lapse with an initial and reversible return of worry, anxiety symptoms, or urges to avoid, and relapse with the decision to continue the fearful and avoidant patterns. Identify and rehearse with the client the management of future situations or circumstances in which lapses could occur. Instruct the client to routinely use new therapeutic skills (e.g., relaxation, cognitive restructuring, exposure, and problem-solving) in daily life to address emergent worries, anxiety, and avoidant tendencies. Develop a "coping card" on which coping strategies and other important information (e.g., "Breathe deeply and relax," "Challenge unrealistic worries," "Use problem-solving") are written for the client's later use. Schedule periodic "maintenance" sessions to  help the client maintain therapeutic gains. Objective Learn to accept limitations in life and commit to tolerating, rather than avoiding, unpleasant emotions while accomplishing meaningful goals. Target Date: 2023-12-03 Frequency: Biweekly  Progress: 70 Modality: individual  Related Interventions Use techniques from Acceptance and Commitment Therapy to help client accept uncomfortable realities such as lack of complete control, imperfections, and uncertainty and tolerate unpleasant emotions and thoughts in order to accomplish value-consistent goals. 4. Stabilize anxiety level while increasing ability to function on a daily basis.  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 -meet again on Monday, July 19, 2023 at 9am.

## 2023-06-23 ENCOUNTER — Encounter: Payer: BC Managed Care – PPO | Admitting: Internal Medicine

## 2023-07-15 ENCOUNTER — Inpatient Hospital Stay: Payer: BC Managed Care – PPO

## 2023-07-16 ENCOUNTER — Inpatient Hospital Stay: Payer: BC Managed Care – PPO

## 2023-07-19 ENCOUNTER — Ambulatory Visit: Payer: BC Managed Care – PPO | Admitting: Professional

## 2023-07-23 ENCOUNTER — Inpatient Hospital Stay: Payer: BC Managed Care – PPO

## 2023-08-03 ENCOUNTER — Encounter: Payer: Self-pay | Admitting: Internal Medicine

## 2023-08-04 ENCOUNTER — Other Ambulatory Visit: Payer: Self-pay | Admitting: *Deleted

## 2023-08-04 DIAGNOSIS — D582 Other hemoglobinopathies: Secondary | ICD-10-CM

## 2023-08-06 ENCOUNTER — Inpatient Hospital Stay: Payer: BC Managed Care – PPO | Attending: Hematology

## 2023-08-06 ENCOUNTER — Inpatient Hospital Stay: Payer: BC Managed Care – PPO

## 2023-08-06 DIAGNOSIS — F17210 Nicotine dependence, cigarettes, uncomplicated: Secondary | ICD-10-CM | POA: Diagnosis not present

## 2023-08-06 DIAGNOSIS — D751 Secondary polycythemia: Secondary | ICD-10-CM | POA: Insufficient documentation

## 2023-08-06 NOTE — Patient Instructions (Signed)
[facility]  Discharge Instructions: Thank you for choosing [facility] to provide your oncology and hematology care.  If you have a lab appointment with the Cancer Center - please note that after April 8th, 2024, all labs will be drawn in the cancer center.  You do not have to check in or register with the main entrance as you have in the past but will complete your check-in in the cancer center.  Wear comfortable clothing and clothing appropriate for easy access to any Portacath or PICC line.   We strive to give you quality time with your provider. You may need to reschedule your appointment if you arrive late (15 or more minutes).  Arriving late affects you and other patients whose appointments are after yours.  Also, if you miss three or more appointments without notifying the office, you may be dismissed from the clinic at the provider's discretion.      For prescription refill requests, have your pharmacy contact our office and allow 72 hours for refills to be completed.    Today you had 1/2 phlebotomy per pt request   To help prevent nausea and vomiting after your treatment, we encourage you to take your nausea medication as directed.  BELOW ARE SYMPTOMS THAT SHOULD BE REPORTED IMMEDIATELY: *FEVER GREATER THAN 100.4 F (38 C) OR HIGHER *CHILLS OR SWEATING *NAUSEA AND VOMITING THAT IS NOT CONTROLLED WITH YOUR NAUSEA MEDICATION *UNUSUAL SHORTNESS OF BREATH *UNUSUAL BRUISING OR BLEEDING *URINARY PROBLEMS (pain or burning when urinating, or frequent urination) *BOWEL PROBLEMS (unusual diarrhea, constipation, pain near the anus) TENDERNESS IN MOUTH AND THROAT WITH OR WITHOUT PRESENCE OF ULCERS (sore throat, sores in mouth, or a toothache) UNUSUAL RASH, SWELLING OR PAIN  UNUSUAL VAGINAL DISCHARGE OR ITCHING   Items with * indicate a potential emergency and should be followed up as soon as possible or go to the Emergency Department if any problems should  occur.  Please show the CHEMOTHERAPY ALERT CARD or IMMUNOTHERAPY ALERT CARD at check-in to the Emergency Department and triage nurse.  Should you have questions after your visit or need to cancel or reschedule your appointment, please contact [facility] [phone]  and follow the prompts.  Office hours are 8:00 a.m. to 4:30 p.m. Monday - Friday. Please note that voicemails left after 4:00 p.m. may not be returned until the following business day.  We are closed weekends and major holidays. You have access to a nurse at all times for urgent questions. Please call the main number to the clinic [phone] and follow the prompts.  For any non-urgent questions, you may also contact your provider using MyChart. We now offer e-Visits for anyone 31 and older to request care online for non-urgent symptoms. For details visit [email or web address].   Also download the MyChart app! Go to the app store, search "MyChart", open the app, select [facility], and log in with your MyChart username and password.

## 2023-08-06 NOTE — Progress Notes (Signed)
Patient requesting 1/2 phlebotomy today.   Lindsey Jensen presents today for phlebotomy per MD orders. Phlebotomy procedure started at 1350 and ended at 1353. 250 grams removed. Patient observed for 10 minutes after procedure without any incident. Patient tolerated procedure well. IV needle removed intact.  Vitals stable and discharged home from clinic ambulatory. Follow up as scheduled.

## 2023-08-19 ENCOUNTER — Encounter: Payer: Self-pay | Admitting: Obstetrics & Gynecology

## 2023-08-19 ENCOUNTER — Ambulatory Visit (INDEPENDENT_AMBULATORY_CARE_PROVIDER_SITE_OTHER): Payer: BC Managed Care – PPO | Admitting: Obstetrics & Gynecology

## 2023-08-19 VITALS — BP 132/85 | HR 92 | Ht 68.0 in | Wt 196.0 lb

## 2023-08-19 DIAGNOSIS — M940 Chondrocostal junction syndrome [Tietze]: Secondary | ICD-10-CM | POA: Diagnosis not present

## 2023-08-19 NOTE — Progress Notes (Signed)
Chief Complaint  Patient presents with   Right breast pain      37 y.o. G1P1001 Patient's last menstrual period was 07/29/2023. The current method of family planning is none.  Outpatient Encounter Medications as of 08/19/2023  Medication Sig   fexofenadine (ALLEGRA) 180 MG tablet Take 1 tablet (180 mg total) by mouth daily.   fluticasone (FLONASE) 50 MCG/ACT nasal spray SPRAY 2 SPRAYS INTO EACH NOSTRIL EVERY DAY   azelastine (ASTELIN) 0.1 % nasal spray Place 2 sprays into both nostrils 2 (two) times daily. (Patient not taking: Reported on 08/19/2023)   clindamycin (CLINDAGEL) 1 % gel APPLY TO AFFECTED AREA TWICE A DAY (Patient not taking: Reported on 08/19/2023)   EPINEPHrine (AUVI-Q) 0.3 mg/0.3 mL IJ SOAJ injection Inject 0.3 mg into the muscle as needed for anaphylaxis. (Patient not taking: Reported on 08/19/2023)   levalbuterol (XOPENEX HFA) 45 MCG/ACT inhaler TAKE 2 PUFFS BY MOUTH EVERY 6 HOURS AS NEEDED FOR WHEEZE OR SHORTNESS OF BREATH (Patient not taking: Reported on 08/19/2023)   No facility-administered encounter medications on file as of 08/19/2023.    Subjective Pt with 1 week or so of right breast pain Describes it as sharp burning with certain movements No skin changes or  Neber had similar  Also discussed at length her anxiety issues which have created a great deal of problems She is seeing a therapist but is reticent to use meds   Past Medical History:  Diagnosis Date   Allergy    Phreesia 05/26/2020   Anxiety    [employer]reesia 05/26/2020   ASCUS of cervix with negative high risk HPV 07/29/2021   07/28/21 repeat in 3 years per [facility], 5 year risk for CIN3+ is 0.40%   Asthma    [employer]reesia 05/26/2020   Contraceptive education 01/26/2014   Erythrocytosis    Medical history non-contributory    Moderate persistent asthma, uncomplicated 05/19/2019   Multiple allergies 06/11/2020    Past Surgical History:  Procedure Laterality Date   NO PAST SURGERIES      OB  History     Gravida  1   Para  1   Term  1   Preterm  0   AB  0   Living  1      SAB  0   IAB  0   Ectopic  0   Multiple  0   Live Births  1           Allergies  Allergen Reactions   Beef-Derived Products Anaphylaxis    Alpha-Gal Allergy    Meat [Alpha-Gal] Palpitations    All mammal. Symptom asthma .   Pork-Derived Products Anaphylaxis    Alpha-Gal Allergy   Shellfish Allergy Other (See Comments)    SOB   Other Rash and Itching    Social History   Socioeconomic History   Marital status: Married    Spouse name: Not on file   Number of children: 1   Years of education: Not on file   Highest education level: Bachelor's degree (e.g., BA, AB, BS)  Occupational History    Employer: Air[employer] pilot  Tobacco Use   Smoking status: Every Day    Current packs/day: 0.50    Average packs/day: 0.5 packs/day for 14.0 years (7.0 ttl pk-yrs)    Types: Cigarettes   Smokeless tobacco: Never  Vaping Use   Vaping status: Never Used  Substance and Sexual Activity   Alcohol use: Yes  Alcohol/week: 14.0 standard drinks of alcohol    Types: 14 Glasses of wine per week    Comment: glass of wine every night     Drug use: No   Sexual activity: Yes    Birth control/protection: Other-see comments    Comment: partner has vasectomy  Other Topics Concern   Not on file  Social History Narrative   Lives with husband and son      Dog: Pitbull: Lana       Enjoys: reading-reformed theology; fiction; drink wine, buy shoes, playing cards      Diet: eats all food groups outside of meat-some chicken at times   Caffeine: 3 cups of expresso daily    Water: 24 oz x 3 bottles daily       Wears seat belt   Does not use phone while driving   Multimedia programmer in a lock box   Social Determinants of Health   Financial Resource Strain: Low Risk  (07/21/2021)   Overall Financial Resource Strain (CARDIA)    Difficulty of Paying Living Expenses:  Not very hard  Food Insecurity: No Food Insecurity (07/21/2021)   Hunger Vital Sign    Worried About Running Out of Food in the Last Year: Never true    Ran Out of Food in the Last Year: Never true  Transportation Needs: No Transportation Needs (07/21/2021)   PRAPARE - Administrator, Civil Service (Medical): No    Lack of Transportation (Non-Medical): No  Physical Activity: Insufficiently Active (07/21/2021)   Exercise Vital Sign    Days of Exercise per Week: 3 days    Minutes of Exercise per Session: 30 min  Stress: Stress Concern Present (07/21/2021)   [employer] of Occupational Health - Occupational Stress Questionnaire    Feeling of Stress : Very much  Social Connections: Moderately Isolated (07/21/2021)   Social Connection and Isolation Panel [NHANES]    Frequency of Communication with Friends and Family: More than three times a week    Frequency of Social Gatherings with Friends and Family: Once a week    Attends Religious Services: Never    Database administrator or Organizations: No    Attends Engineer, structural: Never    Marital Status: Married    Family History  Problem Relation Age of Onset   Heart disease Father        heart attack   Allergic rhinitis Father    Diabetes Mother    Depression Mother    Anxiety disorder Mother    Heart disease Mother    Heart disease Maternal Grandmother        CHF   Asthma Maternal Grandmother    Diabetes Maternal Grandmother    High Cholesterol Sister    Kidney Stones Brother    Alzheimer's disease Maternal Grandfather    Asthma Maternal Grandfather    Diabetes Maternal Uncle    Angioedema Neg Hx    Immunodeficiency Neg Hx     Medications:       Current Outpatient Medications:    fexofenadine (ALLEGRA) 180 MG tablet, Take 1 tablet (180 mg total) by mouth daily., Disp: 90 tablet, Rfl: 1   fluticasone (FLONASE) 50 MCG/ACT nasal spray, SPRAY 2 SPRAYS INTO EACH NOSTRIL EVERY DAY, Disp: 48 mL, Rfl: 2    azelastine (ASTELIN) 0.1 % nasal spray, Place 2 sprays into both nostrils 2 (two) times daily. (Patient not taking: Reported on 08/19/2023), Disp: 90 mL, Rfl:  2   clindamycin (CLINDAGEL) 1 % gel, APPLY TO AFFECTED AREA TWICE A DAY (Patient not taking: Reported on 08/19/2023), Disp: 30 g, Rfl: 1   EPINEPHrine (AUVI-Q) 0.3 mg/0.3 mL IJ SOAJ injection, Inject 0.3 mg into the muscle as needed for anaphylaxis. (Patient not taking: Reported on 08/19/2023), Disp: 1 each, Rfl: 1   levalbuterol (XOPENEX HFA) 45 MCG/ACT inhaler, TAKE 2 PUFFS BY MOUTH EVERY 6 HOURS AS NEEDED FOR WHEEZE OR SHORTNESS OF BREATH (Patient not taking: Reported on 08/19/2023), Disp: 15 g, Rfl: 1  Objective Blood pressure 132/85, pulse 92, height 5\' 8"  (1.727 m), weight 196 lb (88.9 kg), last menstrual period 07/29/2023.  Right breast without masses discharge skin changes Moving the breast does reveal lateral and medial costochonditis(chest wall pain)questioned pt and she does not know what the initiating factor could have been  Pertinent ROS No burning with urination, frequency or urgency No nausea, vomiting or diarrhea Nor fever chills or other constitutional symptoms   Labs or studies Will order breast imaging studies    Impression + Management Plan: Diagnoses this Encounter::   ICD-10-CM   1. Costochondritis, right sided, medial and lateral  M94.0         Medications prescribed during  this encounter: No orders of the defined types were placed in this encounter.   Labs or Scans Ordered during this encounter: No orders of the defined types were placed in this encounter.     Follow up No follow-ups on file.

## 2023-09-21 ENCOUNTER — Encounter: Payer: Self-pay | Admitting: Hematology

## 2023-10-08 ENCOUNTER — Inpatient Hospital Stay: Payer: BC Managed Care – PPO | Attending: Hematology

## 2023-10-08 DIAGNOSIS — D751 Secondary polycythemia: Secondary | ICD-10-CM | POA: Diagnosis not present

## 2023-10-08 NOTE — Progress Notes (Signed)
Lindsey Jensen presents today for theraputic phlebotomy per MD orders. Last hgb 17.6/hct 53.9 was on 04/14/23 VSS prior to procedure. Pt reports eating before arrival. Procedure started at 1438 using patients right AC. 500 mL of blood removed. Procedure ended at 1444. Gauze and coban applied to [facility], site clean and dry. VSS upon completion of procedure. Pt denies dizziness, lightheadedness, or feeling faint. Observed for 5 minutes post procedure. Discharged in satisfactory condition with follow up instructions.

## 2023-10-08 NOTE — Patient Instructions (Signed)
[facility]  Discharge Instructions: Thank you for choosing [facility] to provide your oncology and hematology care.  If you have a lab appointment with the Cancer Center - please note that after April 8th, 2024, all labs will be drawn in the cancer center.  You do not have to check in or register with the main entrance as you have in the past but will complete your check-in in the cancer center.  Wear comfortable clothing and clothing appropriate for easy access to any Portacath or PICC line.   We strive to give you quality time with your provider. You may need to reschedule your appointment if you arrive late (15 or more minutes).  Arriving late affects you and other patients whose appointments are after yours.  Also, if you miss three or more appointments without notifying the office, you may be dismissed from the clinic at the provider's discretion.      For prescription refill requests, have your pharmacy contact our office and allow 72 hours for refills to be completed.    Today you received therapeutic phlebotomy      BELOW ARE SYMPTOMS THAT SHOULD BE REPORTED IMMEDIATELY: *FEVER GREATER THAN 100.4 F (38 C) OR HIGHER *CHILLS OR SWEATING *NAUSEA AND VOMITING THAT IS NOT CONTROLLED WITH YOUR NAUSEA MEDICATION *UNUSUAL SHORTNESS OF BREATH *UNUSUAL BRUISING OR BLEEDING *URINARY PROBLEMS (pain or burning when urinating, or frequent urination) *BOWEL PROBLEMS (unusual diarrhea, constipation, pain near the anus) TENDERNESS IN MOUTH AND THROAT WITH OR WITHOUT PRESENCE OF ULCERS (sore throat, sores in mouth, or a toothache) UNUSUAL RASH, SWELLING OR PAIN  UNUSUAL VAGINAL DISCHARGE OR ITCHING   Items with * indicate a potential emergency and should be followed up as soon as possible or go to the Emergency Department if any problems should occur.  Please show the CHEMOTHERAPY ALERT CARD or IMMUNOTHERAPY ALERT CARD at check-in to the Emergency Department  and triage nurse.  Should you have questions after your visit or need to cancel or reschedule your appointment, please contact [facility] [phone]  and follow the prompts.  Office hours are 8:00 a.m. to 4:30 p.m. Monday - Friday. Please note that voicemails left after 4:00 p.m. may not be returned until the following business day.  We are closed weekends and major holidays. You have access to a nurse at all times for urgent questions. Please call the main number to the clinic [phone] and follow the prompts.  For any non-urgent questions, you may also contact your provider using MyChart. We now offer e-Visits for anyone 18 and older to request care online for non-urgent symptoms. For details visit [email or web address].   Also download the MyChart app! Go to the app store, search "MyChart", open the app, select [facility], and log in with your MyChart username and password.   

## 2023-11-04 ENCOUNTER — Inpatient Hospital Stay: Payer: BC Managed Care – PPO | Attending: Hematology

## 2023-11-04 ENCOUNTER — Other Ambulatory Visit: Payer: Self-pay

## 2023-11-04 DIAGNOSIS — D751 Secondary polycythemia: Secondary | ICD-10-CM | POA: Diagnosis present

## 2023-11-04 DIAGNOSIS — F17210 Nicotine dependence, cigarettes, uncomplicated: Secondary | ICD-10-CM | POA: Diagnosis not present

## 2023-11-04 DIAGNOSIS — D582 Other hemoglobinopathies: Secondary | ICD-10-CM

## 2023-11-04 DIAGNOSIS — D509 Iron deficiency anemia, unspecified: Secondary | ICD-10-CM | POA: Diagnosis not present

## 2023-11-04 LAB — CBC
HCT: 49.6 % — ABNORMAL HIGH (ref 36.0–46.0)
Hemoglobin: 16.3 g/dL — ABNORMAL HIGH (ref 12.0–15.0)
MCH: 31.1 pg (ref 26.0–34.0)
MCHC: 32.9 g/dL (ref 30.0–36.0)
MCV: 94.7 fL (ref 80.0–100.0)
Platelets: 156 10*3/uL (ref 150–400)
RBC: 5.24 MIL/uL — ABNORMAL HIGH (ref 3.87–5.11)
RDW: 15.7 % — ABNORMAL HIGH (ref 11.5–15.5)
WBC: 8.2 10*3/uL (ref 4.0–10.5)
nRBC: 0 % (ref 0.0–0.2)

## 2023-11-11 ENCOUNTER — Inpatient Hospital Stay (HOSPITAL_BASED_OUTPATIENT_CLINIC_OR_DEPARTMENT_OTHER): Payer: BC Managed Care – PPO | Admitting: Oncology

## 2023-11-11 VITALS — BP 142/93 | HR 87 | Temp 98.2°F | Resp 20 | Wt 200.0 lb

## 2023-11-11 DIAGNOSIS — Z720 Tobacco use: Secondary | ICD-10-CM | POA: Diagnosis not present

## 2023-11-11 DIAGNOSIS — D751 Secondary polycythemia: Secondary | ICD-10-CM

## 2023-11-11 DIAGNOSIS — D509 Iron deficiency anemia, unspecified: Secondary | ICD-10-CM | POA: Diagnosis not present

## 2023-11-11 NOTE — Progress Notes (Signed)
[facility] [address], [place] [address]   CLINIC:  Medical Oncology/Hematology  PCP:  Anabel Halon, MD [address] [place] [address] [phone]   REASON FOR VISIT:  Follow-up for erythrocytosis  CURRENT THERAPY: Intermittent phlebotomy  INTERVAL HISTORY:   [name] 37 y.o. female returns for routine follow-up of erythrocytosis.  She was last seen by Rojelio Brenner, PA on 05/20/23.  She underwent therapeutic phlebotomy approximately every 3 to 4 months.  Her last phlebotomy was on 10/08/23.   At today's visit, she reports feeling stable.  She presents today with her husband.  Denies any recent hospitalizations, surgeries or changes to her baseline health.  She continues to smoke cigarettes 0.75 packs/day.  She denies the use of diuretics or hormone supplements.  Denies history of sleep apnea and does not believe she snores.  She denies any carbon monoxide exposure or underlying cardiopulmonary disease. She does not have any other cardiac risk factors such as hypertension, hyperlipidemia, or diabetes.  No family history of MPN.  She has had intermittent erythromelalgia (hands turn red with burning pain), which appears to improve after phlebotomies.  Has occasional dizziness and headaches but feels like it is due to looking at the computer screen for too long.  Which is improved after phlebotomy.  She reports rare tinnitus and occasional blurry vision from "looking at a computer screen too long."  She denies any dizziness, strokelike symptoms, neuropathy, B symptoms, abdominal pain, nausea, early satiety.  Reports she has been drinking 2 to 3 glasses of chamomile tea each day to help with her anxiety.  Feels like it is helping.  Has occasional palpitations they are self-limiting.  Appetite is 100% energy levels are 75%.  She denies any pain.  Weight is stable.  She denies any recent hospitalizations, surgeries or changes to her baseline  health.  ASSESSMENT & PLAN:  1.  Erythrocytosis - Intermittently elevated hemoglobin since 2014, with persistent elevations since October 2019 - Hematology workup revealed NEGATIVE mutational workup for JAK2, CALR, and MPL.  She had elevated carboxyhemoglobin at 4.2.  Erythropoietin was 12.7 (normal). - Echo on 03/05/2020 shows EF 60 to 65% with normal function.  Atria were normal. - Ultrasound of the abdomen on 04/05/2020 did not show any cystic lesions in the liver or kidneys.  Spleen size was normal. - Current everyday smoker, 0.75 PPD cigarettes - No diuretics, hormone supplements, cardiopulmonary disease, or carbon monoxide exposure.  No family history of MPN. - No history of sleep apnea, but reports to mild snoring, not feeling rested in the mornings, and feeling sleepy throughout the day - Symptomatic with intermittent erythromelalgia, rare tinnitus and occasional blurry vision.  No B symptoms or symptoms of splenomegaly. - No history of DVT or PE. - She has been receiving therapeutic phlebotomy every 2-3 months (most recently on 10/08/2023) for symptom relief.   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  Unable to rule out bone marrow disorder at this time, as patient has declined bone marrow biopsy in the past. - Discussed with patient that primary treatment of secondary throat cytosis is aimed at causative factor, in this case smoking cessation.  2.  Iron deficiency - No rectal bleeding or melena.  Periods are "normal." - She has been receiving periodic phlebotomy for her erythrocytosis. - Ferritin was 10 (10/05/2022) with 10% iron saturation/TIBC 549 (11/08/2020) - Etiology of iron deficiency is most likely regular blood loss (therapeutic phlebotomy, menstrual  bleeding) and increased iron demand (overproduction of RBCs, which is seen in both primary and secondary polycythemia).   3.  Tobacco use: - She is continuing to smoke 0.75 PPD  cigarettes per day.  PLAN: 1. Erythrocytosis - Would recommend therapeutic phlebotomy to maintain HCT <54.0 or for treatment of vasomotor symptoms. - Most recent labs from 11/04/2023 show a hemoglobin of 16.3/hematocrit 49.6 with normal platelets 156 and differential.  WBC is WNL. - Continue CBC + phlebotomy every 8 weeks.  RTC in 6 months. - No indication for aspirin. - She continues to smoke cigarettes and is not interested in quitting at this time. -Will continue to monitor every 6 months or minus a day phlebotomy based on symptoms and labs. -Discussed sleep study if numbers continually trend up.  Will hold off on bone marrow at this time. - If she continues to have elevated hemoglobin 3 to 4 months after smoking   2. Iron deficiency anemia, unspecified iron deficiency anemia type - Administering iron to patient with secondary erythrocytosis who also has iron deficiency due to increased demand for RBC production does not inherently make erythrocytosis worse, but careful monitoring is key.  Treatment of iron deficiency in patients with secondary erythrocytosis seems to replenish iron stores safely, with transferrin saturation less than 20% below treatment threshold. -She has not had her iron levels checked in over a year.  Recheck weeks when she comes for her next phlebotomy. -We discussed iron repletion should these levels to be low.  3. Tobacco abuse -She is not interested to quit smoking at this time.  PLAN SUMMARY: >> CBC + Phlebotomy every 8 weeks >> Labs in 6 months = CBC/D, CMP, LDH, ferritin, iron/TIBC, erythropoietin, carbon monoxide, carboxyhemoglobin >> OFFICE visit in 6 months (2 weeks after labs)     REVIEW OF SYSTEMS:   Review of Systems  Constitutional:  Negative for appetite change, chills, diaphoresis, fatigue, fever and unexpected weight change.  HENT:   Negative for lump/mass and nosebleeds.   Eyes:  Negative for eye problems.  Respiratory:  Negative for cough,  hemoptysis and shortness of breath.   Cardiovascular:  Negative for chest pain, leg swelling and palpitations.  Gastrointestinal:  Negative for abdominal pain, blood in stool, constipation, diarrhea, nausea and vomiting.  Genitourinary:  Negative for hematuria.   Skin: Negative.   Neurological:  Negative for dizziness, headaches and light-headedness.  Hematological:  Does not bruise/bleed easily.  Psychiatric/Behavioral:  The patient is nervous/anxious.      PHYSICAL EXAM:  ECOG PERFORMANCE STATUS: 0 - Asymptomatic  Vitals:   11/11/23 1434  BP: (!) 142/93  Pulse: 87  Resp: 20  Temp: 98.2 F (36.8 C)  SpO2: 99%   Filed Weights   11/11/23 1434  Weight: 199 lb 15.3 oz (90.7 kg)   Physical Exam Constitutional:      Appearance: Normal appearance.  Cardiovascular:     Heart sounds: Normal heart sounds.  Pulmonary:     Breath sounds: Normal breath sounds.  Neurological:     General: No focal deficit present.     Mental Status: Mental status is at baseline.  Psychiatric:        Behavior: Behavior normal. Behavior is cooperative.    PAST MEDICAL/SURGICAL HISTORY:  Past Medical History:  Diagnosis Date   Allergy    Phreesia 05/26/2020   Anxiety    Phreesia 05/26/2020   ASCUS of cervix with negative high risk HPV 07/29/2021   07/28/21 repeat in 3 years per [facility], 5  year risk for CIN3+ is 0.40%   Asthma    Phreesia 05/26/2020   Contraceptive education 01/26/2014   Erythrocytosis    Medical history non-contributory    Moderate persistent asthma, uncomplicated 05/19/2019   Multiple allergies 06/11/2020   Past Surgical History:  Procedure Laterality Date   NO PAST SURGERIES      SOCIAL HISTORY:  Social History   Socioeconomic History   Marital status: Married    Spouse name: Not on file   Number of children: 1   Years of education: Not on file   Highest education level: Bachelor's degree (e.g., BA, AB, BS)  Occupational History    Employer: Materials engineer  Tobacco Use   Smoking status: Every Day    Current packs/day: 0.50    Average packs/day: 0.5 packs/day for 14.0 years (7.0 ttl pk-yrs)    Types: Cigarettes   Smokeless tobacco: Never  Vaping Use   Vaping status: Never Used  Substance and Sexual Activity   Alcohol use: Yes    Alcohol/week: 14.0 standard drinks of alcohol    Types: 14 Glasses of wine per week    Comment: glass of wine every night     Drug use: No   Sexual activity: Yes    Birth control/protection: Other-see comments    Comment: partner has vasectomy  Other Topics Concern   Not on file  Social History Narrative   Lives with husband and son      Dog: Pitbull: Lana       Enjoys: reading-reformed theology; fiction; drink wine, buy shoes, playing cards      Diet: eats all food groups outside of meat-some chicken at times   Caffeine: 3 cups of expresso daily    Water: 24 oz x 3 bottles daily       Wears seat belt   Does not use phone while driving   Multimedia programmer in a lock box   Social Determinants of Health   Financial Resource Strain: Low Risk  (07/21/2021)   Overall Financial Resource Strain (CARDIA)    Difficulty of Paying Living Expenses: Not very hard  Food Insecurity: No Food Insecurity (07/21/2021)   Hunger Vital Sign    Worried About Running Out of Food in the Last Year: Never true    Ran Out of Food in the Last Year: Never true  Transportation Needs: No Transportation Needs (07/21/2021)   PRAPARE - Administrator, Civil Service (Medical): No    Lack of Transportation (Non-Medical): No  Physical Activity: Insufficiently Active (07/21/2021)   Exercise Vital Sign    Days of Exercise per Week: 3 days    Minutes of Exercise per Session: 30 min  Stress: Stress Concern Present (07/21/2021)   [employer] of Occupational Health - Occupational Stress Questionnaire    Feeling of Stress : Very much  Social Connections: Moderately Isolated (07/21/2021)   Social  Connection and Isolation Panel [NHANES]    Frequency of Communication with Friends and Family: More than three times a week    Frequency of Social Gatherings with Friends and Family: Once a week    Attends Religious Services: Never    Database administrator or Organizations: No    Attends Banker Meetings: Never    Marital Status: Married  Catering manager Violence: Not At Risk (07/21/2021)   Humiliation, Afraid, Rape, and Kick questionnaire    Fear of Current or Ex-Partner: No  Emotionally Abused: No    Physically Abused: No    Sexually Abused: No    FAMILY HISTORY:  Family History  Problem Relation Age of Onset   Heart disease Father        heart attack   Allergic rhinitis Father    Diabetes Mother    Depression Mother    Anxiety disorder Mother    Heart disease Mother    Heart disease Maternal Grandmother        CHF   Asthma Maternal Grandmother    Diabetes Maternal Grandmother    High Cholesterol Sister    Kidney Stones Brother    Alzheimer's disease Maternal Grandfather    Asthma Maternal Grandfather    Diabetes Maternal Uncle    Angioedema Neg Hx    Immunodeficiency Neg Hx     CURRENT MEDICATIONS:  Outpatient Encounter Medications as of 11/11/2023  Medication Sig   azelastine (ASTELIN) 0.1 % nasal spray Place 2 sprays into both nostrils 2 (two) times daily.   clindamycin (CLINDAGEL) 1 % gel APPLY TO AFFECTED AREA TWICE A DAY   EPINEPHrine (AUVI-Q) 0.3 mg/0.3 mL IJ SOAJ injection Inject 0.3 mg into the muscle as needed for anaphylaxis.   fexofenadine (ALLEGRA) 180 MG tablet Take 1 tablet (180 mg total) by mouth daily.   fluticasone (FLONASE) 50 MCG/ACT nasal spray SPRAY 2 SPRAYS INTO EACH NOSTRIL EVERY DAY   levalbuterol (XOPENEX HFA) 45 MCG/ACT inhaler TAKE 2 PUFFS BY MOUTH EVERY 6 HOURS AS NEEDED FOR WHEEZE OR SHORTNESS OF BREATH   No facility-administered encounter medications on file as of 11/11/2023.    ALLERGIES:  Allergies  Allergen  Reactions   Beef-Derived Products Anaphylaxis    Alpha-Gal Allergy    Meat [Alpha-Gal] Palpitations    All mammal. Symptom asthma .   Pork-Derived Products Anaphylaxis    Alpha-Gal Allergy   Shellfish Allergy Other (See Comments)    SOB   Other Rash and Itching    LABORATORY DATA:  I have reviewed the labs as listed.  CBC    Component Value Date/Time   WBC 8.2 11/04/2023 1516   RBC 5.24 (H) 11/04/2023 1516   HGB 16.3 (H) 11/04/2023 1516   HGB 20.1 (HH) 04/29/2021 0850   HCT 49.6 (H) 11/04/2023 1516   HCT 58.5 (H) 04/29/2021 0850   PLT 156 11/04/2023 1516   PLT 137 (L) 04/29/2021 0850   MCV 94.7 11/04/2023 1516   MCV 95 04/29/2021 0850   MCH 31.1 11/04/2023 1516   MCHC 32.9 11/04/2023 1516   RDW 15.7 (H) 11/04/2023 1516   RDW 14.2 04/29/2021 0850   LYMPHSABS 1.7 10/05/2022 1436   LYMPHSABS 1.6 04/29/2021 0850   MONOABS 0.6 10/05/2022 1436   EOSABS 0.1 10/05/2022 1436   EOSABS 0.2 04/29/2021 0850   BASOSABS 0.1 10/05/2022 1436   BASOSABS 0.1 04/29/2021 0850      Latest Ref Rng & Units 04/02/2022    3:28 PM 10/02/2021    3:29 PM 06/02/2021    4:13 PM  CMP  Glucose 70 - 99 mg/dL 98  94  93   BUN 6 - 20 mg/dL 14  11  16    Creatinine 0.44 - 1.00 mg/dL 4.09  8.11  9.14   Sodium 135 - 145 mmol/L 136  134  133   Potassium 3.5 - 5.1 mmol/L 4.2  4.3  4.2   Chloride 98 - 111 mmol/L 104  101  101   CO2 22 - 32 mmol/L 27  26  25   Calcium 8.9 - 10.3 mg/dL 8.9  8.8  8.6   Total Protein 6.5 - 8.1 g/dL 7.3  7.5  6.7   Total Bilirubin 0.3 - 1.2 mg/dL 0.7  1.0  0.9   Alkaline Phos 38 - 126 U/L 66  76  60   AST 15 - 41 U/L 16  20  18    ALT 0 - 44 U/L 16  17  22      DIAGNOSTIC IMAGING:  I have independently reviewed the relevant imaging and discussed with the patient.   WRAP UP:  All questions were answered. The patient knows to call the clinic with any problems, questions or concerns.  Medical decision making: Moderate  Time spent on visit: I spent 25 minutes counseling  the patient face to face. The total time spent in the appointment was 40 minutes and more than 50% was on counseling.  Mauro Kaufmann, NP  05/13/2023 2:39 PM

## 2024-01-06 ENCOUNTER — Other Ambulatory Visit: Payer: Self-pay

## 2024-01-06 DIAGNOSIS — Z720 Tobacco use: Secondary | ICD-10-CM

## 2024-01-06 DIAGNOSIS — D509 Iron deficiency anemia, unspecified: Secondary | ICD-10-CM

## 2024-01-06 DIAGNOSIS — D751 Secondary polycythemia: Secondary | ICD-10-CM

## 2024-01-06 DIAGNOSIS — D582 Other hemoglobinopathies: Secondary | ICD-10-CM

## 2024-01-07 ENCOUNTER — Inpatient Hospital Stay: Payer: BC Managed Care – PPO

## 2024-01-07 ENCOUNTER — Inpatient Hospital Stay: Payer: BC Managed Care – PPO | Attending: Hematology

## 2024-01-07 DIAGNOSIS — D509 Iron deficiency anemia, unspecified: Secondary | ICD-10-CM | POA: Diagnosis not present

## 2024-01-07 DIAGNOSIS — D751 Secondary polycythemia: Secondary | ICD-10-CM

## 2024-01-07 DIAGNOSIS — Z720 Tobacco use: Secondary | ICD-10-CM

## 2024-01-07 DIAGNOSIS — D582 Other hemoglobinopathies: Secondary | ICD-10-CM

## 2024-01-07 LAB — CBC WITH DIFFERENTIAL/PLATELET
Abs Immature Granulocytes: 0.02 10*3/uL (ref 0.00–0.07)
Basophils Absolute: 0.1 10*3/uL (ref 0.0–0.1)
Basophils Relative: 1 %
Eosinophils Absolute: 0.2 10*3/uL (ref 0.0–0.5)
Eosinophils Relative: 2 %
HCT: 53.9 % — ABNORMAL HIGH (ref 36.0–46.0)
Hemoglobin: 17.2 g/dL — ABNORMAL HIGH (ref 12.0–15.0)
Immature Granulocytes: 0 %
Lymphocytes Relative: 23 %
Lymphs Abs: 1.8 10*3/uL (ref 0.7–4.0)
MCH: 29.7 pg (ref 26.0–34.0)
MCHC: 31.9 g/dL (ref 30.0–36.0)
MCV: 93.1 fL (ref 80.0–100.0)
Monocytes Absolute: 0.7 10*3/uL (ref 0.1–1.0)
Monocytes Relative: 8 %
Neutro Abs: 5.2 10*3/uL (ref 1.7–7.7)
Neutrophils Relative %: 66 %
Platelets: 179 10*3/uL (ref 150–400)
RBC: 5.79 MIL/uL — ABNORMAL HIGH (ref 3.87–5.11)
RDW: 15.7 % — ABNORMAL HIGH (ref 11.5–15.5)
WBC: 7.9 10*3/uL (ref 4.0–10.5)
nRBC: 0 % (ref 0.0–0.2)

## 2024-01-07 LAB — COMPREHENSIVE METABOLIC PANEL
ALT: 16 U/L (ref 0–44)
AST: 15 U/L (ref 15–41)
Albumin: 3.7 g/dL (ref 3.5–5.0)
Alkaline Phosphatase: 61 U/L (ref 38–126)
Anion gap: 7 (ref 5–15)
BUN: 13 mg/dL (ref 6–20)
CO2: 26 mmol/L (ref 22–32)
Calcium: 8.6 mg/dL — ABNORMAL LOW (ref 8.9–10.3)
Chloride: 100 mmol/L (ref 98–111)
Creatinine, Ser: 0.82 mg/dL (ref 0.44–1.00)
GFR, Estimated: >60 mL/min (ref >60)
Glucose, Bld: 95 mg/dL (ref 70–99)
Potassium: 4.1 mmol/L (ref 3.5–5.1)
Sodium: 133 mmol/L — ABNORMAL LOW (ref 135–145)
Total Bilirubin: 0.6 mg/dL (ref 0.0–1.2)
Total Protein: 7 g/dL (ref 6.5–8.1)

## 2024-01-07 LAB — IRON AND TIBC
Iron: 60 ug/dL (ref 28–170)
Saturation Ratios: 10 % — ABNORMAL LOW (ref 10.4–31.8)
TIBC: 575 ug/dL — ABNORMAL HIGH (ref 250–450)
UIBC: 515 ug/dL

## 2024-01-07 LAB — CARBOXYHEMOGLOBIN - COOX: Carboxyhemoglobin: 5.1 % (ref 0.5–1.5)

## 2024-01-07 LAB — LACTATE DEHYDROGENASE: LDH: 150 U/L (ref 98–192)

## 2024-01-07 LAB — FERRITIN: Ferritin: 8 ng/mL — ABNORMAL LOW (ref 11–307)

## 2024-01-07 NOTE — Patient Instructions (Signed)

## 2024-01-07 NOTE — Progress Notes (Signed)
Reviewed labs with practitioner.  Patient may have phlebotomy if asymptomatic and feels like she needs one verbal order Durenda Hurt, NP.  Patient stated she felt fine with no complaints and did request for half of a phlebotomy to be done.    [name] presents today for phlebotomy per MD orders. Phlebotomy procedure started at 1331 and ended at 1338. 350 cc removed. Patient tolerated procedure well. IV needle removed intact.  Patient tolerated phlebotomy with no complaints voiced.  Peripheral IV site intact with no bruising or swelling noted.  Denied SOB, chest pain, or dizziness.  Gauze with coban applied.  Discharged with VSS and no s/s of distress noted.

## 2024-01-07 NOTE — Progress Notes (Signed)
Critical value:   Carboxyhemoglobin: 5.1  Durenda Hurt, NP aware, no orders given at this time.

## 2024-01-09 LAB — CARBON MONOXIDE, BLOOD (PERFORMED AT REF LAB): Carbon Monoxide, Blood: 7.8 % — ABNORMAL HIGH (ref 0.0–3.6)

## 2024-01-09 LAB — ERYTHROPOIETIN: Erythropoietin: 93.6 m[IU]/mL — ABNORMAL HIGH (ref 2.6–18.5)

## 2024-01-17 ENCOUNTER — Encounter: Payer: Self-pay | Admitting: Allergy & Immunology

## 2024-01-19 NOTE — Telephone Encounter (Signed)
Just add on late in day to a [place] Day. That is fine with me.

## 2024-01-19 NOTE — Telephone Encounter (Signed)
I don't have any openings for you or Thurston Hole for a few months for an OV that could work with [employer] schedule, would it be okay to double book?

## 2024-01-26 ENCOUNTER — Ambulatory Visit (INDEPENDENT_AMBULATORY_CARE_PROVIDER_SITE_OTHER): Payer: BC Managed Care – PPO | Admitting: Allergy & Immunology

## 2024-01-26 VITALS — BP 142/92 | HR 101 | Temp 98.1°F | Resp 18 | Ht 68.0 in | Wt 199.5 lb

## 2024-01-26 DIAGNOSIS — T7800XA Anaphylactic reaction due to unspecified food, initial encounter: Secondary | ICD-10-CM

## 2024-01-26 DIAGNOSIS — J302 Other seasonal allergic rhinitis: Secondary | ICD-10-CM

## 2024-01-26 DIAGNOSIS — J3089 Other allergic rhinitis: Secondary | ICD-10-CM

## 2024-01-26 DIAGNOSIS — J4520 Mild intermittent asthma, uncomplicated: Secondary | ICD-10-CM | POA: Diagnosis not present

## 2024-01-26 DIAGNOSIS — T782XXD Anaphylactic shock, unspecified, subsequent encounter: Secondary | ICD-10-CM

## 2024-01-26 DIAGNOSIS — T7800XD Anaphylactic reaction due to unspecified food, subsequent encounter: Secondary | ICD-10-CM

## 2024-01-26 MED ORDER — AZELASTINE HCL 0.1 % NA SOLN: 2.0000 | Freq: Two times a day (BID) | NASAL | 3 refills | Status: AC

## 2024-01-26 MED ORDER — FLUTICASONE PROPIONATE 50 MCG/ACT NA SUSP: NASAL | 3 refills | Status: AC

## 2024-01-26 MED ORDER — BEPOTASTINE BESILATE 1.5 % OP SOLN: 2.0000 [drp] | Freq: Two times a day (BID) | OPHTHALMIC | 5 refills | Status: AC | PRN

## 2024-01-26 MED ORDER — LEVALBUTEROL TARTRATE 45 MCG/ACT IN AERO: INHALATION_SPRAY | RESPIRATORY_TRACT | 1 refills | Status: AC

## 2024-01-26 MED ORDER — FEXOFENADINE HCL 180 MG PO TABS: 180.0000 mg | ORAL_TABLET | Freq: Every day | ORAL | 3 refills | Status: AC

## 2024-01-26 MED ORDER — EPINEPHRINE 0.3 MG/0.3ML IJ SOAJ: 0.3000 mg | INTRAMUSCULAR | 1 refills | Status: AC | PRN

## 2024-01-26 NOTE — Progress Notes (Signed)
 FOLLOW UP  Date of Service/Encounter:  01/26/24   Assessment:   Seasonal and perennial allergic rhinitis (dust mites, cat, dog, roach, trees, and ragweed) - seems somewhat well controlled with the use of medications alone   Intermittent episodes of generalized malaise/fatigue for days following her allergy  injections (despite antihistamines and prednisone ) - improved with stopping the allergy  shots   Multiple food allergies versus sensitizations   Mild persistent asthma, uncomplicated    Current smoker - interested in quitting   Erythrocytosis (JAK2 negative)   Fully vaccinated to COVID19 La[place])  Plan/Recommendations:   1. Perennial and seasonal allergic rhinitis (dust mites, cat, dog, roach, trees, ragweed) - We can get the blood work to see if you are allergic to the Can f 5 (present in the prostate and reproductive system of female dogs).  - This may not change anything at all, but it would be interesting to know.  - Continue Allegra  180mg  once a day for a runny nose or itch. - Sample of Ryaltris provided (contains a nasal steroid and nasal antihistamine). - We can send this into the mail order pharmacy and they will call to deliver it.  - Continue saline nasal rinses, to be used before fluticasone .    2. Intermittent asthma, uncomplicated  - Spiro looks great today.  - Daily controller medication(s): NONE - Prior to physical activity: Xopenex  2 puffs 10-15 minutes before physical activity. - Rescue medications: Xopenex  4 puffs every 4-6 hours as needed - Asthma control goals:  * Full participation in all desired activities (may need albuterol  before activity) * Albuterol  use two time or less a week on average (not counting use with activity) * Cough interfering with sleep two time or less a month * Oral steroids no more than once a year * No hospitalizations  3. Multiple food allergies (nuts, red meat, shellfish) - Continue to avoid all of your triggering  foods. - Can can send in an EpiPen  if you would like.   4. Return in about 1 year (around 01/25/2025). You can have the follow up appointment with Dr. Iva or a Nurse Practicioner (our Nurse Practitioners are excellent and always have Physician oversight!).    Subjective:   Lindsey Jensen is a 38 y.o. female presenting today for follow up of  Chief Complaint  Patient presents with   Allergic Rhinitis     Lindsey Jensen has a history of the following: Patient Active Problem List   Diagnosis Date Noted   GAD (generalized anxiety disorder) 12/06/2022   Panic attacks 12/06/2022   Encounter for general adult medical examination with abnormal findings 04/30/2022   ASCUS of cervix with negative high risk HPV 07/29/2021   Recurrent boils 07/21/2021   Anxiety 08/28/2020   Erythrocytosis 01/25/2020   Allergy  to meat 09/06/2019   Seasonal and perennial allergic rhinitis 05/19/2019   Moderate persistent asthma, uncomplicated 05/19/2019   Tobacco abuse 10/07/2017    History obtained from: chart review and patient.  Discussed the use of AI scribe software for clinical note transcription with the patient and/or guardian, who gave verbal consent to proceed.  Lindsey Jensen is a 38 y.o. female presenting for a follow up visit. We last saw her in May 2024. At that time, we did not make any changes. She was doing well with Xopenex  as needed. She was introducing foods with varying levels of success.   Since last visit, she has mostly done well.    Asthma/Respiratory Symptom History: Her asthma has not  been problematic, though she experiences a dry cough upon waking, which improves with steam from the shower. She has not been using her albuterol  at all for her symptoms. She has not been on prednisone  nor has she been to the ED at all.   Allergic Rhinitis Symptom History: She has been experiencing allergic symptoms since adopting a new female dog on January 07, 2024. Initially, she noticed minor hives, which have  been a lifelong issue. Symptoms worsened after the dog was neutered on January 10, 2024, leading to watery eyes, congestion, and a dry cough. She manages these symptoms with Allegra  daily and an eye wash. She recently ran out of Flonase . Additionally, she uses a nasal spray containing a steroid and antihistamine and has a HEPA filter in her bedroom. She has a history of severe reactions to allergy  shots, managed with prednisone . She is no longer on her allergy  shot and does feel like they provided some relief of symptoms.   Food Allergy  Symptom History: She avoids peanuts, tree nuts, and shellfish due to allergies but occasionally consumes small amounts without significant issues. Dairy is consumed in moderation.  She was last seen in Hematology Clinic on November 2024. She has a history of erythrocytosis.  Stable.  He was still smoking almost a pack of cigarettes per day.  She continues to have intermittent phlebotomies.  She has never had a bone marrow biopsy she has declined.  She has had quite a lengthy list of genetic testing which has been negative.  Their plan was intermittent famotidine  as maintained on hematocrit less than 54.  She has a history of anxiety which she treats with chamomile tea.  She has occasional palpitations that resolve on their own.  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 She smokes and has not reduced her smoking habit. She travels to visit her parents once a year.   Otherwise, there have been no changes to her past medical history, surgical history, family history, or social history.    Review of systems otherwise negative other than that mentioned in the HPI.    Objective:   Blood pressure (!) 142/92, pulse (!) 101, temperature 98.1 F (36.7 C), resp. rate 18, height 5' 8 (1.727 m), weight 199 lb 8 oz (90.5 kg), SpO2 98%. Body mass index is 30.33 kg/m.    Physical Exam Vitals reviewed.  Constitutional:       Appearance: She is well-developed.     Comments: Very cold.  Talkative and smiling.  HENT:     Head: Normocephalic and atraumatic.     Right Ear: Tympanic membrane, ear canal and external ear normal.     Left Ear: Tympanic membrane, ear canal and external ear normal.     Nose: No nasal deformity, septal deviation, mucosal edema or rhinorrhea.     Right Turbinates: Enlarged, swollen and pale.     Left Turbinates: Enlarged, swollen and pale.     Right Sinus: No maxillary sinus tenderness or frontal sinus tenderness.     Left Sinus: No maxillary sinus tenderness or frontal sinus tenderness.     Comments: There is some clear rhinorrhea bilaterally.  No sinus tenderness.    Mouth/Throat:     Mouth: Mucous membranes are not pale and not dry.     Pharynx: Uvula midline.  Eyes:     General:        Right eye: No discharge.        Left eye:  No discharge.     Conjunctiva/sclera: Conjunctivae normal.     Right eye: Right conjunctiva is not injected. No chemosis.    Left eye: Left conjunctiva is not injected. No chemosis.    Pupils: Pupils are equal, round, and reactive to light.  Cardiovascular:     Rate and Rhythm: Normal rate and regular rhythm.     Heart sounds: Normal heart sounds.  Pulmonary:     Effort: Pulmonary effort is normal. No tachypnea, accessory muscle usage or respiratory distress.     Breath sounds: Normal breath sounds. No wheezing, rhonchi or rales.  Chest:     Chest wall: No tenderness.  Lymphadenopathy:     Cervical: No cervical adenopathy.  Skin:    Coloration: Skin is not pale.     Findings: No abrasion, erythema, petechiae or rash. Rash is not papular, urticarial or vesicular.  Neurological:     Mental Status: She is alert.  Psychiatric:        Behavior: Behavior is cooperative.      Diagnostic studies:    Spirometry: results normal (FEV1: 3.15/89%, FVC: 3.85/89%, FEV1/FVC: 82%).    Spirometry consistent with normal pattern.   Allergy  Studies:  none        Marty Shaggy, MD  [facility] 

## 2024-01-26 NOTE — Patient Instructions (Addendum)
 1. Perennial and seasonal allergic rhinitis (dust mites, cat, dog, roach, trees, ragweed) - We can get the blood work to see if you are allergic to the Can f 5 (present in the prostate and reproductive system of female dogs).  - This may not change anything at all, but it would be interesting to know.  - Continue Allegra  180mg  once a day for a runny nose or itch. - Sample of Ryaltris provided (contains a nasal steroid and nasal antihistamine). - We can send this into the mail order pharmacy and they will call to deliver it.  - Continue saline nasal rinses, to be used before fluticasone .    2. Intermittent asthma, uncomplicated  - Spiro looks great today.  - Daily controller medication(s): NONE - Prior to physical activity: Xopenex  2 puffs 10-15 minutes before physical activity. - Rescue medications: Xopenex  4 puffs every 4-6 hours as needed - Asthma control goals:  * Full participation in all desired activities (may need albuterol  before activity) * Albuterol  use two time or less a week on average (not counting use with activity) * Cough interfering with sleep two time or less a month * Oral steroids no more than once a year * No hospitalizations  3. Multiple food allergies (nuts, red meat, shellfish) - Continue to avoid all of your triggering foods. - Can can send in an EpiPen  if you would like.   4. Return in about 1 year (around 01/25/2025). You can have the follow up appointment with Dr. Iva or a Nurse Practicioner (our Nurse Practitioners are excellent and always have Physician oversight!).    Please inform us  of any Emergency Department visits, hospitalizations, or changes in symptoms. Call us  before going to the ED for breathing or allergy  symptoms since we might be able to fit you in for a sick visit. Feel free to contact us  anytime with any questions, problems, or concerns.  It was a pleasure to see you again today!  Websites that have reliable patient information: 1.  American Academy of Asthma, Allergy , and Immunology: www.aaaai.org 2. Food Allergy  Research and Education (FARE): foodallergy.org 3. Mothers of Asthmatics: http://www.asthmacommunitynetwork.org 4. American College of Allergy , Asthma, and Immunology: www.acaai.org      "Like" us  on Facebook and Instagram for our latest updates!      A healthy democracy works best when [employer] participate! Make sure you are registered to vote! If you have moved or changed any of your contact information, you will need to get this updated before voting! Scan the QR codes below to learn more!

## 2024-01-28 ENCOUNTER — Encounter: Payer: Self-pay | Admitting: Allergy & Immunology

## 2024-03-02 ENCOUNTER — Other Ambulatory Visit: Payer: Self-pay

## 2024-03-02 DIAGNOSIS — D751 Secondary polycythemia: Secondary | ICD-10-CM

## 2024-03-03 ENCOUNTER — Inpatient Hospital Stay: Payer: BC Managed Care – PPO

## 2024-03-03 ENCOUNTER — Inpatient Hospital Stay: Payer: BC Managed Care – PPO | Attending: Hematology

## 2024-03-03 DIAGNOSIS — D751 Secondary polycythemia: Secondary | ICD-10-CM | POA: Diagnosis not present

## 2024-03-03 LAB — CBC WITH DIFFERENTIAL/PLATELET
Abs Immature Granulocytes: 0.02 10*3/uL (ref 0.00–0.07)
Basophils Absolute: 0.1 10*3/uL (ref 0.0–0.1)
Basophils Relative: 1 %
Eosinophils Absolute: 0.1 10*3/uL (ref 0.0–0.5)
Eosinophils Relative: 1 %
HCT: 52.5 % — ABNORMAL HIGH (ref 36.0–46.0)
Hemoglobin: 17.1 g/dL — ABNORMAL HIGH (ref 12.0–15.0)
Immature Granulocytes: 0 %
Lymphocytes Relative: 23 %
Lymphs Abs: 2 10*3/uL (ref 0.7–4.0)
MCH: 30.9 pg (ref 26.0–34.0)
MCHC: 32.6 g/dL (ref 30.0–36.0)
MCV: 94.8 fL (ref 80.0–100.0)
Monocytes Absolute: 0.7 10*3/uL (ref 0.1–1.0)
Monocytes Relative: 7 %
Neutro Abs: 6 10*3/uL (ref 1.7–7.7)
Neutrophils Relative %: 68 %
Platelets: 179 10*3/uL (ref 150–400)
RBC: 5.54 MIL/uL — ABNORMAL HIGH (ref 3.87–5.11)
RDW: 16.4 % — ABNORMAL HIGH (ref 11.5–15.5)
WBC: 8.9 10*3/uL (ref 4.0–10.5)
nRBC: 0 % (ref 0.0–0.2)

## 2024-03-03 NOTE — Patient Instructions (Signed)

## 2024-03-03 NOTE — Progress Notes (Signed)
 Patient presents today for therapeutic phlebotomy.  Hematocrit today is 52.5. Patient is in satisfactory condition with no new complaints voiced.  Vital signs are stable. Durenda Hurt, NP made aware that patient did not meet parameters per her last note, but patient did want the phlebotomy today.  NP is agreeable with continuing with phlebotomy.   Therapeutic phlebotomy started at 1309 and ended at 1315.  500 mL of blood removed from L arm.  Vital signs remained stable.  Patient denies dizziness or lightheadedness.  Coban and gauze applied to site. Patient refused to wait the recommended 30 minute post phlebotomy wait time.  Patient left ambulatory with significant other in stable condition.

## 2024-04-28 ENCOUNTER — Ambulatory Visit: Payer: BC Managed Care – PPO | Admitting: Allergy & Immunology

## 2024-05-02 ENCOUNTER — Inpatient Hospital Stay: Attending: Hematology

## 2024-05-03 ENCOUNTER — Other Ambulatory Visit: Payer: Self-pay

## 2024-05-03 ENCOUNTER — Inpatient Hospital Stay: Attending: Hematology

## 2024-05-03 DIAGNOSIS — D751 Secondary polycythemia: Secondary | ICD-10-CM | POA: Insufficient documentation

## 2024-05-03 DIAGNOSIS — D582 Other hemoglobinopathies: Secondary | ICD-10-CM

## 2024-05-03 DIAGNOSIS — D509 Iron deficiency anemia, unspecified: Secondary | ICD-10-CM

## 2024-05-05 ENCOUNTER — Inpatient Hospital Stay: Payer: BC Managed Care – PPO | Admitting: Oncology

## 2024-05-05 ENCOUNTER — Inpatient Hospital Stay: Payer: BC Managed Care – PPO

## 2024-05-08 ENCOUNTER — Inpatient Hospital Stay

## 2024-05-08 DIAGNOSIS — D582 Other hemoglobinopathies: Secondary | ICD-10-CM

## 2024-05-08 DIAGNOSIS — D751 Secondary polycythemia: Secondary | ICD-10-CM | POA: Diagnosis not present

## 2024-05-08 DIAGNOSIS — D509 Iron deficiency anemia, unspecified: Secondary | ICD-10-CM

## 2024-05-08 LAB — CBC WITH DIFFERENTIAL/PLATELET
Abs Immature Granulocytes: 0.02 10*3/uL (ref 0.00–0.07)
Basophils Absolute: 0.1 10*3/uL (ref 0.0–0.1)
Basophils Relative: 1 %
Eosinophils Absolute: 0.3 10*3/uL (ref 0.0–0.5)
Eosinophils Relative: 3 %
HCT: 51.8 % — ABNORMAL HIGH (ref 36.0–46.0)
Hemoglobin: 16.6 g/dL — ABNORMAL HIGH (ref 12.0–15.0)
Immature Granulocytes: 0 %
Lymphocytes Relative: 28 %
Lymphs Abs: 2.1 10*3/uL (ref 0.7–4.0)
MCH: 30.4 pg (ref 26.0–34.0)
MCHC: 32 g/dL (ref 30.0–36.0)
MCV: 94.9 fL (ref 80.0–100.0)
Monocytes Absolute: 0.7 10*3/uL (ref 0.1–1.0)
Monocytes Relative: 9 %
Neutro Abs: 4.6 10*3/uL (ref 1.7–7.7)
Neutrophils Relative %: 59 %
Platelets: 192 10*3/uL (ref 150–400)
RBC: 5.46 MIL/uL — ABNORMAL HIGH (ref 3.87–5.11)
RDW: 16.6 % — ABNORMAL HIGH (ref 11.5–15.5)
WBC: 7.7 10*3/uL (ref 4.0–10.5)
nRBC: 0 % (ref 0.0–0.2)

## 2024-05-11 ENCOUNTER — Inpatient Hospital Stay

## 2024-05-11 DIAGNOSIS — D751 Secondary polycythemia: Secondary | ICD-10-CM | POA: Diagnosis not present

## 2024-05-11 NOTE — Patient Instructions (Signed)
 [facility] [place] - A DEPT OF [facility]  Discharge Instructions: Thank you for choosing [facility] to provide your oncology and hematology care.  If you have a lab appointment with the Cancer Center - please note that after April 8th, 2024, all labs will be drawn in the cancer center.  You do not have to check in or register with the main entrance as you have in the past but will complete your check-in in the cancer center.  Wear comfortable clothing and clothing appropriate for easy access to any Portacath or PICC line.   We strive to give you quality time with your provider. You may need to reschedule your appointment if you arrive late (15 or more minutes).  Arriving late affects you and other patients whose appointments are after yours.  Also, if you miss three or more appointments without notifying the office, you may be dismissed from the clinic at the provider's discretion.      For prescription refill requests, have your pharmacy contact our office and allow 72 hours for refills to be completed.    Today you received the following Therapeutic Phlebotomy.  Therapeutic Phlebotomy Therapeutic phlebotomy is the planned removal of blood from a person's body for the purpose of treating a medical condition. The procedure is lot like donating blood. Usually, about a pint (470 mL, or 0.47 L) of blood is removed. The average adult has 9-12 pints (4.3-5.7 L) of blood in his or her body. Therapeutic phlebotomy may be used to treat the following medical conditions: Hemochromatosis. This is a condition in which the blood contains too much iron. Polycythemia vera. This is a condition in which the blood contains too many red blood cells. Porphyria cutanea tarda. This is a disease in which an important part of hemoglobin is not made properly. It results in the buildup of abnormal amounts of porphyrins in the body. Sickle cell disease. This is a condition in which  the red blood cells form an abnormal crescent shape rather than a round shape. Tell a health care provider about: Any allergies you have. All medicines you are taking, including vitamins, herbs, eye drops, creams, and over-the-counter medicines. Any bleeding problems you have. Any surgeries you have had. Any medical conditions you have. Whether you are pregnant or may be pregnant. What are the risks? Generally, this is a safe procedure. However, problems may occur, including: Nausea or light-headedness. Low blood pressure (hypotension). Soreness, bleeding, swelling, or bruising at the needle insertion site. Infection. What happens before the procedure? Ask your health care provider about: Changing or stopping your regular medicines. This is especially important if you are taking diabetes medicines or blood thinners. Taking medicines such as aspirin and ibuprofen . These medicines can thin your blood. Do not take these medicines unless your health care provider tells you to take them. Taking over-the-counter medicines, vitamins, herbs, and supplements. Wear clothing with sleeves that can be raised above the elbow. You may have a blood sample taken. Your blood pressure, pulse rate, and breathing rate will be measured. What happens during the procedure?  You may be given a medicine to numb the area (local anesthetic). A tourniquet will be placed on your arm. A needle will be put into one of your veins. Tubing and a collection bag will be attached to the needle. Blood will flow through the needle and tubing into the collection bag. The collection bag will be placed lower than your arm so  gravity can help the blood flow into the bag. You may be asked to open and close your hand slowly and continually during the entire collection. After the specified amount of blood has been removed from your body, the collection bag and tubing will be clamped. The needle will be removed from your  vein. Pressure will be held on the needle site to stop the bleeding. A bandage (dressing) will be placed over the needle insertion site. The procedure may vary among health care providers and hospitals. What happens after the procedure? Your blood pressure, pulse rate, and breathing rate will be measured after the procedure. You will be encouraged to drink fluids. You will be encouraged to eat a snack to prevent a low blood sugar level. Your recovery will be assessed and monitored. Return to your normal activities as told by your health care provider. Summary Therapeutic phlebotomy is the planned removal of blood from a person's body for the purpose of treating a medical condition. Therapeutic phlebotomy may be used to treat hemochromatosis, polycythemia vera, porphyria cutanea tarda, or sickle cell disease. In the procedure, a needle is inserted and about a pint (470 mL, or 0.47 L) of blood is removed. The average adult has 9-12 pints (4.3-5.7 L) of blood in the body. This is generally a safe procedure, but it can sometimes cause problems such as nausea, light-headedness, or low blood pressure (hypotension). This information is not intended to replace advice given to you by your health care provider. Make sure you discuss any questions you have with your health care provider. Document Revised: 06/04/2021 Document Reviewed: 06/04/2021 Elsevier Patient Education  2024 Elsevier Inc.     To help prevent nausea and vomiting after your treatment, we encourage you to take your nausea medication as directed.  BELOW ARE SYMPTOMS THAT SHOULD BE REPORTED IMMEDIATELY: *FEVER GREATER THAN 100.4 F (38 C) OR HIGHER *CHILLS OR SWEATING *NAUSEA AND VOMITING THAT IS NOT CONTROLLED WITH YOUR NAUSEA MEDICATION *UNUSUAL SHORTNESS OF BREATH *UNUSUAL BRUISING OR BLEEDING *URINARY PROBLEMS (pain or burning when urinating, or frequent urination) *BOWEL PROBLEMS (unusual diarrhea, constipation, pain near the  anus) TENDERNESS IN MOUTH AND THROAT WITH OR WITHOUT PRESENCE OF ULCERS (sore throat, sores in mouth, or a toothache) UNUSUAL RASH, SWELLING OR PAIN  UNUSUAL VAGINAL DISCHARGE OR ITCHING   Items with * indicate a potential emergency and should be followed up as soon as possible or go to the Emergency Department if any problems should occur.  Please show the CHEMOTHERAPY ALERT CARD or IMMUNOTHERAPY ALERT CARD at check-in to the Emergency Department and triage nurse.  Should you have questions after your visit or need to cancel or reschedule your appointment, please contact [facility] [place] - A DEPT OF [facility] [phone]  and follow the prompts.  Office hours are 8:00 a.m. to 4:30 p.m. Monday - Friday. Please note that voicemails left after 4:00 p.m. may not be returned until the following business day.  We are closed weekends and major holidays. You have access to a nurse at all times for urgent questions. Please call the main number to the clinic [phone] and follow the prompts.  For any non-urgent questions, you may also contact your provider using MyChart. We now offer e-Visits for anyone 20 and older to request care online for non-urgent symptoms. For details visit [email or web address].   Also download the MyChart app! Go to the app store, search "MyChart", open the app, select [place], and log in  with your MyChart username and password.

## 2024-05-11 NOTE — Progress Notes (Signed)
 Lindsey Jensen presents today for theraputic phlebotomy per MD orders. Last hgb/hct on 05/08/2024  was 16.6/51.8 .VSS prior to procedure. Pt reports eating before arrival. Procedure started at 1508 using patients left  AC. 500 grams of blood removed. Procedure ended at 1313. Gauze and coban applied to [facility], site clean and dry. VSS upon completion of procedure. Pt denies dizziness, lightheadedness, or feeling faint. Observed for 30 minutes post procedure. Discharged in satisfactory condition with follow up instructions.

## 2024-05-12 ENCOUNTER — Inpatient Hospital Stay: Payer: BC Managed Care – PPO

## 2024-05-12 ENCOUNTER — Inpatient Hospital Stay: Admitting: Oncology

## 2024-05-12 ENCOUNTER — Inpatient Hospital Stay: Payer: BC Managed Care – PPO | Admitting: Oncology

## 2024-05-12 DIAGNOSIS — D751 Secondary polycythemia: Secondary | ICD-10-CM | POA: Diagnosis not present

## 2024-05-12 DIAGNOSIS — Z720 Tobacco use: Secondary | ICD-10-CM

## 2024-05-12 DIAGNOSIS — D582 Other hemoglobinopathies: Secondary | ICD-10-CM

## 2024-05-12 DIAGNOSIS — F17210 Nicotine dependence, cigarettes, uncomplicated: Secondary | ICD-10-CM | POA: Diagnosis not present

## 2024-05-12 DIAGNOSIS — E611 Iron deficiency: Secondary | ICD-10-CM

## 2024-05-12 DIAGNOSIS — D509 Iron deficiency anemia, unspecified: Secondary | ICD-10-CM

## 2024-05-12 NOTE — Progress Notes (Unsigned)
 [name] [address], [place] [address]   CLINIC:  Medical Oncology/Hematology  PCP:  Meldon Sport, MD [address] [place] [address] [phone]  REASON FOR VISIT:  Follow-up for erythrocytosis  CURRENT THERAPY: Intermittent phlebotomy  I connected with Lindsey Jensen on 05/12/24 at  1:30 PM EDT by telephone visit and verified that I am speaking with the correct person using two identifiers.   I discussed the limitations, risks, security and privacy concerns of performing an evaluation and management service by telemedicine and the availability of in-person appointments. I also discussed with the patient that there may be a patient responsible charge related to this service. The patient expressed understanding and agreed to proceed.   Other persons participating in the visit and their role in the encounter: NP. Patient     Patient's location: Home  Provider's location: Clinic    INTERVAL HISTORY:   Lindsey Jensen 38 y.o. female returns for routine follow-up of erythrocytosis.  She was last seen by me on   She underwent therapeutic phlebotomy approximately every 3 to 4 months.  Her last phlebotomy was on 05/11/24.  She tolerates phlebotomies well except for some anxiety.  Reports she has high blood pressure and having phlebotomies makes her anxious.  At today's visit, she reports feeling stable.  She was last seen by me on 11/11/2023.   Reports she smokes on average 0.75 packs/day but occasionally will smoke less and occasionally will smoke more based on situational circumstances. She denies the use of diuretics or hormone supplements.  Denies history of sleep apnea and does not believe she snores.  She denies any carbon monoxide exposure or underlying cardiopulmonary disease. She does not have any other cardiac risk factors such as hypertension, hyperlipidemia, or diabetes.  No family history of MPN.  She has had intermittent erythromelalgia (hands turn  red with burning pain), which appears to improve after phlebotomies.  Has occasional dizziness and headaches but feels like it is due to looking at the computer screen for too long, which is improved after phlebotomy.  She reports rare tinnitus and occasional blurry vision from "looking at a computer screen too long."  She denies any dizziness, strokelike symptoms, neuropathy, B symptoms, abdominal pain, nausea, early satiety.  Reports she has been drinking 2 to 3 glasses of chamomile tea each day to help with her anxiety.  Has occasional palpitations they are self-limiting.  Appetite is 100% energy levels are 75%.  She denies any pain.  Weight is stable.  She denies any recent hospitalizations, surgeries or changes to her baseline health.  ASSESSMENT & PLAN:  1.  Erythrocytosis - Intermittently elevated hemoglobin since 2014, with persistent elevations since October 2019 - Hematology workup revealed NEGATIVE mutational workup for JAK2, CALR, and MPL.  She had elevated carboxyhemoglobin at 4.2.  Erythropoietin  was 12.7 (normal). - Echo on 03/05/2020 shows EF 60 to 65% with normal function.  Atria were normal. - Ultrasound of the abdomen on 04/05/2020 did not show any cystic lesions in the liver or kidneys.  Spleen size was normal. - Current everyday smoker, 0.75 PPD cigarettes - No diuretics, hormone supplements, cardiopulmonary disease, or carbon monoxide exposure.  No family history of MPN. - No history of sleep apnea, but reports to mild snoring, not feeling rested in the mornings, and feeling sleepy throughout the day - Symptomatic with intermittent erythromelalgia, rare tinnitus and occasional blurry vision.  No B symptoms or symptoms of splenomegaly. - No  history of DVT or PE. - She has been receiving therapeutic phlebotomy every 2-3 months (most recently on 05/11/24) for symptom relief.   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  Unable to rule out bone  marrow disorder at this time, as patient has declined bone marrow biopsy in the past. - Discussed with patient that primary treatment of secondary throat cytosis is aimed at causative factor, in this case smoking cessation.  2.  Iron deficiency - No rectal bleeding or melena.  Periods are "normal." - She has been receiving periodic phlebotomy for her erythrocytosis. - Ferritin was 10 (10/05/2022) with 10% iron saturation/TIBC 549 (11/08/2020) - Etiology of iron deficiency is most likely regular blood loss (therapeutic phlebotomy, menstrual bleeding) and increased iron demand (overproduction of RBCs, which is seen in both primary and secondary polycythemia).   3.  Tobacco use: - She is continuing to smoke 0.75 PPD cigarettes per day.  PLAN: 1. Erythrocytosis - Would recommend therapeutic phlebotomy to maintain HCT <54.0 or for treatment of vasomotor symptoms. - Most recent labs from 05/08/2024 show a hemoglobin of 16.16/hematocrit 51.8 with normal platelets 192 and differential.  WBC is WNL. -Labs from 01/07/2024 showed carboxyhemoglobin 5.1, elevated carbon monoxide 7.8 and elevated erythropoietin  93.6 (12.7). - Continue CBC + phlebotomy every 8 weeks.  RTC in 6 months. - No indication for aspirin. - She continues to smoke cigarettes and is not interested in quitting at this time. -Discussed sleep study if numbers continually trend up or if she continues to have elevated hemoglobin 3 to 4 months after smoking   2. Iron deficiency anemia, unspecified iron deficiency anemia type - Administering iron to patient with secondary erythrocytosis who also has iron deficiency due to increased demand for RBC production does not inherently make erythrocytosis worse, but careful monitoring is key.  Treatment of iron deficiency in patients with secondary erythrocytosis seems to replenish iron stores safely, with transferrin saturation less than 20% below treatment threshold. - Iron levels from 01/07/2024 show  a ferritin of 8 with iron saturations of 10%.  TIBC is elevated at 575.  We discussed 1 dose IV iron.   3. Tobacco abuse -She is not interested to quit smoking at this time.  PLAN SUMMARY: >> CBC + Phlebotomy every 8 weeks >> Replace iron either orally or IV based on patient preference.  Awaiting her response. >> Labs in 4-6 months = CBC/D, CMP, LDH, ferritin, iron/TIBC >> RTC in      REVIEW OF SYSTEMS:   Review of Systems  Constitutional:  Negative for appetite change, chills, diaphoresis, fatigue, fever and unexpected weight change.  HENT:   Negative for lump/mass and nosebleeds.   Eyes:  Negative for eye problems.  Respiratory:  Negative for cough, hemoptysis and shortness of breath.   Cardiovascular:  Negative for chest pain, leg swelling and palpitations.  Gastrointestinal:  Negative for abdominal pain, blood in stool, constipation, diarrhea, nausea and vomiting.  Genitourinary:  Negative for hematuria.   Skin: Negative.   Neurological:  Negative for dizziness, headaches and light-headedness.  Hematological:  Does not bruise/bleed easily.  Psychiatric/Behavioral:  The patient is nervous/anxious.      PHYSICAL EXAM:  ECOG PERFORMANCE STATUS: 0 - Asymptomatic  There were no vitals filed for this visit.  There were no vitals filed for this visit.  Physical Exam Constitutional:      Appearance: Normal appearance.  Cardiovascular:     Heart sounds: Normal heart sounds.  Pulmonary:  Breath sounds: Normal breath sounds.  Neurological:     General: No focal deficit present.     Mental Status: Mental status is at baseline.  Psychiatric:        Behavior: Behavior normal. Behavior is cooperative.    PAST MEDICAL/SURGICAL HISTORY:  Past Medical History:  Diagnosis Date  . Allergy     Phreesia 05/26/2020  . Anxiety    Phreesia 05/26/2020  . ASCUS of cervix with negative high risk HPV 07/29/2021   07/28/21 repeat in 3 years per [facility], 5 year risk for CIN3+ is 0.40%  .  Asthma    Phreesia 05/26/2020  . Contraceptive education 01/26/2014  . Erythrocytosis   . Medical history non-contributory   . Moderate persistent asthma, uncomplicated 05/19/2019  . Multiple allergies 06/11/2020   Past Surgical History:  Procedure Laterality Date  . NO PAST SURGERIES      SOCIAL HISTORY:  Social History   Socioeconomic History  . Marital status: Married    Spouse name: Not on file  . Number of children: 1  . Years of education: Not on file  . Highest education level: Bachelor's degree (e.g., BA, AB, BS)  Occupational History    Employer: Airline pilot  Tobacco Use  . Smoking status: Every Day    Current packs/day: 0.50    Average packs/day: 0.5 packs/day for 14.0 years (7.0 ttl pk-yrs)    Types: Cigarettes  . Smokeless tobacco: Never  Vaping Use  . Vaping status: Never Used  Substance and Sexual Activity  . Alcohol use: Yes    Alcohol/week: 14.0 standard drinks of alcohol    Types: 14 Glasses of wine per week    Comment: glass of wine every night    . Drug use: No  . Sexual activity: Yes    Birth control/protection: Other-see comments    Comment: partner has vasectomy  Other Topics Concern  . Not on file  Social History Narrative   Lives with husband and son      Dog: Pitbull: Lana       Enjoys: reading-reformed theology; fiction; drink wine, buy shoes, playing cards      Diet: eats all food groups outside of meat-some chicken at times   Caffeine: 3 cups of expresso daily    Water: 24 oz x 3 bottles daily       Wears seat belt   Does not use phone while driving   Multimedia programmer in a lock box   Social Drivers of Health   Financial Resource Strain: Low Risk  (07/21/2021)   Overall Financial Resource Strain (CARDIA)   . Difficulty of Paying Living Expenses: Not very hard  Food Insecurity: No Food Insecurity (07/21/2021)   Hunger Vital Sign   . Worried About Programme researcher, broadcasting/film/video in the Last Year: Never true    . Ran Out of Food in the Last Year: Never true  Transportation Needs: No Transportation Needs (07/21/2021)   PRAPARE - Transportation   . Lack of Transportation (Medical): No   . Lack of Transportation (Non-Medical): No  Physical Activity: Insufficiently Active (07/21/2021)   Exercise Vital Sign   . Days of Exercise per Week: 3 days   . Minutes of Exercise per Session: 30 min  Stress: Stress Concern Present (07/21/2021)   [employer] of Occupational Health - Occupational Stress Questionnaire   . Feeling of Stress : Very much  Social Connections: Moderately Isolated (07/21/2021)   Social Connection and Isolation  Panel [NHANES]   . Frequency of Communication with Friends and Family: More than three times a week   . Frequency of Social Gatherings with Friends and Family: Once a week   . Attends Religious Services: Never   . Active Member of Clubs or Organizations: No   . Attends Banker Meetings: Never   . Marital Status: Married  Catering manager Violence: Not At Risk (07/21/2021)   Humiliation, Afraid, Rape, and Kick questionnaire   . Fear of Current or Ex-Partner: No   . Emotionally Abused: No   . Physically Abused: No   . Sexually Abused: No    FAMILY HISTORY:  Family History  Problem Relation Age of Onset  . Heart disease Father        heart attack  . Allergic rhinitis Father   . Diabetes Mother   . Depression Mother   . Anxiety disorder Mother   . Heart disease Mother   . Heart disease Maternal Grandmother        CHF  . Asthma Maternal Grandmother   . Diabetes Maternal Grandmother   . High Cholesterol Sister   . Kidney Stones Brother   . Alzheimer's disease Maternal Grandfather   . Asthma Maternal Grandfather   . Diabetes Maternal Uncle   . Angioedema Neg Hx   . Immunodeficiency Neg Hx     CURRENT MEDICATIONS:  Outpatient Encounter Medications as of 05/12/2024  Medication Sig  . azelastine  (ASTELIN ) 0.1 % nasal spray Place 2 sprays into both  nostrils 2 (two) times daily.  . Bepotastine  Besilate 1.5 % SOLN Place 2 drops into both eyes 2 (two) times daily as needed.  . clindamycin  (CLINDAGEL) 1 % gel APPLY TO AFFECTED AREA TWICE A DAY  . EPINEPHrine  (AUVI-Q ) 0.3 mg/0.3 mL IJ SOAJ injection Inject 0.3 mg into the muscle as needed for anaphylaxis.  . fexofenadine  (ALLEGRA ) 180 MG tablet Take 1 tablet (180 mg total) by mouth daily.  . fluticasone  (FLONASE ) 50 MCG/ACT nasal spray SPRAY 2 SPRAYS INTO EACH NOSTRIL EVERY DAY  . levalbuterol  (XOPENEX  HFA) 45 MCG/ACT inhaler TAKE 2 PUFFS BY MOUTH EVERY 6 HOURS AS NEEDED FOR WHEEZE OR SHORTNESS OF BREATH   No facility-administered encounter medications on file as of 05/12/2024.    ALLERGIES:  Allergies  Allergen Reactions  . Beef-Derived Drug Products Anaphylaxis    Alpha-Gal Allergy    . Meat [Alpha-Gal] Palpitations    All mammal. Symptom asthma .  Aaron Aas Pork-Derived Products Anaphylaxis    Alpha-Gal Allergy   . Shellfish Allergy  Other (See Comments)    SOB  . Other Rash and Itching    LABORATORY DATA:  I have reviewed the labs as listed.  CBC    Component Value Date/Time   WBC 7.7 05/08/2024 1427   RBC 5.46 (H) 05/08/2024 1427   HGB 16.6 (H) 05/08/2024 1427   HGB 20.1 (HH) 04/29/2021 0850   HCT 51.8 (H) 05/08/2024 1427   HCT 58.5 (H) 04/29/2021 0850   PLT 192 05/08/2024 1427   PLT 137 (L) 04/29/2021 0850   MCV 94.9 05/08/2024 1427   MCV 95 04/29/2021 0850   MCH 30.4 05/08/2024 1427   MCHC 32.0 05/08/2024 1427   RDW 16.6 (H) 05/08/2024 1427   RDW 14.2 04/29/2021 0850   LYMPHSABS 2.1 05/08/2024 1427   LYMPHSABS 1.6 04/29/2021 0850   MONOABS 0.7 05/08/2024 1427   EOSABS 0.3 05/08/2024 1427   EOSABS 0.2 04/29/2021 0850   BASOSABS 0.1 05/08/2024 1427   BASOSABS 0.1 04/29/2021  1610      Latest Ref Rng & Units 01/07/2024   12:29 PM 04/02/2022    3:28 PM 10/02/2021    3:29 PM  CMP  Glucose 70 - 99 mg/dL 95  98  94   BUN 6 - 20 mg/dL 13  14  11    Creatinine 0.44 - 1.00  mg/dL 9.60  4.54  0.98   Sodium 135 - 145 mmol/L 133  136  134   Potassium 3.5 - 5.1 mmol/L 4.1  4.2  4.3   Chloride 98 - 111 mmol/L 100  104  101   CO2 22 - 32 mmol/L 26  27  26    Calcium 8.9 - 10.3 mg/dL 8.6  8.9  8.8   Total Protein 6.5 - 8.1 g/dL 7.0  7.3  7.5   Total Bilirubin 0.0 - 1.2 mg/dL 0.6  0.7  1.0   Alkaline Phos 38 - 126 U/L 61  66  76   AST 15 - 41 U/L 15  16  20    ALT 0 - 44 U/L 16  16  17      DIAGNOSTIC IMAGING:  I have independently reviewed the relevant imaging and discussed with the patient.   WRAP UP:  All questions were answered. The patient knows to call the clinic with any problems, questions or concerns.  Medical decision making: Moderate  Time spent on visit: I spent 25 minutes counseling the patient face to face. The total time spent in the appointment was 40 minutes and more than 50% was on counseling.  Aurther Blue, NP  05/13/2023 1:27 PM

## 2024-05-13 ENCOUNTER — Encounter: Payer: Self-pay | Admitting: Hematology

## 2024-05-13 ENCOUNTER — Encounter: Payer: Self-pay | Admitting: Oncology

## 2024-05-13 DIAGNOSIS — E611 Iron deficiency: Secondary | ICD-10-CM | POA: Insufficient documentation

## 2024-05-19 ENCOUNTER — Encounter: Payer: Self-pay | Admitting: Hematology

## 2024-05-23 ENCOUNTER — Encounter: Payer: Self-pay | Admitting: Professional

## 2024-05-23 ENCOUNTER — Ambulatory Visit (INDEPENDENT_AMBULATORY_CARE_PROVIDER_SITE_OTHER): Admitting: Professional

## 2024-05-23 DIAGNOSIS — F411 Generalized anxiety disorder: Secondary | ICD-10-CM

## 2024-05-23 NOTE — Progress Notes (Signed)
 [facility] Counselor/Therapist Progress Note  Patient ID: Lindsey Jensen, MRN: [id number],    Date: 05/23/2024  Time Spent: 60 minutes 905-1005am  Treatment Type: Individual Therapy  Risk Assessment: Danger to Self:  No Self-injurious Behavior: No Danger to Others: No  Subjective: This session was held via video teletherapy. The patient consented to video teletherapy and was located in her home during this session. She is aware it is the responsibility of the patient to secure confidentiality on her end of the session. The provider was in a private home office for the duration of this session.    The patient arrived on time for her Caregility appointment.  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 Enhance ability to effectively cope with the full variety of life's worries and anxieties. 2. Learn and implement coping skills that result in a reduction of anxiety and worry, and improved daily functioning. 3. Reduce overall frequency, intensity, and duration of the anxiety so that daily functioning is not impaired. Objective Learn and implement calming skills to reduce overall anxiety and manage anxiety symptoms. Target Date: 2025-05-23 Frequency: Biweekly  Progress: 0 Modality: individual  Related  Interventions Teach the client calming/relaxation skills (e.g., applied relaxation, progressive muscle relaxation, cue controlled relaxation; mindful breathing; biofeedback) and how to discriminate better between relaxation and tension; teach the client how to apply these skills to his/her daily life (e.g., New Directions in Progressive Muscle Relaxation by Fara Hone, and Hazlett-Stevens; Treating Generalized Anxiety Disorder by Rygh and Joya Nissen). Assign the client homework each session in which he/she practices relaxation exercises daily, gradually applying them progressively from non-anxiety-provoking to anxiety-provoking situations; review and reinforce success while providing corrective feedback toward improvement. Assign the client to read about progressive muscle relaxation and other calming strategies in relevant books or treatment manuals (e.g., Progressive Relaxation Training by Rodolfo Clan and Arvil Birks; Mastery of Your Anxiety and Worry: Workbook by Rodney Clamp). Objective Identify, challenge, and replace biased, fearful self-talk with positive, realistic, and empowering self-talk. Target Date: 2025-05-23 Frequency: Biweekly  Progress: 0 Modality: individual  Related Interventions Explore the client's schema and self-talk that mediate his/her fear response; assist him/her in challenging the biases; replace the distorted messages with reality-based alternatives and positive, realistic self-talk that will increase his/her self-confidence in coping with irrational fears (see Cognitive Therapy of Anxiety Disorders by Anderson Kaufman). Assign the client a homework exercise in which he/she identifies fearful self-talk, identifies biases in the self-talk, generates alternatives, and tests through behavioral experiments (or assign "Negative Thoughts Trigger Negative Feelings" in the Adult Psychotherapy Homework Planner by [facility]); review and reinforce success, providing corrective  feedback toward improvement. Objective Undergo gradual repeated imaginal exposure to the feared negative consequences predicted by worries and develop alternative reality-based predictions. Target Date: 2025-05-23 Frequency: Biweekly  Progress: 0 Modality: individual  Related Interventions Direct and assist the client  in constructing a hierarchy of two to three spheres of worry for use in exposure (e.g., worry about harm to others, financial difficulties, relationship problems). Ask the client to vividly imagine worst-case consequences of worries, holding them in mind until anxiety associated with them weakens (up to 30 minutes); generate reality-based alternatives to that worst case and process them (see Mastery of Your Anxiety and Worry: Therapist Guide by Stasia Edelman). Assign the client a homework exercise in which he/she does worry exposures and records responses (see Mastery of Your Anxiety and Worry: Workbook by Colbert Dates and Edna Gouty or Generalized Anxiety Disorder by Woodson He, and Edna Gouty); review, reinforce success, and provide corrective feedback toward improvement. Objective Learn and implement relapse prevention strategies for managing possible future anxiety symptoms. Target Date: 2025-05-23 Frequency: Biweekly  Progress: 0 Modality: individual  Related Interventions Discuss with the client the distinction between a lapse and relapse, associating a lapse with an initial and reversible return of worry, anxiety symptoms, or urges to avoid, and relapse with the decision to continue the fearful and avoidant patterns. Identify and rehearse with the client the management of future situations or circumstances in which lapses could occur. Instruct the client to routinely use new therapeutic skills (e.g., relaxation, cognitive restructuring, exposure, and problem-solving) in daily life to address emergent worries, anxiety, and avoidant tendencies. Develop a "coping card" on which  coping strategies and other important information (e.g., "Breathe deeply and relax," "Challenge unrealistic worries," "Use problem-solving") are written for the client's later use. Schedule periodic "maintenance" sessions to help the client maintain therapeutic gains. Objective Learn to accept limitations in life and commit to tolerating, rather than avoiding, unpleasant emotions while accomplishing meaningful goals. Target Date: 2025-05-23 Frequency: Biweekly  Progress: 0 Modality: individual  Related Interventions Use techniques from Acceptance and Commitment Therapy to help client accept uncomfortable realities such as lack of complete control, imperfections, and uncertainty and tolerate unpleasant emotions and thoughts in order to accomplish value-consistent goals. 4. Stabilize anxiety level while increasing ability to function on a daily basis.  Diagnosis:Generalized anxiety disorder  Plan:  -meet again on Tuesday, July 06, 2024 at 9am.

## 2024-06-06 ENCOUNTER — Ambulatory Visit (INDEPENDENT_AMBULATORY_CARE_PROVIDER_SITE_OTHER): Admitting: Professional

## 2024-06-06 ENCOUNTER — Encounter: Payer: Self-pay | Admitting: Professional

## 2024-06-06 DIAGNOSIS — F411 Generalized anxiety disorder: Secondary | ICD-10-CM

## 2024-06-06 NOTE — Progress Notes (Signed)
 [facility] Counselor/Therapist Progress Note  Patient ID: Lindsey Jensen, MRN: [id number],    Date: 06/06/2024  Time Spent:  45 minutes 901-946am  Treatment Type: Individual Therapy  Risk Assessment: Danger to Self:  No Self-injurious Behavior: No Danger to Others: No  Subjective: This session was held via video teletherapy. The patient consented to video teletherapy and was located in her home during this session. She is aware it is the responsibility of the patient to secure confidentiality on her end of the session. The provider was in a private home office for the duration of this session.    The patient arrived on time for her Caregility appointment.  Issues: 1-anxiety -pt back at work -she has been doing pretty good -how to deal with over-stimulation -has been ignoring the noise around her -she has been letting it be there -notices the noise in her body -she will reduce caffeine ten days prior to her period -she has started using piliates and yoga -she has been more intentional with eating on regular schedule -she admits that it has taken so much time wasted on the sensations and this is ridiculous 2-summer visit to [place] to see her family -it has been two years -she is having Enzo's birthday party there -she is nervous about the drive -it's 12 hours -people drive like maniacs -sister Shelagh Derrick spends many nights at her mother's house -she has already started preparing her mother of her boundaries 3-Enzo -pt has been more intentional to not raise her voice to gain compliance -pt talks to him more for his learning -reminded pt to not become overly emotional     Treatment Plan Problems Addressed  Anxiety Goals 1. Enhance ability to effectively cope with the full variety of life's worries and anxieties. 2. Learn and implement coping skills that result in a reduction of anxiety and worry, and improved daily functioning. 3. Reduce overall frequency,  intensity, and duration of the anxiety so that daily functioning is not impaired. Objective Learn and implement calming skills to reduce overall anxiety and manage anxiety symptoms. Target Date: 2025-05-23 Frequency: Biweekly  Progress: 0 Modality: individual  Related Interventions Teach the client calming/relaxation skills (e.g., applied relaxation, progressive muscle relaxation, cue controlled relaxation; mindful breathing; biofeedback) and how to discriminate better between relaxation and tension; teach the client how to apply these skills to his/her daily life (e.g., New Directions in Progressive Muscle Relaxation by Fara Hone, and Hazlett-Stevens; Treating Generalized Anxiety Disorder by Rygh and Joya Nissen). Assign the client homework each session in which he/she practices relaxation exercises daily, gradually applying them progressively from non-anxiety-provoking to anxiety-provoking situations; review and reinforce success while providing corrective feedback toward improvement. Assign the client to read about progressive muscle relaxation and other calming strategies in relevant books or treatment manuals (e.g., Progressive Relaxation Training by Rodolfo Clan and Arvil Birks; Mastery of Your Anxiety and Worry: Workbook by Rodney Clamp). Objective Identify, challenge, and replace biased, fearful self-talk with positive, realistic, and empowering self-talk. Target Date: 2025-05-23 Frequency: Biweekly  Progress: 0 Modality: individual  Related Interventions Explore the client's schema and self-talk that mediate his/her fear response; assist him/her in challenging the biases; replace the distorted messages with reality-based alternatives and positive, realistic self-talk that will increase his/her self-confidence in coping with irrational fears (see Cognitive Therapy of Anxiety Disorders by Anderson Kaufman). Assign the client a homework exercise in which he/she identifies fearful self-talk,  identifies biases in the self-talk, generates alternatives, and tests through behavioral experiments (or assign Negative Thoughts  Trigger Negative Feelings in the Adult Psychotherapy Homework Planner by [facility]); review and reinforce success, providing corrective feedback toward improvement. Objective Undergo gradual repeated imaginal exposure to the feared negative consequences predicted by worries and develop alternative reality-based predictions. Target Date: 2025-05-23 Frequency: Biweekly  Progress: 0 Modality: individual  Related Interventions Direct and assist the client in constructing a hierarchy of two to three spheres of worry for use in exposure (e.g., worry about harm to others, financial difficulties, relationship problems). Ask the client to vividly imagine worst-case consequences of worries, holding them in mind until anxiety associated with them weakens (up to 30 minutes); generate reality-based alternatives to that worst case and process them (see Mastery of Your Anxiety and Worry: Therapist Guide by Stasia Edelman). Assign the client a homework exercise in which he/she does worry exposures and records responses (see Mastery of Your Anxiety and Worry: Workbook by Colbert Dates and Edna Gouty or Generalized Anxiety Disorder by Woodson He, and Edna Gouty); review, reinforce success, and provide corrective feedback toward improvement. Objective Learn and implement relapse prevention strategies for managing possible future anxiety symptoms. Target Date: 2025-05-23 Frequency: Biweekly  Progress: 0 Modality: individual  Related Interventions Discuss with the client the distinction between a lapse and relapse, associating a lapse with an initial and reversible return of worry, anxiety symptoms, or urges to avoid, and relapse with the decision to continue the fearful and avoidant patterns. Identify and rehearse with the client the management of future situations or circumstances in which  lapses could occur. Instruct the client to routinely use new therapeutic skills (e.g., relaxation, cognitive restructuring, exposure, and problem-solving) in daily life to address emergent worries, anxiety, and avoidant tendencies. Develop a coping card on which coping strategies and other important information (e.g., Breathe deeply and relax, Challenge unrealistic worries, Use problem-solving) are written for the client's later use. Schedule periodic maintenance sessions to help the client maintain therapeutic gains. Objective Learn to accept limitations in life and commit to tolerating, rather than avoiding, unpleasant emotions while accomplishing meaningful goals. Target Date: 2025-05-23 Frequency: Biweekly  Progress: 0 Modality: individual  Related Interventions Use techniques from Acceptance and Commitment Therapy to help client accept uncomfortable realities such as lack of complete control, imperfections, and uncertainty and tolerate unpleasant emotions and thoughts in order to accomplish value-consistent goals. 4. Stabilize anxiety level while increasing ability to function on a daily basis.  Diagnosis:Generalized anxiety disorder  Plan:  -meet again on Wednesday, June 28, 2024 at 9am.

## 2024-06-28 ENCOUNTER — Ambulatory Visit (INDEPENDENT_AMBULATORY_CARE_PROVIDER_SITE_OTHER): Admitting: Professional

## 2024-06-28 ENCOUNTER — Encounter: Payer: Self-pay | Admitting: Professional

## 2024-06-28 DIAGNOSIS — F411 Generalized anxiety disorder: Secondary | ICD-10-CM

## 2024-06-28 NOTE — Progress Notes (Signed)
 [facility] Counselor/Therapist Progress Note  Patient ID: MIASHA EMMONS, MRN: [id number],    Date: 06/28/2024  Time Spent:  40 minutes 859-939am  Treatment Type: Individual Therapy  Risk Assessment: Danger to Self:  No Self-injurious Behavior: No Danger to Others: No  Subjective: This session was held via video teletherapy. The patient consented to video teletherapy and was located in her home during this session. She is aware it is the responsibility of the patient to secure confidentiality on her end of the session. The provider was in a private home office for the duration of this session.    The patient arrived on time for her Caregility appointment.  Issues: 1-trip to visit family a-on drive home husband displayed heart attack symptoms -no evidence of cardiac events after extensive testing -told Mintie he could not drive home and they called BIL who brought wife 4 hrs to drive them home -her SIL drove them home and is still here due to weather and inability to fly -pt asked him to recount the event and she concluded it was a panic attack b-she enjoyed visit -she felt good about the realization that all those years she was being critical of herself that it was not her issue, it was theirs -family highly critical of the pt and her spouse and how they raise their son -[place]ford asked her why she isn't herself when she is in [place] -importance of keeping herself focused on her family and not getting involve in [place] family drama 2-anxiety -pt has not driven in two years and realizes after what happened with [place]ford that it is imperative that she drive again -discussed increased exposure and getting comfortable in the drivers seat and riding in her neighborhood  Treatment Plan Problems Addressed  Anxiety Goals 1. Enhance ability to effectively cope with the full variety of life's worries and anxieties. 2. Learn and implement coping skills that result in a reduction of anxiety and  worry, and improved daily functioning. 3. Reduce overall frequency, intensity, and duration of the anxiety so that daily functioning is not impaired. Objective Learn and implement calming skills to reduce overall anxiety and manage anxiety symptoms. Target Date: 2025-05-23 Frequency: Biweekly  Progress: 0 Modality: individual  Related Interventions Teach the client calming/relaxation skills (e.g., applied relaxation, progressive muscle relaxation, cue controlled relaxation; mindful breathing; biofeedback) and how to discriminate better between relaxation and tension; teach the client how to apply these skills to his/her daily life (e.g., New Directions in Progressive Muscle Relaxation by Thornell Collier, and Hazlett-Stevens; Treating Generalized Anxiety Disorder by Rygh and Red). Assign the client homework each session in which he/she practices relaxation exercises daily, gradually applying them progressively from non-anxiety-provoking to anxiety-provoking situations; review and reinforce success while providing corrective feedback toward improvement. Assign the client to read about progressive muscle relaxation and other calming strategies in relevant books or treatment manuals (e.g., Progressive Relaxation Training by Thornell and Collier; Mastery of Your Anxiety and Worry: Workbook by Richarda armin Given). Objective Identify, challenge, and replace biased, fearful self-talk with positive, realistic, and empowering self-talk. Target Date: 2025-05-23 Frequency: Biweekly  Progress: 0 Modality: individual  Related Interventions Explore the client's schema and self-talk that mediate his/her fear response; assist him/her in challenging the biases; replace the distorted messages with reality-based alternatives and positive, realistic self-talk that will increase his/her self-confidence in coping with irrational fears (see Cognitive Therapy of Anxiety Disorders by Gretta armin Mon). Assign the  client a homework exercise in which he/she identifies fearful self-talk,  identifies biases in the self-talk, generates alternatives, and tests through behavioral experiments (or assign Negative Thoughts Trigger Negative Feelings in the Adult Psychotherapy Homework Planner by [facility]); review and reinforce success, providing corrective feedback toward improvement. Objective Undergo gradual repeated imaginal exposure to the feared negative consequences predicted by worries and develop alternative reality-based predictions. Target Date: 2025-05-23 Frequency: Biweekly  Progress: 0 Modality: individual  Related Interventions Direct and assist the client in constructing a hierarchy of two to three spheres of worry for use in exposure (e.g., worry about harm to others, financial difficulties, relationship problems). Ask the client to vividly imagine worst-case consequences of worries, holding them in mind until anxiety associated with them weakens (up to 30 minutes); generate reality-based alternatives to that worst case and process them (see Mastery of Your Anxiety and Worry: Therapist Guide by Venson Richarda armin Jonne). Assign the client a homework exercise in which he/she does worry exposures and records responses (see Mastery of Your Anxiety and Worry: Workbook by Richarda and Jonne or Generalized Anxiety Disorder by Delores Filler, and Jonne); review, reinforce success, and provide corrective feedback toward improvement. Objective Learn and implement relapse prevention strategies for managing possible future anxiety symptoms. Target Date: 2025-05-23 Frequency: Biweekly  Progress: 0 Modality: individual  Related Interventions Discuss with the client the distinction between a lapse and relapse, associating a lapse with an initial and reversible return of worry, anxiety symptoms, or urges to avoid, and relapse with the decision to continue the fearful and avoidant patterns. Identify and rehearse with  the client the management of future situations or circumstances in which lapses could occur. Instruct the client to routinely use new therapeutic skills (e.g., relaxation, cognitive restructuring, exposure, and problem-solving) in daily life to address emergent worries, anxiety, and avoidant tendencies. Develop a coping card on which coping strategies and other important information (e.g., Breathe deeply and relax, Challenge unrealistic worries, Use problem-solving) are written for the client's later use. Schedule periodic maintenance sessions to help the client maintain therapeutic gains. Objective Learn to accept limitations in life and commit to tolerating, rather than avoiding, unpleasant emotions while accomplishing meaningful goals. Target Date: 2025-05-23 Frequency: Biweekly  Progress: 0 Modality: individual  Related Interventions Use techniques from Acceptance and Commitment Therapy to help client accept uncomfortable realities such as lack of complete control, imperfections, and uncertainty and tolerate unpleasant emotions and thoughts in order to accomplish value-consistent goals. 4. Stabilize anxiety level while increasing ability to function on a daily basis.  Diagnosis:Generalized anxiety disorder  Plan:  -focus on driving in neighborhood -meet again on Monday, July 10, 2024 at 9am.

## 2024-07-06 DIAGNOSIS — J3089 Other allergic rhinitis: Secondary | ICD-10-CM | POA: Diagnosis not present

## 2024-07-06 DIAGNOSIS — J302 Other seasonal allergic rhinitis: Secondary | ICD-10-CM | POA: Diagnosis not present

## 2024-07-06 DIAGNOSIS — D509 Iron deficiency anemia, unspecified: Secondary | ICD-10-CM | POA: Diagnosis not present

## 2024-07-06 DIAGNOSIS — D582 Other hemoglobinopathies: Secondary | ICD-10-CM | POA: Diagnosis not present

## 2024-07-06 DIAGNOSIS — Z720 Tobacco use: Secondary | ICD-10-CM | POA: Diagnosis not present

## 2024-07-06 DIAGNOSIS — D751 Secondary polycythemia: Secondary | ICD-10-CM | POA: Diagnosis not present

## 2024-07-06 NOTE — Addendum Note (Signed)
 Addended by: Abigail Marsiglia A on: 07/06/2024 10:28 AM   Modules accepted: Orders

## 2024-07-07 ENCOUNTER — Inpatient Hospital Stay

## 2024-07-07 ENCOUNTER — Inpatient Hospital Stay: Attending: Hematology

## 2024-07-09 LAB — IGE DOG W/ COMPONENT REFLEX

## 2024-07-10 ENCOUNTER — Ambulatory Visit: Admitting: Professional

## 2024-07-11 LAB — ALLERGEN COMPONENT COMMENTS

## 2024-07-11 LAB — PANEL 606648
E101-IgE Can f 1: 1.6 kU/L — AB
E102-IgE Can f 2: 1.64 kU/L — AB
E221-IgE Can f 3: >100 kU/L — AB
E226-IgE Can f 5: <0.1 kU/L

## 2024-07-11 LAB — IGE DOG W/ COMPONENT REFLEX: E005-IgE Dog Dander: 46.6 kU/L — AB

## 2024-07-14 ENCOUNTER — Inpatient Hospital Stay: Attending: Hematology

## 2024-07-14 DIAGNOSIS — D751 Secondary polycythemia: Secondary | ICD-10-CM | POA: Diagnosis not present

## 2024-07-14 NOTE — Patient Instructions (Signed)
 [facility] [place] - A DEPT OF . [facility]  Discharge Instructions: Thank you for choosing [facility] to provide your oncology and hematology care.  If you have a lab appointment with the Cancer Center - please note that after April 8th, 2024, all labs will be drawn in the cancer center.  You do not have to check in or register with the main entrance as you have in the past but will complete your check-in in the cancer center.  Wear comfortable clothing and clothing appropriate for easy access to any Portacath or PICC line.   We strive to give you quality time with your provider. You may need to reschedule your appointment if you arrive late (15 or more minutes).  Arriving late affects you and other patients whose appointments are after yours.  Also, if you miss three or more appointments without notifying the office, you may be dismissed from the clinic at the provider's discretion.      For prescription refill requests, have your pharmacy contact our office and allow 72 hours for refills to be completed.    Today you received the following therapeutic phlebotomy      BELOW ARE SYMPTOMS THAT SHOULD BE REPORTED IMMEDIATELY: *FEVER GREATER THAN 100.4 F (38 C) OR HIGHER *CHILLS OR SWEATING *NAUSEA AND VOMITING THAT IS NOT CONTROLLED WITH YOUR NAUSEA MEDICATION *UNUSUAL SHORTNESS OF BREATH *UNUSUAL BRUISING OR BLEEDING *URINARY PROBLEMS (pain or burning when urinating, or frequent urination) *BOWEL PROBLEMS (unusual diarrhea, constipation, pain near the anus) TENDERNESS IN MOUTH AND THROAT WITH OR WITHOUT PRESENCE OF ULCERS (sore throat, sores in mouth, or a toothache) UNUSUAL RASH, SWELLING OR PAIN  UNUSUAL VAGINAL DISCHARGE OR ITCHING   Items with * indicate a potential emergency and should be followed up as soon as possible or go to the Emergency Department if any problems should occur.  Please show the CHEMOTHERAPY ALERT CARD or IMMUNOTHERAPY  ALERT CARD at check-in to the Emergency Department and triage nurse.  Should you have questions after your visit or need to cancel or reschedule your appointment, please contact [facility] [place] - A DEPT OF [facility] [phone]  and follow the prompts.  Office hours are 8:00 a.m. to 4:30 p.m. Monday - Friday. Please note that voicemails left after 4:00 p.m. may not be returned until the following business day.  We are closed weekends and major holidays. You have access to a nurse at all times for urgent questions. Please call the main number to the clinic [phone] and follow the prompts.  For any non-urgent questions, you may also contact your provider using MyChart. We now offer e-Visits for anyone 31 and older to request care online for non-urgent symptoms. For details visit [email or web address].   Also download the MyChart app! Go to the app store, search MyChart, open the app, select [facility], and log in with your MyChart username and password.

## 2024-07-14 NOTE — Progress Notes (Signed)
 Ziya M Shepperson presents today for theraputic phlebotomy per MD orders. Last hgb 16.2/hct 49.2 from [facility] on 07/06/24. VSS prior to procedure. Pt reports eating before arrival. Procedure started at 1315 using patients left AC. 360 mL of blood removed. Patient stated she only wanted (12 oz ) of blood removed to due to having a girl's night out patient's words. Procedure ended at 1320. Gauze and coban applied to [facility], site clean and dry. VSS upon completion of procedure. Pt denies dizziness, lightheadedness, or feeling faint. Patient did not want to wait the 30 minute post wait time per policy. Discharged in satisfactory condition with follow up instructions.

## 2024-07-18 ENCOUNTER — Ambulatory Visit: Payer: Self-pay | Admitting: Allergy & Immunology

## 2024-07-25 ENCOUNTER — Ambulatory Visit: Admitting: Professional

## 2024-08-09 ENCOUNTER — Ambulatory Visit: Admitting: Professional

## 2024-08-31 NOTE — Addendum Note (Signed)
 Addended by: FERDIE FINE B on: 08/31/2024 08:11 AM   Modules accepted: Orders

## 2024-09-01 ENCOUNTER — Inpatient Hospital Stay: Attending: Hematology

## 2024-09-01 ENCOUNTER — Inpatient Hospital Stay

## 2024-09-01 DIAGNOSIS — F17210 Nicotine dependence, cigarettes, uncomplicated: Secondary | ICD-10-CM | POA: Diagnosis not present

## 2024-09-01 DIAGNOSIS — Z720 Tobacco use: Secondary | ICD-10-CM

## 2024-09-01 DIAGNOSIS — D582 Other hemoglobinopathies: Secondary | ICD-10-CM

## 2024-09-01 DIAGNOSIS — D751 Secondary polycythemia: Secondary | ICD-10-CM | POA: Diagnosis not present

## 2024-09-01 DIAGNOSIS — D509 Iron deficiency anemia, unspecified: Secondary | ICD-10-CM

## 2024-09-01 LAB — CBC WITH DIFFERENTIAL/PLATELET
Abs Immature Granulocytes: 0.02 K/uL (ref 0.00–0.07)
Basophils Absolute: 0.1 K/uL (ref 0.0–0.1)
Basophils Relative: 1 %
Eosinophils Absolute: 0.2 K/uL (ref 0.0–0.5)
Eosinophils Relative: 3 %
HCT: 50.5 % — ABNORMAL HIGH (ref 36.0–46.0)
Hemoglobin: 16.5 g/dL — ABNORMAL HIGH (ref 12.0–15.0)
Immature Granulocytes: 0 %
Lymphocytes Relative: 25 %
Lymphs Abs: 2 K/uL (ref 0.7–4.0)
MCH: 31 pg (ref 26.0–34.0)
MCHC: 32.7 g/dL (ref 30.0–36.0)
MCV: 94.7 fL (ref 80.0–100.0)
Monocytes Absolute: 0.8 K/uL (ref 0.1–1.0)
Monocytes Relative: 10 %
Neutro Abs: 5 K/uL (ref 1.7–7.7)
Neutrophils Relative %: 61 %
Platelets: 165 K/uL (ref 150–400)
RBC: 5.33 MIL/uL — ABNORMAL HIGH (ref 3.87–5.11)
RDW: 15.7 % — ABNORMAL HIGH (ref 11.5–15.5)
WBC: 8.1 K/uL (ref 4.0–10.5)
nRBC: 0 % (ref 0.0–0.2)

## 2024-09-01 NOTE — Progress Notes (Signed)
 Lindsey Jensen presents today for phlebotomy per MD orders. Pt HCT 50.4. Per NP patient may have phlebotomy if she would like. Per pt she would like to proceed with phlebotomy.   Phlebotomy procedure started at 1304 and ended at 1310 15 oz removed per pt request. Patient tolerated procedure well. IV needle removed intact. Pt denies any symptoms or complications. Pt denies to wait the recommended 30 minutes post phlebotomy. VSS. Pt stable at discharge.   [name]

## 2024-09-12 ENCOUNTER — Ambulatory Visit (INDEPENDENT_AMBULATORY_CARE_PROVIDER_SITE_OTHER): Payer: Self-pay | Admitting: Internal Medicine

## 2024-09-12 ENCOUNTER — Encounter: Payer: Self-pay | Admitting: Internal Medicine

## 2024-09-12 VITALS — BP 137/86 | HR 94 | Ht 68.0 in | Wt 193.8 lb

## 2024-09-12 DIAGNOSIS — G250 Essential tremor: Secondary | ICD-10-CM | POA: Insufficient documentation

## 2024-09-12 DIAGNOSIS — R739 Hyperglycemia, unspecified: Secondary | ICD-10-CM | POA: Diagnosis not present

## 2024-09-12 DIAGNOSIS — F411 Generalized anxiety disorder: Secondary | ICD-10-CM

## 2024-09-12 DIAGNOSIS — Z0001 Encounter for general adult medical examination with abnormal findings: Secondary | ICD-10-CM

## 2024-09-12 DIAGNOSIS — E785 Hyperlipidemia, unspecified: Secondary | ICD-10-CM | POA: Diagnosis not present

## 2024-09-12 DIAGNOSIS — J3089 Other allergic rhinitis: Secondary | ICD-10-CM

## 2024-09-12 DIAGNOSIS — D751 Secondary polycythemia: Secondary | ICD-10-CM

## 2024-09-12 DIAGNOSIS — E559 Vitamin D deficiency, unspecified: Secondary | ICD-10-CM

## 2024-09-12 DIAGNOSIS — Z720 Tobacco use: Secondary | ICD-10-CM

## 2024-09-12 DIAGNOSIS — J302 Other seasonal allergic rhinitis: Secondary | ICD-10-CM

## 2024-09-12 NOTE — Assessment & Plan Note (Signed)
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

## 2024-09-12 NOTE — Assessment & Plan Note (Signed)
Well controlled with azelastine nasal spray and Allegra ?Uses Xopenex as needed for dyspnea ?

## 2024-09-12 NOTE — Assessment & Plan Note (Signed)
 Her left hand tremors are most likely essential tremors Does not affect her functional status If worsens, can consider propranolol

## 2024-09-12 NOTE — Assessment & Plan Note (Signed)
 Physical exam as documented. Fasting blood tests today - recent CBC reviewed. PAP smear with Ob/Gyn.

## 2024-09-12 NOTE — Progress Notes (Signed)
 Established Patient Office Visit  Subjective:  Patient ID: Lindsey Jensen, female    DOB: 08-09-1986  Age: 38 y.o. MRN: [id number]  CC:  Chief Complaint  Patient presents with   Annual Exam    HPI Lindsey Jensen is a 38 y.o. female with past medical history of allergic rhinitis, asthma, erythrocytosis, GAD and tobacco abuse who presents for annual physical.  She uses azelastine  nasal spray and Allegra  for allergic rhinitis.  She also uses Xopenex  as needed for dyspnea.  She has had allergy  and immunology eval and pulmonology eval for these.  She also has some component of GAD, contributing to her dyspnea.  She states that her anxiety symptoms are better controlled currently. She has a Chief Strategy Officer for it.  She has history of erythrocytosis, for which she follows up with heme-onc.  She does regular venipuncture.  She still smokes about 0.75 pack/day.   She has left hand tremors, which are chronic, worse with movement.  She is right-handed and states that her left hand tremor does not affect her functional status.  Past Medical History:  Diagnosis Date   Allergy     Phreesia 05/26/2020   Anxiety    Phreesia 05/26/2020   ASCUS of cervix with negative high risk HPV 07/29/2021   07/28/21 repeat in 3 years per [facility], 5 year risk for CIN3+ is 0.40%   Asthma    Phreesia 05/26/2020   Contraceptive education 01/26/2014   Erythrocytosis    Medical history non-contributory    Moderate persistent asthma, uncomplicated 05/19/2019   Multiple allergies 06/11/2020    Past Surgical History:  Procedure Laterality Date   NO PAST SURGERIES      Family History  Problem Relation Age of Onset   Heart disease Father        heart attack   Allergic rhinitis Father    Diabetes Mother    Depression Mother    Anxiety disorder Mother    Heart disease Mother    Heart disease Maternal Grandmother        CHF   Asthma Maternal Grandmother    Diabetes Maternal Grandmother    High Cholesterol Sister     Kidney Stones Brother    Alzheimer's disease Maternal Grandfather    Asthma Maternal Grandfather    Diabetes Maternal Uncle    Angioedema Neg Hx    Immunodeficiency Neg Hx     Social History   Socioeconomic History   Marital status: Married    Spouse name: Not on file   Number of children: 1   Years of education: Not on file   Highest education level: Bachelor's degree (e.g., BA, AB, BS)  Occupational History    Employer: Airline pilot  Tobacco Use   Smoking status: Every Day    Current packs/day: 0.50    Average packs/day: 0.5 packs/day for 14.0 years (7.0 ttl pk-yrs)    Types: Cigarettes   Smokeless tobacco: Never  Vaping Use   Vaping status: Never Used  Substance and Sexual Activity   Alcohol use: Yes    Alcohol/week: 14.0 standard drinks of alcohol    Types: 14 Glasses of wine per week    Comment: glass of wine every night     Drug use: No   Sexual activity: Yes    Birth control/protection: Other-see comments    Comment: partner has vasectomy  Other Topics Concern   Not on file  Social History Narrative   Lives with husband and son  Dog: Pitbull: Lana       Enjoys: reading-reformed theology; fiction; drink wine, buy shoes, playing cards      Diet: eats all food groups outside of meat-some chicken at times   Caffeine: 3 cups of expresso daily    Water: 24 oz x 3 bottles daily       Wears seat belt   Does not use phone while driving   Multimedia programmer in a lock box   Social Drivers of Health   Financial Resource Strain: Low Risk  (07/21/2021)   Overall Financial Resource Strain (CARDIA)    Difficulty of Paying Living Expenses: Not very hard  Food Insecurity: No Food Insecurity (07/21/2021)   Hunger Vital Sign    Worried About Running Out of Food in the Last Year: Never true    Ran Out of Food in the Last Year: Never true  Transportation Needs: No Transportation Needs (07/21/2021)   PRAPARE - Scientist, research (physical sciences) (Medical): No    Lack of Transportation (Non-Medical): No  Physical Activity: Insufficiently Active (07/21/2021)   Exercise Vital Sign    Days of Exercise per Week: 3 days    Minutes of Exercise per Session: 30 min  Stress: Stress Concern Present (07/21/2021)   [employer] of Occupational Health - Occupational Stress Questionnaire    Feeling of Stress : Very much  Social Connections: Moderately Isolated (07/21/2021)   Social Connection and Isolation Panel    Frequency of Communication with Friends and Family: More than three times a week    Frequency of Social Gatherings with Friends and Family: Once a week    Attends Religious Services: Never    Database administrator or Organizations: No    Attends Banker Meetings: Never    Marital Status: Married  Catering manager Violence: Not At Risk (07/21/2021)   Humiliation, Afraid, Rape, and Kick questionnaire    Fear of Current or Ex-Partner: No    Emotionally Abused: No    Physically Abused: No    Sexually Abused: No    Outpatient Medications Prior to Visit  Medication Sig Dispense Refill   azelastine  (ASTELIN ) 0.1 % nasal spray Place 2 sprays into both nostrils 2 (two) times daily. 90 mL 3   Bepotastine  Besilate 1.5 % SOLN Place 2 drops into both eyes 2 (two) times daily as needed. 10 mL 5   clindamycin  (CLINDAGEL) 1 % gel APPLY TO AFFECTED AREA TWICE A DAY 30 g 1   EPINEPHrine  (AUVI-Q ) 0.3 mg/0.3 mL IJ SOAJ injection Inject 0.3 mg into the muscle as needed for anaphylaxis. 1 each 1   fexofenadine  (ALLEGRA ) 180 MG tablet Take 1 tablet (180 mg total) by mouth daily. 90 tablet 3   fluticasone  (FLONASE ) 50 MCG/ACT nasal spray SPRAY 2 SPRAYS INTO EACH NOSTRIL EVERY DAY 48 mL 3   levalbuterol  (XOPENEX  HFA) 45 MCG/ACT inhaler TAKE 2 PUFFS BY MOUTH EVERY 6 HOURS AS NEEDED FOR WHEEZE OR SHORTNESS OF BREATH 15 g 1   No facility-administered medications prior to visit.    Allergies  Allergen Reactions    Beef-Derived Drug Products Anaphylaxis    Alpha-Gal Allergy     Meat [Alpha-Gal] Palpitations    All mammal. Symptom asthma .   Pork-Derived Products Anaphylaxis    Alpha-Gal Allergy    Shellfish Allergy  Other (See Comments)    SOB   Other Rash and Itching    ROS Review of Systems  Constitutional:  Negative for  chills and fever.  HENT:  Negative for congestion, sinus pressure, sinus pain and sore throat.   Eyes:  Negative for pain and discharge.  Respiratory:  Negative for cough and shortness of breath.   Cardiovascular:  Negative for chest pain and palpitations.  Gastrointestinal:  Negative for abdominal pain, diarrhea, nausea and vomiting.  Endocrine: Negative for polydipsia and polyuria.  Genitourinary:  Negative for dysuria and hematuria.  Musculoskeletal:  Negative for neck pain and neck stiffness.  Skin:  Negative for rash.  Neurological:  Positive for tremors. Negative for dizziness and weakness.  Psychiatric/Behavioral:  Negative for agitation and behavioral problems. The patient is nervous/anxious.       Objective:    Physical Exam Vitals reviewed.  Constitutional:      General: She is not in acute distress.    Appearance: She is not diaphoretic.  HENT:     Head: Normocephalic and atraumatic.     Nose: Nose normal. No congestion.     Mouth/Throat:     Mouth: Mucous membranes are moist.     Pharynx: No posterior oropharyngeal erythema.  Eyes:     General: No scleral icterus.    Extraocular Movements: Extraocular movements intact.  Cardiovascular:     Rate and Rhythm: Normal rate and regular rhythm.     Heart sounds: Normal heart sounds. No murmur heard. Pulmonary:     Breath sounds: Normal breath sounds. No wheezing or rales.  Abdominal:     Palpations: Abdomen is soft.     Tenderness: There is no abdominal tenderness.  Musculoskeletal:     Cervical back: Neck supple. No tenderness.     Right lower leg: No edema.     Left lower leg: No edema.  Skin:     General: Skin is warm.     Findings: No rash.  Neurological:     General: No focal deficit present.     Mental Status: She is alert and oriented to person, place, and time.     Cranial Nerves: No cranial nerve deficit.     Sensory: No sensory deficit.     Motor: Tremor (Left hand fine tremors) present. No weakness.  Psychiatric:        Mood and Affect: Mood normal.        Behavior: Behavior normal.     BP 137/86   Pulse 94   Ht 5' 8 (1.727 m)   Wt 193 lb 12.8 oz (87.9 kg)   SpO2 98%   BMI 29.47 kg/m  Wt Readings from Last 3 Encounters:  09/12/24 193 lb 12.8 oz (87.9 kg)  01/26/24 199 lb 8 oz (90.5 kg)  01/07/24 194 lb 4.8 oz (88.1 kg)    Lab Results  Component Value Date   TSH 2.570 04/30/2022   Lab Results  Component Value Date   WBC 8.1 09/01/2024   HGB 16.5 (H) 09/01/2024   HCT 50.5 (H) 09/01/2024   MCV 94.7 09/01/2024   PLT 165 09/01/2024   Lab Results  Component Value Date   NA 133 (L) 01/07/2024   K 4.1 01/07/2024   CO2 26 01/07/2024   GLUCOSE 95 01/07/2024   BUN 13 01/07/2024   CREATININE 0.82 01/07/2024   BILITOT 0.6 01/07/2024   ALKPHOS 61 01/07/2024   AST 15 01/07/2024   ALT 16 01/07/2024   PROT 7.0 01/07/2024   ALBUMIN 3.7 01/07/2024   CALCIUM 8.6 (L) 01/07/2024   ANIONGAP 7 01/07/2024   EGFR 97 04/29/2021   Lab Results  Component Value Date   CHOL 138 04/30/2022   Lab Results  Component Value Date   HDL 42 04/30/2022   Lab Results  Component Value Date   LDLCALC 75 04/30/2022   Lab Results  Component Value Date   TRIG 113 04/30/2022   Lab Results  Component Value Date   CHOLHDL 3.3 04/30/2022   Lab Results  Component Value Date   HGBA1C 5.0 04/30/2022      Assessment & Plan:   Problem List Items Addressed This Visit       Respiratory   Seasonal and perennial allergic rhinitis   Well controlled with azelastine  nasal spray and Allegra  Uses Xopenex  as needed for dyspnea        Nervous and Auditory   Essential  tremor   Her left hand tremors are most likely essential tremors Does not affect her functional status If worsens, can consider propranolol        Other   Tobacco abuse   Smokes about 0.75 pack/day  Asked about quitting: confirms that he/she currently smokes cigarettes Advise to quit smoking: Educated about QUITTING to reduce the risk of cancer, cardio and cerebrovascular disease. Assess willingness: Unwilling to quit at this time, but is working on cutting back.      Erythrocytosis   JAK2 negative Followed by heme-onc Does regular venipuncture Last CBC reviewed Erythema over b/l hands likely due to chronic erythrocytosis      Encounter for general adult medical examination with abnormal findings - Primary   Physical exam as documented. Fasting blood tests today - recent CBC reviewed. PAP smear with Ob/Gyn.      GAD (generalized anxiety disorder)   Followed by [facility] therapy currently Considering her episodes of severe anxiety/panic episode, offered hydroxyzine, but she prefers to avoid any medicine for now Check CBC, CMP, TSH      Relevant Orders   TSH + free T4   Other Visit Diagnoses       Hyperglycemia       Relevant Orders   CMP14+EGFR   Hemoglobin A1c     Hyperlipidemia, unspecified hyperlipidemia type       Relevant Orders   Lipid Profile     Vitamin D  deficiency       Relevant Orders   Vitamin D  (25 hydroxy)       No orders of the defined types were placed in this encounter.   Follow-up: Return in about 1 year (around 09/12/2025).    Suzzane MARLA Blanch, MD

## 2024-09-12 NOTE — Assessment & Plan Note (Signed)
 Smokes about 0.75 pack/day  Asked about quitting: confirms that he/she currently smokes cigarettes Advise to quit smoking: Educated about QUITTING to reduce the risk of cancer, cardio and cerebrovascular disease. Assess willingness: Unwilling to quit at this time, but is working on cutting back.

## 2024-09-12 NOTE — Patient Instructions (Signed)
 Please continue to take medications as prescribed.  Please continue to follow low carb diet and perform moderate exercise/walking at least 150 mins/week.

## 2024-09-12 NOTE — Assessment & Plan Note (Signed)
 Followed by [facility] therapy currently Considering her episodes of severe anxiety/panic episode, offered hydroxyzine, but she prefers to avoid any medicine for now Check CBC, CMP, TSH

## 2024-09-18 ENCOUNTER — Telehealth: Payer: Self-pay | Admitting: *Deleted

## 2024-09-18 ENCOUNTER — Ambulatory Visit: Payer: Self-pay

## 2024-09-18 NOTE — Telephone Encounter (Signed)
 FYI Only or Action Required?: FYI only for provider.  Patient was last seen in primary care on 09/12/2024 by Lindsey Suzzane POUR, MD.  Called Nurse Triage reporting Chest Pain.   Copied from CRM #[id number]. Topic: Clinical - Red Word Triage >> Sep 18, 2024  9:39 AM Lindsey Jensen wrote: Red Word that prompted transfer to Nurse Triage: chest tightness >> Sep 18, 2024  9:48 AM Lindsey Jensen wrote: Pt disconnected while waiting for NT. She stated she did not think it was an emergency or she would go to ER and wanted to speak with dr patel directly to add blood work on to her upcoming lab appt. Please call pt back   Reason for Disposition  [1] Follow-up call to recent contact AND [2] information only call, no triage required  Answer Assessment - Initial Assessment Questions 1. REASON FOR CALL: What is the main reason for your call? or How can I best help you?     Chest Tightness  2. SYMPTOMS : Do you have any symptoms?      It appears to have eased up yes.  3. OTHER QUESTIONS: Do you have any other questions?     No, not anymore.  Protocols used: Information Only Call - No Triage-A-AH

## 2024-09-18 NOTE — Telephone Encounter (Signed)
 Appt made.

## 2024-09-18 NOTE — Telephone Encounter (Signed)
 Patient called with c/o chest tightness and sob feeling light headed. I advised that she go to the ER to rule out possible PE. She requested that we do labs rather than her going to the ER.  Consulted with [facility] [place], [facility] who agreed with recommendations.  Patient emphatically refused to go to the ER at this time.

## 2024-10-26 ENCOUNTER — Other Ambulatory Visit: Payer: Self-pay | Admitting: *Deleted

## 2024-10-26 DIAGNOSIS — D509 Iron deficiency anemia, unspecified: Secondary | ICD-10-CM

## 2024-10-26 DIAGNOSIS — D751 Secondary polycythemia: Secondary | ICD-10-CM

## 2024-10-26 DIAGNOSIS — E611 Iron deficiency: Secondary | ICD-10-CM

## 2024-10-26 NOTE — Addendum Note (Signed)
 Addended by: Cornelius Marullo A on: 10/26/2024 11:49 AM   Modules accepted: Orders

## 2024-10-27 ENCOUNTER — Inpatient Hospital Stay: Attending: Hematology

## 2024-10-27 ENCOUNTER — Ambulatory Visit: Payer: Self-pay | Admitting: Oncology

## 2024-10-27 ENCOUNTER — Inpatient Hospital Stay

## 2024-10-27 DIAGNOSIS — F17210 Nicotine dependence, cigarettes, uncomplicated: Secondary | ICD-10-CM | POA: Insufficient documentation

## 2024-10-27 DIAGNOSIS — Z720 Tobacco use: Secondary | ICD-10-CM

## 2024-10-27 DIAGNOSIS — D508 Other iron deficiency anemias: Secondary | ICD-10-CM | POA: Insufficient documentation

## 2024-10-27 DIAGNOSIS — D751 Secondary polycythemia: Secondary | ICD-10-CM | POA: Diagnosis not present

## 2024-10-27 DIAGNOSIS — D582 Other hemoglobinopathies: Secondary | ICD-10-CM

## 2024-10-27 DIAGNOSIS — D509 Iron deficiency anemia, unspecified: Secondary | ICD-10-CM

## 2024-10-27 LAB — FERRITIN: Ferritin: 17 ng/mL (ref 11–307)

## 2024-10-27 LAB — COMPREHENSIVE METABOLIC PANEL WITH GFR
ALT: 14 U/L (ref 0–44)
AST: 19 U/L (ref 15–41)
Albumin: 4 g/dL (ref 3.5–5.0)
Alkaline Phosphatase: 73 U/L (ref 38–126)
Anion gap: 8 (ref 5–15)
BUN: 12 mg/dL (ref 6–20)
CO2: 28 mmol/L (ref 22–32)
Calcium: 8.9 mg/dL (ref 8.9–10.3)
Chloride: 101 mmol/L (ref 98–111)
Creatinine, Ser: 0.67 mg/dL (ref 0.44–1.00)
GFR, Estimated: >60 mL/min (ref >60)
Glucose, Bld: 85 mg/dL (ref 70–99)
Potassium: 4.3 mmol/L (ref 3.5–5.1)
Sodium: 138 mmol/L (ref 135–145)
Total Bilirubin: 0.4 mg/dL (ref 0.0–1.2)
Total Protein: 6.8 g/dL (ref 6.5–8.1)

## 2024-10-27 LAB — CBC WITH DIFFERENTIAL/PLATELET
Abs Immature Granulocytes: 0.02 K/uL (ref 0.00–0.07)
Basophils Absolute: 0.1 K/uL (ref 0.0–0.1)
Basophils Relative: 1 %
Eosinophils Absolute: 0.1 K/uL (ref 0.0–0.5)
Eosinophils Relative: 1 %
HCT: 50.5 % — ABNORMAL HIGH (ref 36.0–46.0)
Hemoglobin: 16.4 g/dL — ABNORMAL HIGH (ref 12.0–15.0)
Immature Granulocytes: 0 %
Lymphocytes Relative: 25 %
Lymphs Abs: 1.9 K/uL (ref 0.7–4.0)
MCH: 30.8 pg (ref 26.0–34.0)
MCHC: 32.5 g/dL (ref 30.0–36.0)
MCV: 94.7 fL (ref 80.0–100.0)
Monocytes Absolute: 0.8 K/uL (ref 0.1–1.0)
Monocytes Relative: 11 %
Neutro Abs: 4.9 K/uL (ref 1.7–7.7)
Neutrophils Relative %: 62 %
Platelets: 156 K/uL (ref 150–400)
RBC: 5.33 MIL/uL — ABNORMAL HIGH (ref 3.87–5.11)
RDW: 15.7 % — ABNORMAL HIGH (ref 11.5–15.5)
WBC: 7.9 K/uL (ref 4.0–10.5)
nRBC: 0 % (ref 0.0–0.2)

## 2024-10-27 LAB — IRON AND TIBC
Iron: 55 ug/dL (ref 28–170)
Saturation Ratios: 10 % — ABNORMAL LOW (ref 10.4–31.8)
TIBC: 531 ug/dL — ABNORMAL HIGH (ref 250–450)
UIBC: 476 ug/dL

## 2024-10-27 LAB — LACTATE DEHYDROGENASE: LDH: 191 U/L (ref 98–192)

## 2024-10-27 NOTE — Progress Notes (Signed)
 Lindsey Jensen presents today for phlebotomy per MD orders. Phlebotomy procedure started at 1307 and ended at 1311. 260 grams removed. Patient did not want to stay for her wait time post phleb. . Patient tolerated procedure well. IV needle removed intact.

## 2024-10-30 NOTE — Telephone Encounter (Signed)
 FYI

## 2024-11-02 ENCOUNTER — Inpatient Hospital Stay: Admitting: Oncology

## 2024-11-02 DIAGNOSIS — Z720 Tobacco use: Secondary | ICD-10-CM

## 2024-11-02 DIAGNOSIS — D751 Secondary polycythemia: Secondary | ICD-10-CM

## 2024-11-02 DIAGNOSIS — D509 Iron deficiency anemia, unspecified: Secondary | ICD-10-CM

## 2024-11-02 DIAGNOSIS — D582 Other hemoglobinopathies: Secondary | ICD-10-CM

## 2024-11-03 ENCOUNTER — Inpatient Hospital Stay: Admitting: Oncology

## 2024-11-03 DIAGNOSIS — D508 Other iron deficiency anemias: Secondary | ICD-10-CM

## 2024-11-03 DIAGNOSIS — F17210 Nicotine dependence, cigarettes, uncomplicated: Secondary | ICD-10-CM | POA: Diagnosis not present

## 2024-11-03 DIAGNOSIS — D751 Secondary polycythemia: Secondary | ICD-10-CM | POA: Diagnosis not present

## 2024-11-03 DIAGNOSIS — D509 Iron deficiency anemia, unspecified: Secondary | ICD-10-CM | POA: Insufficient documentation

## 2024-11-03 NOTE — Progress Notes (Signed)
 [facility] OFFICE PROGRESS NOTE  Tobie Suzzane POUR, MD  ASSESSMENT & PLAN:   I connected with Lindsey Jensen on 11/03/24 at  9:30 AM EST by telephone visit and verified that I am speaking with the correct person using two identifiers.   I discussed the limitations, risks, security and privacy concerns of performing an evaluation and management service by telemedicine and the availability of in-person appointments. I also discussed with the patient that there may be a patient responsible charge related to this service. The patient expressed understanding and agreed to proceed.   Other persons participating in the visit and their role in the encounter: NP, Patient   Patient's location: Home Provider's location: Clinic   Assessment & Plan Erythrocytosis - Would recommend therapeutic phlebotomy to maintain HCT <54.0 or for treatment of vasomotor symptoms. - Most recent labs from 10/24/24 show a hemoglobin of 16.4/hematocrit 50.5 with platelets of 156.  LDH is normal. -She last received a phlebotomy on 10/27/2024 but only half about 250 mL. - Continue CBC + phlebotomy every 8 weeks.  RTC in 6 months. - No indication for aspirin. - She continues to smoke cigarettes and will try to decrease the amount of cigarettes she is smoking by half in the next 6 months. 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  Treatment of iron deficiency in patients with secondary erythrocytosis seems to replenish iron stores safely, with transferrin saturation less than 20% below treatment threshold. - Iron levels from 10/27/2024 show iron saturation 10% with an elevated TIBC.  Ferritin  is 17 which is an improvement from previous. -She is currently not taking oral iron tablets. -We discussed starting iron tabs, Flintstone vitamins or Gummies every other day.  -Will recheck levels in 6 months.  Orders Placed This Encounter  Procedures   CBC with Differential/Platelet    Standing Status:   Future    Expected Date:   05/03/2025    Expiration Date:   08/01/2025    Release to patient:   Immediate   Ferritin    Standing Status:   Future    Expected Date:   05/03/2025    Expiration Date:   08/01/2025   Lactate dehydrogenase    Standing Status:   Future    Expected Date:   05/03/2025    Expiration Date:   08/01/2025   Iron and TIBC ([facility] ONLY)    Standing Status:   Future    Expected Date:   05/03/2025    Expiration Date:   08/01/2025   Comprehensive metabolic panel    Standing Status:   Future    Expected Date:   05/03/2025    Expiration Date:   08/01/2025   CBC    Standing Status:   Standing    Number of Occurrences:   6    Expiration Date:   11/03/2025    INTERVAL HISTORY: Patient returns for follow-up for secondary erythrocytosis.  Patient recently had therapeutic phlebotomy last on 10/27/2024 where 250 mL was removed.  Overall, patient reports she is doing well.  Denies any interval hospitalizations, surgeries or changes to baseline health.  She tolerates phlebotomies well except for some anxiety. Reports she  has high blood pressure and having phlebotomies makes her anxious.   Reports she smokes on average 0.75 packs/day but occasionally will smoke less and occasionally will smoke more based on situational circumstances. She denies the use of diuretics or hormone supplements.  Denies history of sleep apnea and does not believe she snores.  She denies any carbon monoxide exposure or underlying cardiopulmonary disease. She does not have any other cardiac risk factors such as hypertension, hyperlipidemia, or diabetes.  No family history of MPN.    Reports she recently had allergy  testing completed which showed she is allergic to dog dander.  We reviewed CBC, ferritin, LDH, iron panel and CMP.  Hey  SUMMARY OF HEMATOLOGIC HISTORY: Oncology History Overview Note  - Intermittently elevated hemoglobin since 2014, with persistent elevations since October 2019 - Hematology workup revealed NEGATIVE mutational workup for JAK2, CALR, and MPL.  She had elevated carboxyhemoglobin at 4.2.  Erythropoietin  was 12.7 (normal). - Echo on 03/05/2020 shows EF 60 to 65% with normal function.  Atria were normal. - Ultrasound of the abdomen on 04/05/2020 did not show any cystic lesions in the liver or kidneys.  Spleen size was normal. - Current everyday smoker, 0.75 PPD cigarettes - No diuretics, hormone supplements, cardiopulmonary disease, or carbon monoxide exposure.  No family history of MPN. - No history of sleep apnea, but reports to mild snoring, not feeling rested in the mornings, and feeling sleepy throughout the day - Symptomatic with intermittent erythromelalgia, rare tinnitus and occasional blurry vision.  No B symptoms or symptoms of splenomegaly. - No history of DVT or PE. - She has been receiving therapeutic phlebotomy every 2-3 months (most recently on 05/11/24) for symptom relief.   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  Unable to rule out bone marrow disorder at this time, as patient has declined bone marrow biopsy in the past. - Discussed with patient that primary treatment of secondary throat cytosis is aimed at causative factor, in this case smoking cessation.   2.  Iron deficiency - No rectal bleeding or melena.  Periods are normal. - She has been receiving periodic phlebotomy for her erythrocytosis. - Ferritin was 10 (10/05/2022) with 10% iron saturation/TIBC 549 (11/08/2020) - Etiology of iron deficiency is most likely regular blood loss (therapeutic phlebotomy, menstrual bleeding) and  increased iron demand (overproduction of RBCs, which is seen in both primary and secondary polycythemia).   3.  Tobacco use: - She is continuing to smoke 0.75 PPD cigarettes per day.    No history exists.     CBC    Component Value Date/Time   WBC 7.9 10/27/2024 1226   RBC 5.33 (H) 10/27/2024 1226   HGB 16.4 (H) 10/27/2024 1226   HGB 20.1 (HH) 04/29/2021 0850   HCT 50.5 (H) 10/27/2024 1226   HCT 58.5 (H) 04/29/2021 0850   PLT 156 10/27/2024 1226   PLT 137 (L) 04/29/2021 0850   MCV 94.7 10/27/2024 1226   MCV 95 04/29/2021 0850   MCH 30.8 10/27/2024 1226   MCHC 32.5 10/27/2024 1226   RDW 15.7 (H) 10/27/2024 1226   RDW 14.2 04/29/2021 0850   LYMPHSABS 1.9 10/27/2024 1226   LYMPHSABS 1.6 04/29/2021 0850   MONOABS 0.8 10/27/2024 1226   EOSABS 0.1 10/27/2024 1226   EOSABS 0.2 04/29/2021 0850   BASOSABS 0.1 10/27/2024 1226   BASOSABS 0.1 04/29/2021 0850       Latest Ref Rng & Units 10/27/2024   12:26 PM 01/07/2024  12:29 PM 04/02/2022    3:28 PM  CMP  Glucose 70 - 99 mg/dL 85  95  98   BUN 6 - 20 mg/dL 12  13  14    Creatinine 0.44 - 1.00 mg/dL 9.32  9.17  9.32   Sodium 135 - 145 mmol/L 138  133  136   Potassium 3.5 - 5.1 mmol/L 4.3  4.1  4.2   Chloride 98 - 111 mmol/L 101  100  104   CO2 22 - 32 mmol/L 28  26  27    Calcium 8.9 - 10.3 mg/dL 8.9  8.6  8.9   Total Protein 6.5 - 8.1 g/dL 6.8  7.0  7.3   Total Bilirubin 0.0 - 1.2 mg/dL 0.4  0.6  0.7   Alkaline Phos 38 - 126 U/L 73  61  66   AST 15 - 41 U/L 19  15  16    ALT 0 - 44 U/L 14  16  16       Lab Results  Component Value Date   FERRITIN 17 10/27/2024   VITAMINB12 364 10/05/2022    There were no vitals filed for this visit.  Review of System:  Review of Systems  Constitutional:  Positive for malaise/fatigue.  Psychiatric/Behavioral:  The patient is nervous/anxious.     Physical Exam: Physical Exam Neurological:     Mental Status: She is alert and oriented to person, place, and time.      I provided  18 minutes of non face-to-face telephone visit time during this encounter, and > 50% was spent counseling as documented under my assessment & plan.   Delon Hope, NP 11/03/2024 9:15 AM

## 2024-11-03 NOTE — Assessment & Plan Note (Addendum)
-   Administering iron to patient with secondary erythrocytosis who also has iron deficiency due to increased demand for RBC production does not inherently make erythrocytosis worse, but careful monitoring is key.  Treatment of iron deficiency in patients with secondary erythrocytosis seems to replenish iron stores safely, with transferrin saturation less than 20% below treatment threshold. - Iron levels from 10/27/2024 show iron saturation 10% with an elevated TIBC.  Ferritin is 17 which is an improvement from previous. -She is currently not taking oral iron tablets. -We discussed starting iron tabs, Flintstone vitamins or Gummies every other day.  -Will recheck levels in 6 months.

## 2024-11-03 NOTE — Assessment & Plan Note (Addendum)
-   Would recommend therapeutic phlebotomy to maintain HCT <54.0 or for treatment of vasomotor symptoms. - Most recent labs from 10/24/24 show a hemoglobin of 16.4/hematocrit 50.5 with platelets of 156.  LDH is normal. -She last received a phlebotomy on 10/27/2024 but only half about 250 mL. - Continue CBC + phlebotomy every 8 weeks.  RTC in 6 months. - No indication for aspirin. - She continues to smoke cigarettes and will try to decrease the amount of cigarettes she is smoking by half in the next 6 months. -Discussed sleep study if numbers continually trend up or if she continues to have elevated hemoglobin 3 to 4 months after smoking

## 2024-12-18 ENCOUNTER — Encounter: Payer: Self-pay | Admitting: *Deleted

## 2024-12-29 ENCOUNTER — Inpatient Hospital Stay: Attending: Hematology

## 2024-12-29 DIAGNOSIS — D751 Secondary polycythemia: Secondary | ICD-10-CM | POA: Diagnosis present

## 2024-12-29 LAB — CBC
HCT: 52.5 % — ABNORMAL HIGH (ref 36.0–46.0)
Hemoglobin: 17.2 g/dL — ABNORMAL HIGH (ref 12.0–15.0)
MCH: 31.3 pg (ref 26.0–34.0)
MCHC: 32.8 g/dL (ref 30.0–36.0)
MCV: 95.6 fL (ref 80.0–100.0)
Platelets: 184 K/uL (ref 150–400)
RBC: 5.49 MIL/uL — ABNORMAL HIGH (ref 3.87–5.11)
RDW: 15.7 % — ABNORMAL HIGH (ref 11.5–15.5)
WBC: 7.6 K/uL (ref 4.0–10.5)
nRBC: 0 % (ref 0.0–0.2)

## 2024-12-29 NOTE — Progress Notes (Signed)
 Lindsey Jensen presents today for theraputic phlebotomy per MD orders. Last hgb 17.2/hct 52.5 was on 12/29/24 VSS prior to procedure. Pt reports eating before arrival. Procedure started at 1346 using patients left AC. 500 mL of blood removed. Procedure ended at 1352. Gauze and coban applied to [facility], site clean and dry. VSS upon completion of procedure. Pt denies dizziness, lightheadedness, or feeling faint. Patient did not wait post 30 minute wait time. Discharged in satisfactory condition with follow up instructions.

## 2025-01-26 ENCOUNTER — Encounter: Payer: Self-pay | Admitting: Oncology

## 2025-02-02 ENCOUNTER — Ambulatory Visit: Payer: BC Managed Care – PPO | Admitting: Allergy & Immunology

## 2025-02-23 ENCOUNTER — Inpatient Hospital Stay: Attending: Hematology

## 2025-02-23 ENCOUNTER — Inpatient Hospital Stay

## 2025-03-01 ENCOUNTER — Ambulatory Visit: Admitting: Women's Health

## 2025-04-20 ENCOUNTER — Inpatient Hospital Stay

## 2025-04-20 ENCOUNTER — Inpatient Hospital Stay: Attending: Hematology | Admitting: Oncology

## 2025-09-18 ENCOUNTER — Encounter: Admitting: Internal Medicine
